# Patient Record
Sex: Male | Born: 1980 | Race: White | Hispanic: No | Marital: Single | Smoking: Current every day smoker
Health system: Southern US, Community
[De-identification: ages and names within clinical notes are randomized; demographics above are authoritative.]

## PROBLEM LIST (undated history)

## (undated) DIAGNOSIS — F319 Bipolar disorder, unspecified: Secondary | ICD-10-CM

## (undated) DIAGNOSIS — K859 Acute pancreatitis without necrosis or infection, unspecified: Secondary | ICD-10-CM

## (undated) DIAGNOSIS — F10231 Alcohol dependence with withdrawal delirium: Secondary | ICD-10-CM

## (undated) DIAGNOSIS — F10931 Alcohol use, unspecified with withdrawal delirium: Secondary | ICD-10-CM

## (undated) DIAGNOSIS — F1010 Alcohol abuse, uncomplicated: Secondary | ICD-10-CM

## (undated) DIAGNOSIS — F419 Anxiety disorder, unspecified: Secondary | ICD-10-CM

## (undated) HISTORY — PX: OTHER SURGICAL HISTORY: SHX169

---

## 2003-11-19 ENCOUNTER — Ambulatory Visit: Payer: Self-pay | Admitting: Internal Medicine

## 2006-09-17 ENCOUNTER — Emergency Department: Payer: Self-pay | Admitting: Unknown Physician Specialty

## 2014-07-31 ENCOUNTER — Inpatient Hospital Stay (HOSPITAL_COMMUNITY): Payer: MEDICAID

## 2014-07-31 ENCOUNTER — Emergency Department (HOSPITAL_COMMUNITY): Payer: Self-pay

## 2014-07-31 ENCOUNTER — Encounter (HOSPITAL_COMMUNITY): Payer: Self-pay | Admitting: Emergency Medicine

## 2014-07-31 ENCOUNTER — Inpatient Hospital Stay (HOSPITAL_COMMUNITY)
Admission: EM | Admit: 2014-07-31 | Discharge: 2014-08-07 | DRG: 438 | Disposition: A | Discharge status: Home / Self Care | Payer: Self-pay | Attending: Internal Medicine | Admitting: Internal Medicine

## 2014-07-31 DIAGNOSIS — F10939 Alcohol use, unspecified with withdrawal, unspecified: Secondary | ICD-10-CM | POA: Diagnosis present

## 2014-07-31 DIAGNOSIS — K852 Alcohol induced acute pancreatitis without necrosis or infection: Secondary | ICD-10-CM | POA: Diagnosis present

## 2014-07-31 DIAGNOSIS — J690 Pneumonitis due to inhalation of food and vomit: Secondary | ICD-10-CM | POA: Diagnosis present

## 2014-07-31 DIAGNOSIS — K859 Acute pancreatitis without necrosis or infection, unspecified: Secondary | ICD-10-CM

## 2014-07-31 DIAGNOSIS — K7030 Alcoholic cirrhosis of liver without ascites: Secondary | ICD-10-CM | POA: Diagnosis present

## 2014-07-31 DIAGNOSIS — R109 Unspecified abdominal pain: Secondary | ICD-10-CM

## 2014-07-31 DIAGNOSIS — E876 Hypokalemia: Secondary | ICD-10-CM | POA: Diagnosis not present

## 2014-07-31 DIAGNOSIS — F10239 Alcohol dependence with withdrawal, unspecified: Secondary | ICD-10-CM | POA: Diagnosis present

## 2014-07-31 DIAGNOSIS — J90 Pleural effusion, not elsewhere classified: Secondary | ICD-10-CM

## 2014-07-31 DIAGNOSIS — K567 Ileus, unspecified: Secondary | ICD-10-CM | POA: Diagnosis not present

## 2014-07-31 DIAGNOSIS — E872 Acidosis: Secondary | ICD-10-CM | POA: Diagnosis not present

## 2014-07-31 DIAGNOSIS — K625 Hemorrhage of anus and rectum: Secondary | ICD-10-CM

## 2014-07-31 DIAGNOSIS — F10231 Alcohol dependence with withdrawal delirium: Secondary | ICD-10-CM | POA: Diagnosis present

## 2014-07-31 DIAGNOSIS — R509 Fever, unspecified: Secondary | ICD-10-CM

## 2014-07-31 DIAGNOSIS — D649 Anemia, unspecified: Secondary | ICD-10-CM | POA: Diagnosis present

## 2014-07-31 DIAGNOSIS — D6959 Other secondary thrombocytopenia: Secondary | ICD-10-CM | POA: Diagnosis present

## 2014-07-31 DIAGNOSIS — R140 Abdominal distension (gaseous): Secondary | ICD-10-CM

## 2014-07-31 DIAGNOSIS — I10 Essential (primary) hypertension: Secondary | ICD-10-CM | POA: Diagnosis present

## 2014-07-31 DIAGNOSIS — R739 Hyperglycemia, unspecified: Secondary | ICD-10-CM | POA: Diagnosis present

## 2014-07-31 DIAGNOSIS — E871 Hypo-osmolality and hyponatremia: Secondary | ICD-10-CM

## 2014-07-31 DIAGNOSIS — K7010 Alcoholic hepatitis without ascites: Secondary | ICD-10-CM | POA: Diagnosis present

## 2014-07-31 DIAGNOSIS — K760 Fatty (change of) liver, not elsewhere classified: Secondary | ICD-10-CM | POA: Diagnosis present

## 2014-07-31 DIAGNOSIS — K921 Melena: Secondary | ICD-10-CM

## 2014-07-31 DIAGNOSIS — F1010 Alcohol abuse, uncomplicated: Secondary | ICD-10-CM

## 2014-07-31 DIAGNOSIS — G9340 Encephalopathy, unspecified: Secondary | ICD-10-CM

## 2014-07-31 DIAGNOSIS — F17210 Nicotine dependence, cigarettes, uncomplicated: Secondary | ICD-10-CM | POA: Diagnosis present

## 2014-07-31 LAB — ABO/RH: ABO/RH(D): O POS

## 2014-07-31 LAB — URINALYSIS, ROUTINE W REFLEX MICROSCOPIC
Glucose, UA: 100 mg/dL — AB
Ketones, ur: >80 mg/dL — AB
Nitrite: NEGATIVE
Specific Gravity, Urine: 1.035 — ABNORMAL HIGH (ref 1.005–1.030)
UROBILINOGEN UA: 0.2 mg/dL (ref 0.0–1.0)
pH: 5.5 (ref 5.0–8.0)

## 2014-07-31 LAB — CBC WITH DIFFERENTIAL/PLATELET
BASOS ABS: 0 10*3/uL (ref 0.0–0.1)
BASOS PCT: 0 % (ref 0–1)
EOS PCT: 0 % (ref 0–5)
Eosinophils Absolute: 0 10*3/uL (ref 0.0–0.7)
HCT: 41.2 % (ref 39.0–52.0)
Hemoglobin: 14.3 g/dL (ref 13.0–17.0)
LYMPHS PCT: 12 % (ref 12–46)
Lymphs Abs: 1 10*3/uL (ref 0.7–4.0)
MCH: 33.3 pg (ref 26.0–34.0)
MCHC: 34.7 g/dL (ref 30.0–36.0)
MCV: 96 fL (ref 78.0–100.0)
Monocytes Absolute: 0.9 10*3/uL (ref 0.1–1.0)
Monocytes Relative: 11 % (ref 3–12)
NEUTROS ABS: 6.5 10*3/uL (ref 1.7–7.7)
NEUTROS PCT: 77 % (ref 43–77)
PLATELETS: 174 10*3/uL (ref 150–400)
RBC: 4.29 MIL/uL (ref 4.22–5.81)
RDW: 13.8 % (ref 11.5–15.5)
WBC: 8.4 10*3/uL (ref 4.0–10.5)

## 2014-07-31 LAB — COMPREHENSIVE METABOLIC PANEL
ALT: 349 U/L — AB (ref 17–63)
AST: 303 U/L — AB (ref 15–41)
Albumin: 4.4 g/dL (ref 3.5–5.0)
Alkaline Phosphatase: 87 U/L (ref 38–126)
Anion gap: 19 — ABNORMAL HIGH (ref 5–15)
BUN: 5 mg/dL — ABNORMAL LOW (ref 6–20)
CHLORIDE: 91 mmol/L — AB (ref 101–111)
CO2: 22 mmol/L (ref 22–32)
Calcium: 9.7 mg/dL (ref 8.9–10.3)
Creatinine, Ser: 0.77 mg/dL (ref 0.61–1.24)
GFR calc Af Amer: >60 mL/min (ref >60)
GFR calc non Af Amer: >60 mL/min (ref >60)
GLUCOSE: 135 mg/dL — AB (ref 65–99)
Potassium: 4 mmol/L (ref 3.5–5.1)
SODIUM: 132 mmol/L — AB (ref 135–145)
Total Bilirubin: 1.1 mg/dL (ref 0.3–1.2)
Total Protein: 8.7 g/dL — ABNORMAL HIGH (ref 6.5–8.1)

## 2014-07-31 LAB — CBC
HCT: 39.2 % (ref 39.0–52.0)
Hemoglobin: 13.6 g/dL (ref 13.0–17.0)
MCH: 33.3 pg (ref 26.0–34.0)
MCHC: 34.7 g/dL (ref 30.0–36.0)
MCV: 95.8 fL (ref 78.0–100.0)
PLATELETS: 147 10*3/uL — AB (ref 150–400)
RBC: 4.09 MIL/uL — ABNORMAL LOW (ref 4.22–5.81)
RDW: 14.3 % (ref 11.5–15.5)
WBC: 9.7 10*3/uL (ref 4.0–10.5)

## 2014-07-31 LAB — CK: CK TOTAL: 179 U/L (ref 49–397)

## 2014-07-31 LAB — LACTATE DEHYDROGENASE: LDH: 320 U/L — ABNORMAL HIGH (ref 98–192)

## 2014-07-31 LAB — URINE MICROSCOPIC-ADD ON

## 2014-07-31 LAB — POC OCCULT BLOOD, ED: Fecal Occult Bld: POSITIVE — AB

## 2014-07-31 LAB — TYPE AND SCREEN
ABO/RH(D): O POS
Antibody Screen: NEGATIVE

## 2014-07-31 LAB — LACTIC ACID, PLASMA: Lactic Acid, Venous: 1.5 mmol/L (ref 0.5–2.0)

## 2014-07-31 LAB — AMYLASE: AMYLASE: 459 U/L — AB (ref 28–100)

## 2014-07-31 LAB — LIPASE, BLOOD: Lipase: 1542 U/L — ABNORMAL HIGH (ref 22–51)

## 2014-07-31 LAB — ALKALINE PHOSPHATASE: Alkaline Phosphatase: 62 U/L (ref 38–126)

## 2014-07-31 MED ORDER — ONDANSETRON HCL 4 MG/2ML IJ SOLN
4.0000 mg | Freq: Once | INTRAMUSCULAR | Status: AC
  Administered 2014-07-31: 4 mg via INTRAVENOUS
  Filled 2014-07-31: qty 2

## 2014-07-31 MED ORDER — OXYCODONE HCL 5 MG PO TABS: 5.0000 mg | ORAL_TABLET | ORAL | Status: DC | PRN

## 2014-07-31 MED ORDER — LORAZEPAM 1 MG PO TABS: 1.0000 mg | ORAL_TABLET | Freq: Four times a day (QID) | ORAL | Status: DC | PRN

## 2014-07-31 MED ORDER — LORAZEPAM 2 MG/ML IJ SOLN: 1.0000 mg | Freq: Four times a day (QID) | INTRAMUSCULAR | Status: DC | PRN

## 2014-07-31 MED ORDER — MORPHINE SULFATE 4 MG/ML IJ SOLN
4.0000 mg | Freq: Once | INTRAMUSCULAR | Status: AC
  Administered 2014-07-31: 4 mg via INTRAVENOUS
  Filled 2014-07-31: qty 1

## 2014-07-31 MED ORDER — LORAZEPAM 2 MG/ML IJ SOLN
1.0000 mg | INTRAMUSCULAR | Status: DC | PRN
  Administered 2014-08-01 – 2014-08-03 (×7): 2 mg via INTRAVENOUS
  Filled 2014-07-31 (×8): qty 1

## 2014-07-31 MED ORDER — VITAMIN B-1 100 MG PO TABS: 100.0000 mg | ORAL_TABLET | Freq: Every day | ORAL | Status: DC

## 2014-07-31 MED ORDER — LORAZEPAM 1 MG PO TABS: 0.0000 mg | ORAL_TABLET | Freq: Four times a day (QID) | ORAL | Status: DC

## 2014-07-31 MED ORDER — LORAZEPAM 2 MG/ML IJ SOLN
5.0000 mg | Freq: Once | INTRAMUSCULAR | Status: AC
  Administered 2014-07-31: 5 mg via INTRAVENOUS
  Filled 2014-07-31: qty 3

## 2014-07-31 MED ORDER — LORAZEPAM 2 MG/ML IJ SOLN: 0.0000 mg | Freq: Two times a day (BID) | INTRAMUSCULAR | Status: DC

## 2014-07-31 MED ORDER — LORAZEPAM 2 MG/ML IJ SOLN
4.0000 mg | Freq: Once | INTRAMUSCULAR | Status: AC
  Administered 2014-07-31: 4 mg via INTRAVENOUS
  Filled 2014-07-31: qty 2

## 2014-07-31 MED ORDER — VITAMIN B-1 100 MG PO TABS
100.0000 mg | ORAL_TABLET | Freq: Every day | ORAL | Status: DC
  Filled 2014-07-31 (×2): qty 1

## 2014-07-31 MED ORDER — SODIUM CHLORIDE 0.9 % IV SOLN: 250.0000 mL | INTRAVENOUS | Status: DC | PRN

## 2014-07-31 MED ORDER — HYDROMORPHONE HCL 1 MG/ML IJ SOLN
0.5000 mg | INTRAMUSCULAR | Status: DC | PRN
  Administered 2014-07-31: 1 mg via INTRAVENOUS
  Filled 2014-07-31: qty 1

## 2014-07-31 MED ORDER — SODIUM CHLORIDE 0.9 % IV SOLN
80.0000 mg | Freq: Once | INTRAVENOUS | Status: AC
  Administered 2014-07-31: 80 mg via INTRAVENOUS
  Filled 2014-07-31: qty 80

## 2014-07-31 MED ORDER — HYDROMORPHONE HCL 1 MG/ML IJ SOLN
1.0000 mg | Freq: Once | INTRAMUSCULAR | Status: AC
  Administered 2014-07-31: 1 mg via INTRAVENOUS
  Filled 2014-07-31: qty 1

## 2014-07-31 MED ORDER — ACETAMINOPHEN 650 MG RE SUPP
650.0000 mg | Freq: Four times a day (QID) | RECTAL | Status: DC | PRN
  Administered 2014-07-31: 650 mg via RECTAL
  Filled 2014-07-31: qty 1

## 2014-07-31 MED ORDER — LORAZEPAM 2 MG/ML IJ SOLN
2.0000 mg | INTRAMUSCULAR | Status: DC
  Administered 2014-07-31 (×3): 2 mg via INTRAVENOUS
  Filled 2014-07-31 (×4): qty 1

## 2014-07-31 MED ORDER — ALUM & MAG HYDROXIDE-SIMETH 200-200-20 MG/5ML PO SUSP: 30.0000 mL | Freq: Four times a day (QID) | ORAL | Status: DC | PRN

## 2014-07-31 MED ORDER — SODIUM CHLORIDE 0.9 % IV SOLN
Freq: Once | INTRAVENOUS | Status: AC
  Administered 2014-07-31: 06:00:00 via INTRAVENOUS

## 2014-07-31 MED ORDER — ACETAMINOPHEN 325 MG PO TABS: 650.0000 mg | ORAL_TABLET | Freq: Four times a day (QID) | ORAL | Status: DC | PRN

## 2014-07-31 MED ORDER — SODIUM CHLORIDE 0.9 % IV BOLUS (SEPSIS)
1000.0000 mL | Freq: Once | INTRAVENOUS | Status: AC
  Administered 2014-07-31: 1000 mL via INTRAVENOUS

## 2014-07-31 MED ORDER — LORAZEPAM 2 MG/ML IJ SOLN: 2.0000 mg | INTRAMUSCULAR | Status: DC

## 2014-07-31 MED ORDER — ONDANSETRON HCL 4 MG PO TABS
4.0000 mg | ORAL_TABLET | Freq: Four times a day (QID) | ORAL | Status: DC | PRN
  Administered 2014-08-06 (×2): 4 mg via ORAL
  Filled 2014-07-31 (×2): qty 1

## 2014-07-31 MED ORDER — SODIUM CHLORIDE 0.9 % IV SOLN
8.0000 mg/h | INTRAVENOUS | Status: DC
  Administered 2014-07-31 – 2014-08-01 (×3): 8 mg/h via INTRAVENOUS
  Filled 2014-07-31 (×7): qty 80

## 2014-07-31 MED ORDER — ADULT MULTIVITAMIN W/MINERALS CH
1.0000 | ORAL_TABLET | Freq: Every day | ORAL | Status: DC
  Filled 2014-07-31 (×2): qty 1

## 2014-07-31 MED ORDER — FOLIC ACID 1 MG PO TABS
1.0000 mg | ORAL_TABLET | Freq: Every day | ORAL | Status: DC
  Filled 2014-07-31 (×2): qty 1

## 2014-07-31 MED ORDER — PANTOPRAZOLE SODIUM 40 MG IV SOLR
40.0000 mg | Freq: Once | INTRAVENOUS | Status: AC
  Administered 2014-07-31: 40 mg via INTRAVENOUS
  Filled 2014-07-31: qty 40

## 2014-07-31 MED ORDER — LORAZEPAM 2 MG/ML IJ SOLN
1.0000 mg/h | INTRAVENOUS | Status: DC
  Filled 2014-07-31: qty 25

## 2014-07-31 MED ORDER — DEXMEDETOMIDINE HCL IN NACL 400 MCG/100ML IV SOLN
0.4000 ug/kg/h | INTRAVENOUS | Status: DC
  Administered 2014-07-31: 0.4 ug/kg/h via INTRAVENOUS
  Administered 2014-08-01: 0.5 ug/kg/h via INTRAVENOUS
  Administered 2014-08-02 (×3): 0.7 ug/kg/h via INTRAVENOUS
  Administered 2014-08-02: 0.3 ug/kg/h via INTRAVENOUS
  Administered 2014-08-03: 1.1 ug/kg/h via INTRAVENOUS
  Administered 2014-08-03: 1.2 ug/kg/h via INTRAVENOUS
  Administered 2014-08-03: 1 ug/kg/h via INTRAVENOUS
  Administered 2014-08-03: 1.2 ug/kg/h via INTRAVENOUS
  Administered 2014-08-03: 0.9 ug/kg/h via INTRAVENOUS
  Administered 2014-08-04 (×2): 1.2 ug/kg/h via INTRAVENOUS
  Administered 2014-08-04 (×2): 1.5 ug/kg/h via INTRAVENOUS
  Administered 2014-08-04: 1.2 ug/kg/h via INTRAVENOUS
  Administered 2014-08-04: 1.3 ug/kg/h via INTRAVENOUS
  Administered 2014-08-04: 1.1 ug/kg/h via INTRAVENOUS
  Administered 2014-08-04: 1 ug/kg/h via INTRAVENOUS
  Administered 2014-08-04: 1.2 ug/kg/h via INTRAVENOUS
  Administered 2014-08-05: 1.5 ug/kg/h via INTRAVENOUS
  Administered 2014-08-05: 2 ug/kg/h via INTRAVENOUS
  Administered 2014-08-05: 1.2 ug/kg/h via INTRAVENOUS
  Administered 2014-08-05: 1 ug/kg/h via INTRAVENOUS
  Administered 2014-08-05: 1.5 ug/kg/h via INTRAVENOUS
  Administered 2014-08-05: 2 ug/kg/h via INTRAVENOUS
  Filled 2014-07-31 (×27): qty 100

## 2014-07-31 MED ORDER — LORAZEPAM 1 MG PO TABS: 0.0000 mg | ORAL_TABLET | Freq: Two times a day (BID) | ORAL | Status: DC

## 2014-07-31 MED ORDER — SODIUM CHLORIDE 0.9 % IJ SOLN
3.0000 mL | Freq: Two times a day (BID) | INTRAMUSCULAR | Status: DC
  Administered 2014-07-31: 3 mL via INTRAVENOUS

## 2014-07-31 MED ORDER — THIAMINE HCL 100 MG/ML IJ SOLN: 100.0000 mg | Freq: Every day | INTRAMUSCULAR | Status: DC

## 2014-07-31 MED ORDER — THIAMINE HCL 100 MG/ML IJ SOLN
100.0000 mg | Freq: Every day | INTRAMUSCULAR | Status: DC
  Administered 2014-07-31: 100 mg via INTRAVENOUS
  Filled 2014-07-31 (×2): qty 1

## 2014-07-31 MED ORDER — DEXMEDETOMIDINE HCL IN NACL 200 MCG/50ML IV SOLN
0.2000 ug/kg/h | INTRAVENOUS | Status: DC
  Administered 2014-07-31: 0.2 ug/kg/h via INTRAVENOUS
  Filled 2014-07-31: qty 50

## 2014-07-31 MED ORDER — SODIUM CHLORIDE 0.9 % IV SOLN
INTRAVENOUS | Status: DC
Start: 2014-07-31 — End: 2014-08-03
  Administered 2014-07-31 – 2014-08-02 (×3): via INTRAVENOUS
  Administered 2014-08-02: 100 mL/h via INTRAVENOUS
  Administered 2014-08-02: 23:00:00 via INTRAVENOUS
  Administered 2014-08-03: 100 mL/h via INTRAVENOUS

## 2014-07-31 MED ORDER — SODIUM CHLORIDE 0.9 % IV SOLN
1.5000 g | Freq: Four times a day (QID) | INTRAVENOUS | Status: AC
  Administered 2014-07-31 – 2014-08-06 (×24): 1.5 g via INTRAVENOUS
  Filled 2014-07-31 (×24): qty 1.5

## 2014-07-31 MED ORDER — LORAZEPAM 2 MG/ML IJ SOLN
0.0000 mg | Freq: Four times a day (QID) | INTRAMUSCULAR | Status: DC
  Administered 2014-07-31: 4 mg via INTRAVENOUS
  Filled 2014-07-31: qty 2

## 2014-07-31 MED ORDER — LORAZEPAM 2 MG/ML IJ SOLN
2.0000 mg | INTRAMUSCULAR | Status: DC | PRN
  Administered 2014-07-31 (×3): 2 mg via INTRAVENOUS
  Filled 2014-07-31 (×3): qty 1

## 2014-07-31 MED ORDER — ONDANSETRON HCL 4 MG/2ML IJ SOLN
4.0000 mg | Freq: Four times a day (QID) | INTRAMUSCULAR | Status: DC | PRN
  Administered 2014-08-02: 4 mg via INTRAVENOUS
  Filled 2014-07-31 (×2): qty 2

## 2014-07-31 MED ORDER — HALOPERIDOL LACTATE 5 MG/ML IJ SOLN: 5.0000 mg | Freq: Once | INTRAMUSCULAR | Status: DC

## 2014-07-31 MED ORDER — SODIUM CHLORIDE 0.9 % IJ SOLN: 3.0000 mL | INTRAMUSCULAR | Status: DC | PRN

## 2014-07-31 MED ORDER — THIAMINE HCL 100 MG/ML IJ SOLN
Freq: Once | INTRAVENOUS | Status: AC
  Administered 2014-07-31: 08:00:00 via INTRAVENOUS
  Filled 2014-07-31: qty 1000

## 2014-07-31 MED ORDER — PANTOPRAZOLE SODIUM 40 MG IV SOLR: 40.0000 mg | Freq: Two times a day (BID) | INTRAVENOUS | Status: DC

## 2014-07-31 MED ORDER — LORAZEPAM 2 MG/ML IJ SOLN
0.0000 mg | Freq: Four times a day (QID) | INTRAMUSCULAR | Status: DC
Start: 2014-07-31 — End: 2014-07-31

## 2014-07-31 NOTE — ED Notes (Signed)
Report given to Liborio NixonJanice, RN on 2300-- [address].

## 2014-07-31 NOTE — ED Notes (Signed)
Attempting to climb out of bed.

## 2014-07-31 NOTE — H&P (Signed)
[facility] Admission History and Physical       Jerry Dunn ZOX:[id number] DOB: 23-Dec-1980 DOA: 07/31/2014  Referring physician: EDP PCP: No primary care provider on file.  Specialists:   Chief Complaint: ABD Pain  HPI: Jerry Dunn is a 34 y.o. male with  history of Alcohol Abuse who presents to the ED with complaints of Epigastric ABD Pain since 10 AM yesterday, with nausea and vomiting.  He  Reports that he began to pass bright red blood per rectum at 10 pm tonight..   He denies any hematemesis.    He drinks 1-1.5 pints of vodka each night, and reports that he last drank at 10 pm.   He was evaluated in the ED and was found to have a lipase level of 1542, and an initial hemoglobin of 14.3, and an FOBT was performed and was HEME positive.     He was referred for medical admission and started on an IV Protonix drip, and the CIWA protocol with IV Ativan.     Review of Systems: Constitutional: No Weight Loss, No Weight Gain, Night Sweats, Fevers, Chills, Dizziness, Light Headedness, Fatigue, or Generalized Weakness HEENT: No Headaches, Difficulty Swallowing,Tooth/Dental Problems,Sore Throat,  No Sneezing, Rhinitis, Ear Ache, Nasal Congestion, or Post Nasal Drip,  Cardio-vascular:  No Chest pain, Orthopnea, PND, Edema in Lower Extremities, Anasarca, Dizziness, Palpitations  Resp: No Dyspnea, No DOE, No Productive Cough, No Non-Productive Cough, No Hemoptysis, No Wheezing.    GI: No Heartburn, Indigestion, +Abdominal Pain, +Nausea,+Vomiting, Diarrhea, Constipation, Hematemesis, +Hematochezia, Melena, Change in Bowel Habits,  Loss of Appetite  GU: No Dysuria, No Change in Color of Urine, No Urgency or Urinary Frequency, No Flank pain.  Musculoskeletal: No Joint Pain or Swelling, No Decreased Range of Motion, No Back Pain.  Neurologic: No Syncope, No Seizures, Muscle Weakness, Paresthesia, Vision Disturbance or Loss, No Diplopia, No Vertigo, No Difficulty Walking,  Skin: No  Rash or Lesions. Psych: No Change in Mood or Affect, No Depression or Anxiety, No Memory loss, No Confusion, or Hallucinations   History reviewed. No pertinent past medical history.   History reviewed. No pertinent past surgical history.    Prior to Admission medications   Not on File     Allergies  Allergen Reactions  . Bee Venom Swelling    Social History:  reports that he has been smoking Cigarettes.  He has been smoking about 1.00 pack per day. He does not have any smokeless tobacco history on file. He reports that he drinks alcohol. He reports that he does not use illicit drugs.     No family history on file.     Physical Exam:  GEN:  Pleasant Well Nourished and Well Developed  34 y.o. Caucasian male examined and in no acute distress; cooperative with exam Filed Vitals:   07/31/14 0430 07/31/14 0445 07/31/14 0500 07/31/14 0538  BP: 135/101 141/88 129/89 137/97  Pulse: 100 102 102 107  Temp:      TempSrc:      Resp: SpO2: 99% 99% 98%    Blood pressure 137/97, pulse 107, temperature 98.9 F (37.2 C), temperature source Oral, resp. rate 30, SpO2 98 %. PSYCH: He is alert and oriented x4; does not appear anxious does not appear depressed; affect is normal HEENT: Normocephalic and Atraumatic, Mucous membranes pink; PERRLA; EOM intact; Fundi:  Benign;  No scleral icterus, Nares: Patent, Oropharynx: Clear, Fair Dentition,    Neck:  FROM, No Cervical Lymphadenopathy nor Thyromegaly or  Carotid Bruit; No JVD; Breasts:: Not examined CHEST WALL: No tenderness CHEST: Normal respiration, clear to auscultation bilaterally HEART: Regular rate and rhythm; no murmurs rubs or gallops BACK: No kyphosis or scoliosis; No CVA tenderness ABDOMEN: Positive Bowel Sounds, Soft Non-Tender, No Rebound or Guarding; No Masses, No Organomegaly. Rectal Exam: Not done EXTREMITIES: No Cyanosis, Clubbing, or Edema; No Ulcerations. Genitalia: not examined PULSES: 2+ and symmetric SKIN:  Normal hydration no rash or ulceration CNS:  Alert and Oriented x 4, No Focal Deficits Vascular: pulses palpable throughout    Labs on Admission:  Basic Metabolic Panel:  Recent Labs Lab 07/31/14 0356  NA 132*  K 4.0  CL 91*  CO2 22  GLUCOSE 135*  BUN 5*  CREATININE 0.77  CALCIUM 9.7   Liver Function Tests:  Recent Labs Lab 07/31/14 0356  AST 303*  ALT 349*  ALKPHOS 87  BILITOT 1.1  PROT 8.7*  ALBUMIN 4.4    Recent Labs Lab 07/31/14 0356  LIPASE 1542*   No results for input(s): AMMONIA in the last 168 hours. CBC:  Recent Labs Lab 07/31/14 0356  WBC 8.4  NEUTROABS 6.5  HGB 14.3  HCT 41.2  MCV 96.0  PLT 174   Cardiac Enzymes: No results for input(s): CKTOTAL, CKMB, CKMBINDEX, TROPONINI in the last 168 hours.  BNP (last 3 results) No results for input(s): BNP in the last 8760 hours.  ProBNP (last 3 results) No results for input(s): PROBNP in the last 8760 hours.  CBG: No results for input(s): GLUCAP in the last 168 hours.  Radiological Exams on Admission: Dg Chest Portable 1 View  07/31/2014   CLINICAL DATA:  Epigastric pain.  EXAM: PORTABLE CHEST - 1 VIEW  COMPARISON:  None.  FINDINGS: Linear density at the peripheral left base consistent with atelectasis. Lung volumes are mildly low. Normal heart size and mediastinal contours. There is no edema, consolidation, effusion, or pneumothorax. No acute osseous findings. No visible pneumoperitoneum or dilated bowel.  IMPRESSION: 1. Low lung volumes with minimal atelectasis. 2. Negative upper abdomen.   Electronically Signed   By: Marnee SpringJonathon  Watts M.D.   On: 07/31/2014 04:18   [place] Abdomen Limited Ruq  07/31/2014   CLINICAL DATA:  Pancreatitis  EXAM: US ABDOMEN LIMITED - RIGHT UPPER QUADRANT  COMPARISON:  None.  FINDINGS: Gallbladder:  No gallstones or wall thickening visualized. No sonographic Murphy sign noted.  Common bile duct:  Diameter: 4 mm. Where visualized, no filling defect.  Liver:  Large (right liver  tip extending below the lower pole right kidney) and echogenic liver with poor acoustic transmission. No focal lesion is seen. There is antegrade flow in the imaged portal venous system.  IMPRESSION: 1. Normal gallbladder. 2. Hepatic steatosis.   Electronically Signed   By: Marnee SpringJonathon  Watts M.D.   On: 07/31/2014 06:12     EKG: Independently reviewed.         Assessment/Plan:  34 y.o. male with  Active Problems:   1.    Alcoholic pancreatitis- US ABD reveals Hepatic Steatosis, Normal GallBladder, Hepatiomegaly   IV Protonix   Clear Liquids   Monitor Lipase     2.    Rectal bleeding   Check H/H   Consult GI In AM     3.    Alcohol withdrawal   CIWA Protocol     4.   Hyponatremia   IVFs   Monitor Na+      5.   DVT Prophylaxis   SCDs  Code Status:     FULL CODE        Family Communication:    No Family Present    Disposition Plan:    Inpatient  Status        Time spent:  56 Minutes      Ron Parker [facility] Pager [phone]   If 7AM -7PM Please Contact the Day Rounding Team MD for [facility]  If 7PM-7AM, Please Contact Night-Floor Coverage  [email or web address] Password [email or web address] 07/31/2014, 6:16 AM     ADDENDUM:   Patient was seen and examined on 07/31/2014

## 2014-07-31 NOTE — ED Notes (Addendum)
Pt reports abdominal pain and vomiting since yesterday at 10:30am.  Reports "red" diarrhea".  Pt reports he drinks alcohol daily, last drink being 2300 on 07/30/14.

## 2014-07-31 NOTE — ED Notes (Signed)
Pt sitting up in bed-- looking startled-- states "I need to go eat"-- re-oriented to hospital and surroundings, pt then states "This is so confusing"

## 2014-07-31 NOTE — ED Notes (Signed)
Pt to ultrasound

## 2014-07-31 NOTE — ED Notes (Signed)
Dr. Jenkins at bedside. 

## 2014-07-31 NOTE — ED Notes (Signed)
Pt pulling off oxygen, sitting up in bed, reaching in the air for something, encouraged to stay in bed, follows commands, disoriented to place/time

## 2014-07-31 NOTE — ED Notes (Signed)
Dr Robb Matarortiz made aware of pt's heart rate and CIWA. Orders received.

## 2014-07-31 NOTE — ED Notes (Addendum)
Pt reports that he has had abdominal pain since yesterday around 10:30am.  Reports constant nausea and vomiting, not being able to keep anything down.  Reports constant diarrhia that is red in color "probably b/c I have been straining."  Admits to drinking vodka daily w/ his last drink being 11pm last night.  Pt is tender and distended in left upper abdomen area.  Lips are cracked.  Bruising to left knee from pt report fall on Monday.

## 2014-07-31 NOTE — Consult Note (Signed)
PULMONARY / CRITICAL CARE MEDICINE   Name: Jerry Dunn MRN: [id number] DOB: 12-Oct-1980    ADMISSION DATE:  07/31/2014 CONSULTATION DATE:  07/31/2014  REFERRING MD :  David StallFeliz Ortiz  CHIEF COMPLAINT:  Agitation  INITIAL PRESENTATION:  34 y.o. M with ETOH abuse, admitted 07/31/14 with ETOH withdrawal and ETOH pancreatitis.  Later that afternoon, he had increasing agitation despite ativan, so PCCM called for consideration of transfer to ICU and initiation of precedex.  STUDIES:  CXR 7/6 >>> atx. RUQ US 7/6 >>> normal gallbladder, hepatic steatosis  SIGNIFICANT EVENTS: 7/6 - admitted with ETOH withdrawal and ETOH pancreatitis.  PCCM called later that afternoon for consideration precedex.  HISTORY OF PRESENT ILLNESS:  Pt is encephalopathic; therefore, this HPI is obtained from chart review. Jerry Dunn is a 34 y.o. M with PMH of ETOH abuse who [facility] hours 07/31/14 for epigastric abdominal pain that began at roughly 10 AM the morning prior.  He also reported that he passed one episode of BRBPR around 10PM the night prior.  He denied any hematemeses.  Reported that he drinks 1 - 1.5 pints of vodka each night and that his last drink was at 10PM the night prior when his episode of BRBPR occurred.  In ED, he had elevated lipase of 1542 and FOBT was positive.  Hgb was stable at 14.3  [facility] CIWA protocol as well as protonix gtt.  Throughout the day he had bouts of confusion and agitation.  On evening of 07/31/14, PCCM was consulted due to increasing agitation despite multiple doses of ativan (reportedly 15mg  in past 4 hours). He was started on ativan infusion, however, was still markedly agitated and confused. Nurses reports he is having hallucinations.    PAST MEDICAL HISTORY :   has no past medical history on file.  has no past surgical history on file. Prior to Admission medications   Medication Sig Start Date End Date Taking?  Authorizing Provider  alum & mag hydroxide-simeth (MAALOX PLUS) 400-400-40 MG/5ML suspension Take 5 mLs by mouth every 6 (six) hours as needed for indigestion.   Yes Historical Provider, MD  bismuth subsalicylate (PEPTO BISMOL) 262 MG/15ML suspension Take 30 mLs by mouth every 6 (six) hours as needed for indigestion or diarrhea or loose stools.   Yes Historical Provider, MD  ibuprofen (ADVIL,MOTRIN) 200 MG tablet Take 800 mg by mouth every 6 (six) hours as needed for mild pain, moderate pain or cramping.   Yes Historical Provider, MD  lamoTRIgine (LAMICTAL) 25 MG tablet Take 25 mg by mouth daily.   Yes Historical Provider, MD  penicillin v potassium (VEETID) 500 MG tablet Take 500 mg by mouth 4 (four) times daily.   Yes Historical Provider, MD  Probiotic Product (PROBIOTIC DAILY PO) Take 1 tablet by mouth daily.   Yes Historical Provider, MD  QUEtiapine (SEROQUEL) 50 MG tablet Take 50 mg by mouth at bedtime.   Yes Historical Provider, MD   Allergies  Allergen Reactions  . Bee Venom Swelling    FAMILY HISTORY:  No family history on file.  SOCIAL HISTORY:  reports that he has been smoking Cigarettes.  He has been smoking about 1.00 pack per day. He does not have any smokeless tobacco history on file. He reports that he drinks alcohol. He reports that he does not use illicit drugs.  REVIEW OF SYSTEMS:  Unable due to lethargy  SUBJECTIVE:   VITAL SIGNS: Temp:  [98.9  F (37.2 C)-101.3 F (38.5 C)] 99.6 F (37.6 C) (07/06 1932) Pulse Rate:  [100-150] 133 (07/06 1800) Resp:  [20-48] 32 (07/06 1800) BP: (129-151)/(84-112) 139/102 mmHg (07/06 1800) SpO2:  [95 %-100 %] 96 % (07/06 1800) Weight:  [92.4 kg (203 lb 11.3 oz)] 92.4 kg (203 lb 11.3 oz) (07/06 1044) HEMODYNAMICS:   VENTILATOR SETTINGS:   INTAKE / OUTPUT: Intake/Output      07/06 0701 - 07/07 0700   I.V. (mL/kg) 2089.6 (22.6)   IV Piggyback 50   Total Intake(mL/kg) 2139.6 (23.2)   Net +2139.6         PHYSICAL  EXAMINATION: General: Young male of normal body habitus in NAD Neuro: Lethargic, arouses to verbal, alert to self only. Admittedly confused. HEENT: [place]/AT, no JVD noted, PERRL Cardiovascular: Tachy, regular, no MRG Lungs: Clear bilateral breath sounds, tachypnea, shallow respirations Abdomen: Moderately distended, firm, diffusely tender Musculoskeletal: RRR, no MRG Skin: Grossly intact  LABS:  CBC  Recent Labs Lab 07/31/14 0356 07/31/14 1830  WBC 8.4 9.7  HGB 14.3 13.6  HCT 41.2 39.2  PLT 174 147*   Coag's No results for input(s): APTT, INR in the last 168 hours. BMET  Recent Labs Lab 07/31/14 0356  NA 132*  K 4.0  CL 91*  CO2 22  BUN 5*  CREATININE 0.77  GLUCOSE 135*   Electrolytes  Recent Labs Lab 07/31/14 0356  CALCIUM 9.7   Sepsis Markers No results for input(s): LATICACIDVEN, PROCALCITON, O2SATVEN in the last 168 hours. ABG No results for input(s): PHART, PCO2ART, PO2ART in the last 168 hours. Liver Enzymes  Recent Labs Lab 07/31/14 0356  AST 303*  ALT 349*  ALKPHOS 87  BILITOT 1.1  ALBUMIN 4.4   Cardiac Enzymes No results for input(s): TROPONINI, PROBNP in the last 168 hours. Glucose No results for input(s): GLUCAP in the last 168 hours.  Imaging Dg Chest Portable 1 View  07/31/2014   CLINICAL DATA:  Epigastric pain.  EXAM: PORTABLE CHEST - 1 VIEW  COMPARISON:  None.  FINDINGS: Linear density at the peripheral left base consistent with atelectasis. Lung volumes are mildly low. Normal heart size and mediastinal contours. There is no edema, consolidation, effusion, or pneumothorax. No acute osseous findings. No visible pneumoperitoneum or dilated bowel.  IMPRESSION: 1. Low lung volumes with minimal atelectasis. 2. Negative upper abdomen.   Electronically Signed   By: Marnee Spring M.D.   On: 07/31/2014 04:18   US Abdomen Limited Ruq  07/31/2014   CLINICAL DATA:  Pancreatitis  EXAM: US ABDOMEN LIMITED - RIGHT UPPER QUADRANT  COMPARISON:  None.   FINDINGS: Gallbladder:  No gallstones or wall thickening visualized. No sonographic Murphy sign noted.  Common bile duct:  Diameter: 4 mm. Where visualized, no filling defect.  Liver:  Large (right liver tip extending below the lower pole right kidney) and echogenic liver with poor acoustic transmission. No focal lesion is seen. There is antegrade flow in the imaged portal venous system.  IMPRESSION: 1. Normal gallbladder. 2. Hepatic steatosis.   Electronically Signed   By: Marnee Spring M.D.   On: 07/31/2014 06:12     ASSESSMENT / PLAN:  NEUROLOGIC A:   ETOH withdrawal with DT's - not responding to ativan.  P:   Transition to Precedex gtt PRN ativan for seizure/agitation RASS goal: 0 to -1. Thiamine / folate. Holding seroquel, lamictal  PULMONARY A: Suspected aspiration PNA  P:   Supplemental O2 as needed  CARDIOVASCULAR A:  Tachycardia, sinus in setting  withdrawal  P:  Tele Withdrawal therapies   RENAL A:   Hyponatremia - likely due to ETOH / beer potomania.  P:   Follow Bmet Gentle Volume   GASTROINTESTINAL A:   ETOH pancreatitis.  Abdominal pain, tenderness Transaminitis - ? ETOH cirrhosis. Hematochezia - 1 episode on night prior to admit (none since admission) P:   NPO Protonix gtt Pain control, IVF's. Monitor for further episodes of BRBPR.  HEMATOLOGIC A:   Mild thrombocytopenia - likely from underlying ETOH liver disease / cirrhosis. VTE prophylaxis P:  Monitor platelets.  INFECTIOUS A:   ETOH pancreatitis Possible aspiration PNA P:   Abx: Unasyn, start date 7/6 If CXR clear in AM would probably stop unasyn PCT algorithm to limit abx exposure.  ENDOCRINE A:   Mild hyperglycemia - no hx DM. P:   Monitor Start SSI if glucose consistently greater than 180   Joneen Roach, [facility] Pulmonology/Critical Care Pager [phone] or [phone]  07/31/2014 8:48 PM

## 2014-07-31 NOTE — ED Provider Notes (Signed)
CSN: [id number]     Arrival date & time 07/31/14  0328 History  This chart was scribed for Shon Baton, MD by Evon Slack, ED Scribe. This patient was seen in room A02C/A02C and the patient's care was started at 3:50 AM.      Chief Complaint  Patient presents with  . Abdominal Pain   Patient is a 34 y.o. male presenting with abdominal pain. The history is provided by the patient. No language interpreter was used.  Abdominal Pain Associated symptoms: diarrhea, nausea and vomiting   Associated symptoms: no chest pain, no dysuria, no fever and no shortness of breath    HPI Comments: Jerry Dunn is a 34 y.o. male who presents to the Emergency Department complaining of new severe epigastric abdominal pain onset 1 day prior. Pt states that he has associated vomiting, diarrhea and blood in his stool. Pt doesn't report any worsening factors. Current pain is 10 out of 10. Denies any history of similar pain in the past. Pt states that he has tried ibuprofen with no relief. Pt does report ETOH use daily. Pt states that he drink 1-1.5 pints per day for the past 3-4 years. Pt states that his last drink was 11 PM.   History reviewed. No pertinent past medical history. History reviewed. No pertinent past surgical history. No family history on file. History  Substance Use Topics  . Smoking status: Current Every Day Smoker -- 1.00 packs/day    Types: Cigarettes  . Smokeless tobacco: Not on file  . Alcohol Use: Yes     Comment: daily    Review of Systems  Constitutional: Negative.  Negative for fever.  Respiratory: Negative.  Negative for chest tightness and shortness of breath.   Cardiovascular: Negative.  Negative for chest pain.  Gastrointestinal: Positive for nausea, vomiting, abdominal pain, diarrhea and blood in stool.  Genitourinary: Negative.  Negative for dysuria.  Musculoskeletal: Negative for back pain.  Neurological: Negative for headaches.  All other systems reviewed and  are negative.   Allergies  Bee venom  Home Medications   Prior to Admission medications   Not on File   BP 137/97 mmHg  Pulse 107  Temp(Src) 98.9 F (37.2 C) (Oral)  Resp 30  SpO2 98%   Physical Exam  Constitutional: He is oriented to person, place, and time.  Ill-appearing but nontoxic, diaphoretic  HENT:  Head: Normocephalic and atraumatic.  Cardiovascular: Regular rhythm and normal heart sounds.   No murmur heard. Tachycardia  Pulmonary/Chest: Effort normal and breath sounds normal. No respiratory distress. He has no wheezes.  Abdominal: Soft. Bowel sounds are normal. There is tenderness. There is guarding. There is no rebound.  The gastric tenderness to palpation with voluntary guarding  Genitourinary: Rectum normal.  Gross blood per rectum  Musculoskeletal: He exhibits no edema.  Neurological: He is alert and oriented to person, place, and time.  Skin: Skin is warm and dry.  Psychiatric: He has a normal mood and affect.  Nursing note and vitals reviewed.   ED Course  Procedures (including critical care time)  CRITICAL CARE Performed by: Shon Baton   Total critical care time: 40 min  Critical care time was exclusive of separately billable procedures and treating other patients.  Critical care was necessary to treat or prevent imminent or life-threatening deterioration.  Critical care was time spent personally by me on the following activities: development of treatment plan with patient and/or surrogate as well as nursing, discussions with consultants, evaluation of  patient's response to treatment, examination of patient, obtaining history from patient or surrogate, ordering and performing treatments and interventions, ordering and review of laboratory studies, ordering and review of radiographic studies, pulse oximetry and re-evaluation of patient's condition.  DIAGNOSTIC STUDIES: Oxygen Saturation is 100% on RA, normal by my interpretation.     COORDINATION OF CARE: 3:55 AM-Discussed treatment plan with pt at bedside and pt agreed to plan.     Labs Review Labs Reviewed  COMPREHENSIVE METABOLIC PANEL - Abnormal; Notable for the following:    Sodium 132 (*)    Chloride 91 (*)    Glucose, Bld 135 (*)    BUN 5 (*)    Total Protein 8.7 (*)    AST 303 (*)    ALT 349 (*)    Anion gap 19 (*)    All other components within normal limits  LIPASE, BLOOD - Abnormal; Notable for the following:    Lipase 1542 (*)    All other components within normal limits  URINALYSIS, ROUTINE W REFLEX MICROSCOPIC (NOT AT [facility]) - Abnormal; Notable for the following:    Color, Urine ORANGE (*)    Specific Gravity, Urine 1.035 (*)    Glucose, UA 100 (*)    Hgb urine dipstick MODERATE (*)    Bilirubin Urine SMALL (*)    Ketones, ur >80 (*)    Protein, ur >300 (*)    Leukocytes, UA TRACE (*)    All other components within normal limits  URINE MICROSCOPIC-ADD ON - Abnormal; Notable for the following:    Casts HYALINE CASTS (*)    All other components within normal limits  POC OCCULT BLOOD, ED - Abnormal; Notable for the following:    Fecal Occult Bld POSITIVE (*)    All other components within normal limits  CBC WITH DIFFERENTIAL/PLATELET  HEMOGLOBIN AND HEMATOCRIT, BLOOD  HEMOGLOBIN AND HEMATOCRIT, BLOOD  HEMOGLOBIN AND HEMATOCRIT, BLOOD  TYPE AND SCREEN  ABO/RH    Imaging Review Dg Chest Portable 1 View  07/31/2014   CLINICAL DATA:  Epigastric pain.  EXAM: PORTABLE CHEST - 1 VIEW  COMPARISON:  None.  FINDINGS: Linear density at the peripheral left base consistent with atelectasis. Lung volumes are mildly low. Normal heart size and mediastinal contours. There is no edema, consolidation, effusion, or pneumothorax. No acute osseous findings. No visible pneumoperitoneum or dilated bowel.  IMPRESSION: 1. Low lung volumes with minimal atelectasis. 2. Negative upper abdomen.   Electronically Signed   By: Marnee Spring M.D.   On: 07/31/2014 04:18    US Abdomen Limited Ruq  07/31/2014   CLINICAL DATA:  Pancreatitis  EXAM: US ABDOMEN LIMITED - RIGHT UPPER QUADRANT  COMPARISON:  None.  FINDINGS: Gallbladder:  No gallstones or wall thickening visualized. No sonographic Murphy sign noted.  Common bile duct:  Diameter: 4 mm. Where visualized, no filling defect.  Liver:  Large (right liver tip extending below the lower pole right kidney) and echogenic liver with poor acoustic transmission. No focal lesion is seen. There is antegrade flow in the imaged portal venous system.  IMPRESSION: 1. Normal gallbladder. 2. Hepatic steatosis.   Electronically Signed   By: Marnee Spring M.D.   On: 07/31/2014 06:12     EKG Interpretation None      MDM   Final diagnoses:  Pancreatitis  Alcohol abuse  Bloody stool    Patient presents with abdominal pain. Associated nausea, vomiting, bloody stools. Ill-appearing but nontoxic on exam. Vital signs notable for tachycardia. He has  tenderness to palpation with voluntary guarding. No other signs of peritonitis. Patient was given pain and nausea medication. Given alcohol history patient was given IV protonic. No known history of varices. Upright chest x-ray shows no free air.  Lab work notable for a lipase of 1542, AST of 303, ALT of 349. White count is normal and hemoglobin is 14.3.  Suspect alcoholic pancreatitis. Patient also has evidence of rectal bleeding. Hemoglobin is normal at this time. This could be secondary to brisk upper GI bleed versus anal irritation secondary to diarrhea. We will defer to the hospitalist regarding immediate GI evaluation. Right upper quadrant ultrasound was ordered for completeness. Patient was placed on CIWA protocol given his alcohol abuse. He was given multiple doses of pain medication. On recheck, he is more comfortable but still in pain.  Will admit to the step down unit to the hospitalist.  I personally performed the services described in this documentation, which was scribed  in my presence. The recorded information has been reviewed and is accurate.      Shon Batonourtney F Horton, MD 07/31/14 [phone]

## 2014-07-31 NOTE — Progress Notes (Signed)
[facility] PROGRESS NOTE Assessment/Plan: DT/Alcohol withdraws: CIWA score high. I will start him on ativan scheduele plus CIWA protocol sedation score. Cont thiamine and folate.  Alcoholic pancreatitis: - NPO, start IV fluids. - cont narcotics for pain.  Rectal bleeding - no further bleeding, no previous Hbg to compare it with. Hbg is 14. - hydrate and check CBC this afternoon  Hyponatremia - likely pre-renal. - will cont IV hydration.    Code Status: full Family Communication: none  Disposition Plan: inpatient   Consultants:  none  Procedures:  CXR  Antibiotics:  none  HPI/Subjective: Pt has no complains but restless.  Objective: Filed Vitals:   07/31/14 1000 07/31/14 1044 07/31/14 1045 07/31/14 1100  BP: 131/94  150/103 144/105  Pulse: 141  138 135  Temp:   99.9 F (37.7 C)   TempSrc:   Oral   Resp: Height:   (1.753 m)    Weight:  92.4 kg (203 lb 11.3 oz)    SpO2: 95%  97% 96%    Intake/Output Summary (Last 24 hours) at 07/31/14 1141 Last data filed at 07/31/14 1100  Gross per 24 hour  Intake  64.58 ml  Output      0 ml  Net  64.58 ml   Filed Weights   07/31/14 1044  Weight: 92.4 kg (203 lb 11.3 oz)    Exam:  General: Alert, awake, oriented x3, in no acute distress.  HEENT: No bruits, no goiter.  Heart: Regular rate and rhythm, without murmurs, rubs, gallops.  Lungs: Good air movement, bilateral air movement.  Abdomen: Soft, nontender, nondistended, positive bowel sounds.  Neuro: Grossly intact, nonfocal.   Data Reviewed: Basic Metabolic Panel:  Recent Labs Lab 07/31/14 0356  NA 132*  K 4.0  CL 91*  CO2 22  GLUCOSE 135*  BUN 5*  CREATININE 0.77  CALCIUM 9.7   Liver Function Tests:  Recent Labs Lab 07/31/14 0356  AST 303*  ALT 349*  ALKPHOS 87  BILITOT 1.1  PROT 8.7*  ALBUMIN 4.4    Recent Labs Lab 07/31/14 0356  LIPASE 1542*   No results for input(s): AMMONIA in the last 168  hours. CBC:  Recent Labs Lab 07/31/14 0356  WBC 8.4  NEUTROABS 6.5  HGB 14.3  HCT 41.2  MCV 96.0  PLT 174   Cardiac Enzymes: No results for input(s): CKTOTAL, CKMB, CKMBINDEX, TROPONINI in the last 168 hours. BNP (last 3 results) No results for input(s): BNP in the last 8760 hours.  ProBNP (last 3 results) No results for input(s): PROBNP in the last 8760 hours.  CBG: No results for input(s): GLUCAP in the last 168 hours.  No results found for this or any previous visit (from the past 240 hour(s)).   Studies: Dg Chest Portable 1 View  07/31/2014   CLINICAL DATA:  Epigastric pain.  EXAM: PORTABLE CHEST - 1 VIEW  COMPARISON:  None.  FINDINGS: Linear density at the peripheral left base consistent with atelectasis. Lung volumes are mildly low. Normal heart size and mediastinal contours. There is no edema, consolidation, effusion, or pneumothorax. No acute osseous findings. No visible pneumoperitoneum or dilated bowel.  IMPRESSION: 1. Low lung volumes with minimal atelectasis. 2. Negative upper abdomen.   Electronically Signed   By: Marnee Spring M.D.   On: 07/31/2014 04:18   US Abdomen Limited Ruq  07/31/2014   CLINICAL DATA:  Pancreatitis  EXAM: US ABDOMEN LIMITED - RIGHT UPPER QUADRANT  COMPARISON:  None.  FINDINGS: Gallbladder:  No gallstones or wall thickening visualized. No sonographic Murphy sign noted.  Common bile duct:  Diameter: 4 mm. Where visualized, no filling defect.  Liver:  Large (right liver tip extending below the lower pole right kidney) and echogenic liver with poor acoustic transmission. No focal lesion is seen. There is antegrade flow in the imaged portal venous system.  IMPRESSION: 1. Normal gallbladder. 2. Hepatic steatosis.   Electronically Signed   By: Marnee SpringJonathon  Watts M.D.   On: 07/31/2014 06:12    Scheduled Meds: . folic acid  1 mg Oral Daily  . LORazepam  0-4 mg Intravenous Q6H   Followed by  . [START ON 08/02/2014] LORazepam  0-4 mg Intravenous Q12H  .  LORazepam  2 mg Intravenous Q2H  . multivitamin with minerals  1 tablet Oral Daily  . [START ON 08/03/2014] pantoprazole (PROTONIX) IV  40 mg Intravenous Q12H  . sodium chloride  3 mL Intravenous Q12H  . thiamine  100 mg Intravenous Daily  . thiamine  100 mg Oral Daily   Or  . thiamine  100 mg Intravenous Daily  . thiamine  100 mg Oral Daily   Continuous Infusions: . sodium chloride 100 mL/hr at 07/31/14 1100  . pantoprozole (PROTONIX) infusion 8 mg/hr (07/31/14 0825)    Time Spent: 25 min   Marinda ElkFELIZ ORTIZ, Nadene Witherspoon  [facility] Pager [phone]. If 7PM-7AM, please contact night-coverage at [email or web address], password [email or web address] 07/31/2014, 11:41 AM  LOS: 0 days

## 2014-07-31 NOTE — ED Notes (Signed)
Seizure pads placed on patient bed rails. 

## 2014-07-31 NOTE — ED Notes (Signed)
Spoke with house coverage regarding availability of step down bed.

## 2014-07-31 NOTE — Progress Notes (Addendum)
RN, Revonda StandardAllison, paged this NP secondary to after multiple doses of Ativan, this pt is still agitated, nervous with elevated BP/HR and CIWA score still over 20. Ativan drip has not been started. Haldol 5mg  IV now, restraints prn, and per pharmD, can titrate Ativan to as much as 9mg /hr. Will try this first and go to Precedex if unsuccessful. PCCM made aware of situation. Also, RN asking for Foley as pt is agitated and hasn't voided. Abd is distended.  KJKG, NP [facility] Update: NP went to bedside and Joneen RoachPaul Hoffman, NP for critical care was there doing consult. Foley returned about 800cc (see RN note). KUB ordered to assess distended abd. PCCM plan to start Precedex and d/c Ativan. PCCM assuming care. KJKG, NP [facility]

## 2014-08-01 ENCOUNTER — Inpatient Hospital Stay (HOSPITAL_COMMUNITY): Payer: Self-pay

## 2014-08-01 DIAGNOSIS — F10231 Alcohol dependence with withdrawal delirium: Secondary | ICD-10-CM

## 2014-08-01 LAB — MRSA PCR SCREENING: MRSA by PCR: NEGATIVE

## 2014-08-01 LAB — HEPATIC FUNCTION PANEL
ALBUMIN: 3 g/dL — AB (ref 3.5–5.0)
ALK PHOS: 60 U/L (ref 38–126)
ALT: 180 U/L — AB (ref 17–63)
AST: 133 U/L — ABNORMAL HIGH (ref 15–41)
Bilirubin, Direct: 0.5 mg/dL (ref 0.1–0.5)
Indirect Bilirubin: 0.8 mg/dL (ref 0.3–0.9)
TOTAL PROTEIN: 6.3 g/dL — AB (ref 6.5–8.1)
Total Bilirubin: 1.3 mg/dL — ABNORMAL HIGH (ref 0.3–1.2)

## 2014-08-01 LAB — BASIC METABOLIC PANEL
ANION GAP: 8 (ref 5–15)
BUN: 7 mg/dL (ref 6–20)
CO2: 21 mmol/L — ABNORMAL LOW (ref 22–32)
Calcium: 7.7 mg/dL — ABNORMAL LOW (ref 8.9–10.3)
Chloride: 101 mmol/L (ref 101–111)
Creatinine, Ser: 0.87 mg/dL (ref 0.61–1.24)
GFR calc non Af Amer: >60 mL/min (ref >60)
Glucose, Bld: 85 mg/dL (ref 65–99)
POTASSIUM: 3.9 mmol/L (ref 3.5–5.1)
SODIUM: 130 mmol/L — AB (ref 135–145)

## 2014-08-01 LAB — CBC
HEMATOCRIT: 35.8 % — AB (ref 39.0–52.0)
Hemoglobin: 12.1 g/dL — ABNORMAL LOW (ref 13.0–17.0)
MCH: 32.4 pg (ref 26.0–34.0)
MCHC: 33.8 g/dL (ref 30.0–36.0)
MCV: 95.7 fL (ref 78.0–100.0)
Platelets: 135 10*3/uL — ABNORMAL LOW (ref 150–400)
RBC: 3.74 MIL/uL — ABNORMAL LOW (ref 4.22–5.81)
RDW: 14.4 % (ref 11.5–15.5)
WBC: 8.6 10*3/uL (ref 4.0–10.5)

## 2014-08-01 LAB — LIPASE, BLOOD: LIPASE: 355 U/L — AB (ref 22–51)

## 2014-08-01 LAB — LACTIC ACID, PLASMA: Lactic Acid, Venous: 1.3 mmol/L (ref 0.5–2.0)

## 2014-08-01 MED ORDER — CHLORHEXIDINE GLUCONATE 0.12 % MT SOLN
15.0000 mL | Freq: Two times a day (BID) | OROMUCOSAL | Status: DC
  Administered 2014-08-01 – 2014-08-06 (×8): 15 mL via OROMUCOSAL
  Filled 2014-08-01 (×7): qty 15

## 2014-08-01 MED ORDER — CETYLPYRIDINIUM CHLORIDE 0.05 % MT LIQD
7.0000 mL | Freq: Two times a day (BID) | OROMUCOSAL | Status: DC
  Administered 2014-08-02 – 2014-08-05 (×7): 7 mL via OROMUCOSAL

## 2014-08-01 MED ORDER — PANTOPRAZOLE SODIUM 40 MG IV SOLR
40.0000 mg | Freq: Two times a day (BID) | INTRAVENOUS | Status: DC
  Administered 2014-08-01 – 2014-08-05 (×10): 40 mg via INTRAVENOUS
  Filled 2014-08-01 (×15): qty 40

## 2014-08-01 NOTE — Progress Notes (Signed)
eLink Physician-Brief Progress Note Patient Name: Jerry ElseWilliam Dunn DOB: July 14, 1980 MRN: [id number]   Date of Service  08/01/2014  HPI/Events of Note  Patient combative. ETOH withdrawal. Nurse requests bilateral wrist restraints.   eICU Interventions  Will order bilateral wrist restraints.     Intervention Category Minor Interventions: Agitation / anxiety - evaluation and management  [name] 08/01/2014, 12:40 AM

## 2014-08-01 NOTE — Progress Notes (Signed)
PULMONARY / CRITICAL CARE MEDICINE   Name: Jerry Dunn MRN: [id number] DOB: July 02, 1980    ADMISSION DATE:  07/31/2014 CONSULTATION DATE:  08/01/2014  REFERRING MD :  David StallFeliz Ortiz  CHIEF COMPLAINT:  Agitation  INITIAL PRESENTATION:  34 y.o. M with ETOH abuse, admitted 07/31/14 with ETOH withdrawal and ETOH pancreatitis.  Later that afternoon, he had increasing agitation despite ativan, so PCCM called for consideration of transfer to ICU and initiation of precedex.  STUDIES:  CXR 7/6 >>> atx. RUQ US 7/6 >>> normal gallbladder, hepatic steatosis  SIGNIFICANT EVENTS: 7/6 - admitted with ETOH withdrawal and ETOH pancreatitis.  PCCM called later that afternoon for precedex.    SUBJECTIVE:  Combative overnight, placed on precedex, improved  VITAL SIGNS: Temp:  [99.1 F (37.3 C)-101.4 F (38.6 C)] 99.1 F (37.3 C) (07/07 0700) Pulse Rate:  [78-150] 87 (07/07 0700) Resp:  [23-48] 30 (07/07 0700) BP: (76-152)/(51-111) 106/73 mmHg (07/07 0700) SpO2:  [90 %-99 %] 96 % (07/07 0700) Weight:  [203 lb 11.3 oz (92.4 kg)] 203 lb 11.3 oz (92.4 kg) (07/06 1044) HEMODYNAMICS:   VENTILATOR SETTINGS:   INTAKE / OUTPUT: Intake/Output      07/06 0701 - 07/07 0700 07/07 0701 - 07/08 0700   I.V. (mL/kg) 3717.5 (40.2)    IV Piggyback 100    Total Intake(mL/kg) 3817.5 (41.3)    Urine (mL/kg/hr) 840 (0.4) 65 (0.5)   Emesis/NG output 200 (0.1)    Stool 0 (0)    Total Output 1040 65   Net +2777.5 -65        Urine Occurrence 1 x    Stool Occurrence 1 x      PHYSICAL EXAMINATION: General: resting comfortably HENT: NCAT, OP clear Eyes: EOMi, anicteric PULM: CTA B CV: RRR, no mgr GI: distended abdomen, infrequent bowel sounds, nontender Ext: warm, no edema Derm: no bruising or edema Neuro: Sonorous but oriented to [facility], situation, maew  LABS:  CBC  Recent Labs Lab 07/31/14 0356 07/31/14 1830 08/01/14 0209  WBC 8.4 9.7 8.6  HGB 14.3 13.6 12.1*  HCT 41.2 39.2 35.8*  PLT  174 147* 135*   Coag's No results for input(s): APTT, INR in the last 168 hours. BMET  Recent Labs Lab 07/31/14 0356 08/01/14 0209  NA 132* 130*  K 4.0 3.9  CL 91* 101  CO2 22 21*  BUN 5* 7  CREATININE 0.77 0.87  GLUCOSE 135* 85   Electrolytes  Recent Labs Lab 07/31/14 0356 08/01/14 0209  CALCIUM 9.7 7.7*   Sepsis Markers  Recent Labs Lab 07/31/14 2052 07/31/14 2328  LATICACIDVEN 1.5 1.3   ABG No results for input(s): PHART, PCO2ART, PO2ART in the last 168 hours. Liver Enzymes  Recent Labs Lab 07/31/14 0356 07/31/14 2052 08/01/14 0209  AST 303*  --  133*  ALT 349*  --  180*  ALKPHOS 87 62 60  BILITOT 1.1  --  1.3*  ALBUMIN 4.4  --  3.0*   Cardiac Enzymes No results for input(s): TROPONINI, PROBNP in the last 168 hours. Glucose No results for input(s): GLUCAP in the last 168 hours.  Imaging Dg Abd Portable 1v  07/31/2014   CLINICAL DATA:  Diffuse abdominal pain and distention since yesterday. Nausea and vomiting. Rectal bleeding.  EXAM: PORTABLE ABDOMEN - 1 VIEW  COMPARISON:  None.  FINDINGS: The bowel gas pattern is normal. No radio-opaque calculi or other significant radiographic abnormality are seen.  IMPRESSION: Unremarkable bowel gas pattern.  No acute findings.  Electronically Signed   By: Myles Rosenthal M.D.   On: 07/31/2014 21:17     ASSESSMENT / PLAN:  NEUROLOGIC A:   ETOH withdrawal with DT's   P:   Continue Precedex gtt today, wean off as able PRN ativan for seizure/agitation RASS goal: 0 to -1 Thiamine / folate Holding seroquel, lamictal for now  PULMONARY A: No acute issues Aspiration risk P:   Supplemental O2 as needed NPO  CARDIOVASCULAR A:  Tachycardia, sinus in setting withdrawal > resolved P:  Tele Continue IVF  RENAL A:   Hyponatremia - mild P:   Follow Bmet Continue IVF with NS   GASTROINTESTINAL A:   ETOH pancreatitis > labs improving, vitals normal now Abdominal distension> likely ileus in setting of  pancreatitis Alcoholic hepatitis> improving Hematochezia - 1 episode on night prior to admit (none since admission) P:   NPO Change Protonix gtt to BID Pain control, IVF's KUB now Continue NG to low intermittent suction  HEMATOLOGIC A:   Mild thrombocytopenia - likely from underlying ETOH liver disease / cirrhosis VTE prophylaxis P:  Monitor for bleeding Daily CBC  INFECTIOUS A:   ETOH pancreatitis Possible aspiration PNA Suspect fever related to pancreatitis P:   Abx: Unasyn, start date 7/6 > likely d/c 7/8 Repeat CXR 7/8   ENDOCRINE A:   Mild hyperglycemia - no hx DM P:   Monitor Start SSI if glucose consistently greater than 180  Maintain in ICU for precedex My CC time 35 minutes  [place], MD [place] [facility] Pager: [phone] Cell: [phone] After 3pm or if no response, call [phone]   08/01/2014 8:22 AM

## 2014-08-02 ENCOUNTER — Inpatient Hospital Stay (HOSPITAL_COMMUNITY): Payer: Self-pay

## 2014-08-02 LAB — CBC WITH DIFFERENTIAL/PLATELET
BASOS ABS: 0 10*3/uL (ref 0.0–0.1)
Basophils Relative: 0 % (ref 0–1)
Eosinophils Absolute: 0.1 10*3/uL (ref 0.0–0.7)
Eosinophils Relative: 1 % (ref 0–5)
HCT: 34.9 % — ABNORMAL LOW (ref 39.0–52.0)
Hemoglobin: 11.9 g/dL — ABNORMAL LOW (ref 13.0–17.0)
LYMPHS ABS: 1.5 10*3/uL (ref 0.7–4.0)
Lymphocytes Relative: 15 % (ref 12–46)
MCH: 33.1 pg (ref 26.0–34.0)
MCHC: 34.1 g/dL (ref 30.0–36.0)
MCV: 97.2 fL (ref 78.0–100.0)
Monocytes Absolute: 0.7 10*3/uL (ref 0.1–1.0)
Monocytes Relative: 7 % (ref 3–12)
NEUTROS PCT: 77 % (ref 43–77)
Neutro Abs: 7.6 10*3/uL (ref 1.7–7.7)
Platelets: 122 10*3/uL — ABNORMAL LOW (ref 150–400)
RBC: 3.59 MIL/uL — AB (ref 4.22–5.81)
RDW: 14.2 % (ref 11.5–15.5)
WBC: 9.9 10*3/uL (ref 4.0–10.5)

## 2014-08-02 LAB — BASIC METABOLIC PANEL
Anion gap: 13 (ref 5–15)
BUN: 7 mg/dL (ref 6–20)
CALCIUM: 7.6 mg/dL — AB (ref 8.9–10.3)
CO2: 16 mmol/L — AB (ref 22–32)
CREATININE: 0.88 mg/dL (ref 0.61–1.24)
Chloride: 102 mmol/L (ref 101–111)
GFR calc Af Amer: >60 mL/min (ref >60)
GFR calc non Af Amer: >60 mL/min (ref >60)
GLUCOSE: 66 mg/dL (ref 65–99)
Potassium: 3.8 mmol/L (ref 3.5–5.1)
Sodium: 131 mmol/L — ABNORMAL LOW (ref 135–145)

## 2014-08-02 LAB — HEPATIC FUNCTION PANEL
ALBUMIN: 2.7 g/dL — AB (ref 3.5–5.0)
ALK PHOS: 69 U/L (ref 38–126)
ALT: 126 U/L — ABNORMAL HIGH (ref 17–63)
AST: 97 U/L — ABNORMAL HIGH (ref 15–41)
Bilirubin, Direct: 0.5 mg/dL (ref 0.1–0.5)
Indirect Bilirubin: 0.8 mg/dL (ref 0.3–0.9)
Total Bilirubin: 1.3 mg/dL — ABNORMAL HIGH (ref 0.3–1.2)
Total Protein: 6.1 g/dL — ABNORMAL LOW (ref 6.5–8.1)

## 2014-08-02 LAB — LIPASE, BLOOD: LIPASE: 86 U/L — AB (ref 22–51)

## 2014-08-02 MED ORDER — HYDRALAZINE HCL 20 MG/ML IJ SOLN
10.0000 mg | INTRAMUSCULAR | Status: DC | PRN
  Administered 2014-08-02 – 2014-08-05 (×8): 10 mg via INTRAVENOUS
  Filled 2014-08-02 (×9): qty 1

## 2014-08-02 MED ORDER — THIAMINE HCL 100 MG/ML IJ SOLN
100.0000 mg | Freq: Every day | INTRAMUSCULAR | Status: DC
  Administered 2014-08-02 – 2014-08-05 (×4): 100 mg via INTRAVENOUS
  Filled 2014-08-02 (×5): qty 1

## 2014-08-02 NOTE — Care Management Note (Signed)
Case Management Note  Patient Details  Name: Coral ElseWilliam Guynes MRN: [id number] Date of Birth: Aug 13, 1980  Subjective/Objective:          Pt's mother @ bedside, states pt sometimes lives with her, sometimes lives with girlfriend.  States he is not employed and has no insurance but that she will provide support to him as needed.                    Expected Discharge Plan:  Home/Self Care  In-House Referral:  Clinical Social Work  Discharge planning Services  CM Consult  Status of Service:  In process, will continue to follow   Magdalene RiverMayo, Tzivia Oneil T, RN 08/02/2014, 2:47 PM

## 2014-08-02 NOTE — Progress Notes (Signed)
PULMONARY / CRITICAL CARE MEDICINE   Name: Jerry Dunn MRN: [id number] DOB: [birth year]    ADMISSION DATE:  07/31/2014 CONSULTATION DATE:  08/02/2014  REFERRING MD :  David Stall  CHIEF COMPLAINT:  Agitation  INITIAL PRESENTATION:  34 y.o. M with ETOH abuse, admitted 07/31/14 with ETOH withdrawal and ETOH pancreatitis.  Later that afternoon, he had increasing agitation despite ativan, so PCCM called for consideration of transfer to ICU and initiation of precedex.  STUDIES:  CXR 7/6 >>> atx. RUQ [place] 7/6 >>> normal gallbladder, hepatic steatosis  SIGNIFICANT EVENTS: 7/6 - admitted with ETOH withdrawal and ETOH pancreatitis.  PCCM called later that afternoon for precedex.  SUBJECTIVE:  The patient had been weaned down further on his Precedex but became combative overnight. The patient's mother reported he was dreaming and disoriented. Nursing staff report he was combative and very verbal during this episode as well as one more episode earlier this morning. He was given prn Ativan last night and this morning which seemed to help significantly. His Precedex drip rate has also been increased. The patient denies any chest pain or pressure at this time. He continues to have abdominal pain but no nausea or emesis. He denies any flatulence or bowel movement either. The patient is somnolent but more cooperative at this time.  ROS:  Denies any fever, chills, or sweats. No dyspnea or cough.  VITAL SIGNS: Temp:  [98.4 F (36.9 C)-100.3 F (37.9 C)] 99 F (37.2 C) (07/08 1526) Pulse Rate:  [25-86] 75 (07/08 1500) Resp:  [18-38] 25 (07/08 1500) BP: (119-167)/(67-107) 166/104 mmHg (07/08 1524) SpO2:  [85 %-98 %] 97 % (07/08 1500) Weight:  [96.8 kg (213 lb 6.5 oz)] 96.8 kg (213 lb 6.5 oz) (07/08 0500) HEMODYNAMICS:   VENTILATOR SETTINGS:   INTAKE / OUTPUT: Intake/Output      07/07 0701 - 07/08 0700 07/08 0701 - 07/09 0700   I.V. (mL/kg) 1614 (16.7) 929.6 (9.6)   NG/GT 90    IV Piggyback 200  100   Total Intake(mL/kg) 1904 (19.7) 1029.6 (10.6)   Urine (mL/kg/hr) 2685 (1.2) 1525 (1.8)   Emesis/NG output 200 (0.1)    Stool     Total Output 2885 1525   Net -981 -495.4          PHYSICAL EXAMINATION: General: Sleeping in bed with mother at bedside. No acute distress. Does open eyes to voice stimulation. HEENT: No icterus or injection. Moist mucus membranes without oral ulcers. NGT in place. PULM: Good aeration & clear to auscultation bilaterally. Normal work of breathing on room air. CV: Regular rhythm. No edema. No appreciable JVD. GI: Slightly protuberant. No tenderness to palpation. Hypoactive bowel sounds. Integument:  Warm & dry. No rash or bruising on exposed skin. Neuro: Opens eyes to voice and follows commands. Moving all 4 extremities equally. Aware it is 2016 & that he is currently in [facility].  LABS:  CBC  Recent Labs Lab 07/31/14 1830 08/01/14 0209 08/02/14 0220  WBC 9.7 8.6 9.9  HGB 13.6 12.1* 11.9*  HCT 39.2 35.8* 34.9*  PLT 147* 135* 122*   Coag's No results for input(s): APTT, INR in the last 168 hours. BMET  Recent Labs Lab 07/31/14 0356 08/01/14 0209 08/02/14 0220  NA 132* 130* 131*  K 4.0 3.9 3.8  CL 91* 101 102  CO2 22 21* 16*  BUN 5* 7 7  CREATININE 0.77 0.87 0.88  GLUCOSE 135* 85 66   Electrolytes  Recent Labs Lab 07/31/14 0356 08/01/14 0209  08/02/14 0220  CALCIUM 9.7 7.7* 7.6*   Sepsis Markers  Recent Labs Lab 07/31/14 2052 07/31/14 2328  LATICACIDVEN 1.5 1.3   ABG No results for input(s): PHART, PCO2ART, PO2ART in the last 168 hours. Liver Enzymes  Recent Labs Lab 07/31/14 0356 07/31/14 2052 08/01/14 0209 08/02/14 0220  AST 303*  --  133* 97*  ALT 349*  --  180* 126*  ALKPHOS 87 62 60 69  BILITOT 1.1  --  1.3* 1.3*  ALBUMIN 4.4  --  3.0* 2.7*   Cardiac Enzymes No results for input(s): TROPONINI, PROBNP in the last 168 hours. Glucose No results for input(s): GLUCAP in the last 168  hours.  Imaging Dg Chest Port 1 View  08/02/2014   CLINICAL DATA:  Fever.  Pancreatitis.  EXAM: PORTABLE CHEST - 1 VIEW  COMPARISON:  07/31/2014  FINDINGS: NG tube is been inserted and the tip is in the fundus of the stomach. The patient now has moderate bilateral pleural effusions. Heart size is normal. Slight pulmonary vascular prominence. No osseous abnormality.  IMPRESSION: New moderate bilateral pleural effusions.   Electronically Signed   By: Francene BoyersJames  Maxwell M.D.   On: 08/02/2014 07:40     ASSESSMENT / PLAN:  NEUROLOGIC A:   ETOH withdrawal & DT's   P:   Continuing Precedex drip given further episodes of agitation & delirium Continuing Ativan PRN for seizure/agitation RASS goal: 0  Thiamine IV  PULMONARY A: Possible aspiration pneumonia vs pneumonitis Questionable bilateral pleural effusions on CXR  P:   Continuing Unasyn for now Repeat portable CXR in AM 7/9 NPO  CARDIOVASCULAR A:  Tachycardia, sinus in setting withdrawal > resolved Hypertension  P:  Continuing telemetry monitoring Hydralazine IV prn for SBP >165 or DBP >100  RENAL A:   Hyponatremia - mild & likely due to chronic EtOH use  P:   Follow daily Na on BMP Continue IVF with NS   GASTROINTESTINAL A:   ETOH pancreatitis > labs improving, vitals normal now Abdominal distension> likely ileus in setting of pancreatitis Alcoholic hepatitis> improving  Hematochezia - 1 episode on night prior to admit (none since admission)  P:   NPO Trending Pancreatitis with Lipase in AM on 7/9 Continuing NGT to lower, intermittent suction Continuing to trend transaminitis with daily hepatic function panel Continuing Protonix IV bid  HEMATOLOGIC A:   Mild thrombocytopenia - likely from underlying ETOH liver disease / cirrhosis Anemia - No active bleeding & no hematochezia or melena overnight  P:  Monitor for bleeding Trending platelets & Hgb with daily CBC  INFECTIOUS A:   Alcoholic  pancreatitis Possible aspiration PNA Fever secondary to pancreatitis vs aspiration pneumonitis/pneumonia   P:   Abx: Unasyn, start date 7/6 > stop date 7/12 Pan-Culture for any sign of fever  ENDOCRINE A:   Hyperglycemia - Likely in setting of pancreatitis without h/o diabetes mellitus  P:   Monitor with daily BMP Start SSI if glucose consistently greater than 180  Family Discussion:  Patient's mother was at the bedside today. I spent significant time updating her on his clinical progress with regards to his alcohol withdrawal and ongoing treatment with Precedex. Also given his continuing episodes of delirium and ongoing requirement for continuous infusion of Precedex he will remain in the ICU for further treatment & close monitoring.  I have spent a total of 38 minutes of critical care time today reviewing the patient's electronic medical record, updating his mother at bedside, and caring for the patient.  Donna Christen Jamison Neighbor, M.D. [facility] Pager:  [phone] After 3pm or if no response, call [phone]

## 2014-08-03 ENCOUNTER — Inpatient Hospital Stay (HOSPITAL_COMMUNITY): Payer: Self-pay

## 2014-08-03 LAB — HEPATIC FUNCTION PANEL
ALT: 108 U/L — AB (ref 17–63)
AST: 84 U/L — AB (ref 15–41)
Albumin: 2.5 g/dL — ABNORMAL LOW (ref 3.5–5.0)
Alkaline Phosphatase: 92 U/L (ref 38–126)
BILIRUBIN TOTAL: 1.5 mg/dL — AB (ref 0.3–1.2)
Bilirubin, Direct: 0.4 mg/dL (ref 0.1–0.5)
Indirect Bilirubin: 1.1 mg/dL — ABNORMAL HIGH (ref 0.3–0.9)
Total Protein: 6.4 g/dL — ABNORMAL LOW (ref 6.5–8.1)

## 2014-08-03 LAB — LIPASE, BLOOD: Lipase: 41 U/L (ref 22–51)

## 2014-08-03 LAB — CBC WITH DIFFERENTIAL/PLATELET
Basophils Absolute: 0 10*3/uL (ref 0.0–0.1)
Basophils Relative: 0 % (ref 0–1)
EOS PCT: 1 % (ref 0–5)
Eosinophils Absolute: 0.1 10*3/uL (ref 0.0–0.7)
HEMATOCRIT: 34.8 % — AB (ref 39.0–52.0)
Hemoglobin: 11.8 g/dL — ABNORMAL LOW (ref 13.0–17.0)
Lymphocytes Relative: 17 % (ref 12–46)
Lymphs Abs: 1.6 10*3/uL (ref 0.7–4.0)
MCH: 32.5 pg (ref 26.0–34.0)
MCHC: 33.9 g/dL (ref 30.0–36.0)
MCV: 95.9 fL (ref 78.0–100.0)
MONO ABS: 0.9 10*3/uL (ref 0.1–1.0)
MONOS PCT: 10 % (ref 3–12)
NEUTROS ABS: 7 10*3/uL (ref 1.7–7.7)
Neutrophils Relative %: 72 % (ref 43–77)
Platelets: 171 10*3/uL (ref 150–400)
RBC: 3.63 MIL/uL — AB (ref 4.22–5.81)
RDW: 14.2 % (ref 11.5–15.5)
WBC: 9.7 10*3/uL (ref 4.0–10.5)

## 2014-08-03 LAB — BASIC METABOLIC PANEL
Anion gap: 13 (ref 5–15)
BUN: 6 mg/dL (ref 6–20)
CHLORIDE: 107 mmol/L (ref 101–111)
CO2: 16 mmol/L — ABNORMAL LOW (ref 22–32)
CREATININE: 0.74 mg/dL (ref 0.61–1.24)
Calcium: 8.4 mg/dL — ABNORMAL LOW (ref 8.9–10.3)
GFR calc non Af Amer: >60 mL/min (ref >60)
Glucose, Bld: 82 mg/dL (ref 65–99)
POTASSIUM: 3.4 mmol/L — AB (ref 3.5–5.1)
SODIUM: 136 mmol/L (ref 135–145)

## 2014-08-03 LAB — PROCALCITONIN: Procalcitonin: 0.56 ng/mL

## 2014-08-03 MED ORDER — HALOPERIDOL LACTATE 5 MG/ML IJ SOLN
5.0000 mg | Freq: Once | INTRAMUSCULAR | Status: AC
  Administered 2014-08-03: 5 mg via INTRAVENOUS

## 2014-08-03 MED ORDER — DEXTROSE IN LACTATED RINGERS 5 % IV SOLN
INTRAVENOUS | Status: DC
  Administered 2014-08-03 – 2014-08-04 (×2): 50 mL/h via INTRAVENOUS

## 2014-08-03 MED ORDER — HALOPERIDOL LACTATE 5 MG/ML IJ SOLN
INTRAMUSCULAR | Status: AC
Start: 2014-08-03 — End: 2014-08-03
  Administered 2014-08-03: 5 mg via INTRAVENOUS
  Filled 2014-08-03: qty 1

## 2014-08-03 MED ORDER — OLANZAPINE 5 MG PO TBDP
5.0000 mg | ORAL_TABLET | Freq: Every day | ORAL | Status: DC
  Administered 2014-08-03: 5 mg via ORAL
  Filled 2014-08-03 (×2): qty 1

## 2014-08-03 MED ORDER — HALOPERIDOL LACTATE 5 MG/ML IJ SOLN
10.0000 mg | Freq: Once | INTRAMUSCULAR | Status: AC
  Administered 2014-08-03: 10 mg via INTRAVENOUS
  Filled 2014-08-03: qty 2

## 2014-08-03 MED ORDER — LORAZEPAM 2 MG/ML IJ SOLN
1.0000 mg | INTRAMUSCULAR | Status: DC | PRN
  Administered 2014-08-03 – 2014-08-04 (×4): 2 mg via INTRAVENOUS
  Filled 2014-08-03 (×5): qty 1

## 2014-08-03 MED ORDER — CLONIDINE HCL 0.2 MG/24HR TD PTWK
0.2000 mg | MEDICATED_PATCH | TRANSDERMAL | Status: DC
  Administered 2014-08-03: 0.2 mg via TRANSDERMAL
  Filled 2014-08-03: qty 1

## 2014-08-03 NOTE — Progress Notes (Signed)
eLink Physician-Brief Progress Note Patient Name: Jerry ElseWilliam Dunn DOB: 12-Mar-1980 MRN: [id number]   Date of Service  08/03/2014  HPI/Events of Note  Patient with continued agitation  eICU Interventions  Plan: 10 mg haldol IV Soft wrist and ankle restraints for patient safety     Intervention Category Major Interventions: Delirium, psychosis, severe agitation - evaluation and management  DETERDING,ELIZABETH 08/03/2014, 4:58 AM

## 2014-08-03 NOTE — Progress Notes (Addendum)
PULMONARY / CRITICAL CARE MEDICINE   Name: Jerry Dunn MRN: [id number] DOB: [birth year]    ADMISSION DATE:  07/31/2014 CONSULTATION DATE:  08/03/2014  REFERRING MD :  David Stall  CHIEF COMPLAINT:  Agitation  INITIAL PRESENTATION:  34 y.o. M with ETOH abuse, admitted 07/31/14 with ETOH withdrawal and ETOH pancreatitis.  Later that afternoon, he had increasing agitation despite ativan, so PCCM called for consideration of transfer to ICU and initiation of precedex.  STUDIES:  CXR 7/6 >>> atx. RUQ [place] 7/6 >>> normal gallbladder, hepatic steatosis  SIGNIFICANT EVENTS: 7/6 - admitted with ETOH withdrawal and ETOH pancreatitis.  PCCM called later that afternoon for precedex 7/7- attempting to wean precedex 7/8 - agitated. More precedex needed as well as haldol.  7/9    VITAL SIGNS: Temp:  [98.4 F (36.9 C)-99.4 F (37.4 C)] 99 F (37.2 C) (07/09 0700) Pulse Rate:  [59-86] 61 (07/09 0800) Resp:  [22-47] 25 (07/09 0800) BP: (114-167)/(74-110) 166/93 mmHg (07/09 0800) SpO2:  [93 %-100 %] 93 % (07/09 0800) HEMODYNAMICS:   VENTILATOR SETTINGS:   INTAKE / OUTPUT: Intake/Output      07/08 0701 - 07/09 0700 07/09 0701 - 07/10 0700   I.V. (mL/kg) 2843.6 (29.4) 127.7 (1.3)   NG/GT 30 30   IV Piggyback 200 50   Total Intake(mL/kg) 3073.6 (31.8) 207.7 (2.1)   Urine (mL/kg/hr) 4540 (2) 1150 (3.6)   Emesis/NG output 250 (0.1) 50 (0.2)   Total Output 4790 1200   Net -1716.4 -992.3          PHYSICAL EXAMINATION: General: Sleeping in bed with mother at bedside. No acute distress. Does open eyes to voice stimulation. Agitated at times HEENT: No icterus or injection. Moist mucus membranes without oral ulcers. NGT in place. PULM: Good aeration & clear to auscultation bilaterally. Normal work of breathing on room air. CV: Regular rhythm. No edema. No appreciable JVD. GI: Slightly protuberant. No tenderness to palpation. Hypoactive bowel sounds. Integument:  Warm & dry. No rash or  bruising on exposed skin. Neuro: Opens eyes to voice and follows commands. Moving all 4 extremities equally.  LABS:  CBC  Recent Labs Lab 08/01/14 0209 08/02/14 0220 08/03/14 0207  WBC 8.6 9.9 9.7  HGB 12.1* 11.9* 11.8*  HCT 35.8* 34.9* 34.8*  PLT 135* 122* 171   Coag's No results for input(s): APTT, INR in the last 168 hours. BMET  Recent Labs Lab 07/31/14 0356 08/01/14 0209 08/02/14 0220  NA 132* 130* 131*  K 4.0 3.9 3.8  CL 91* 101 102  CO2 22 21* 16*  BUN 5* 7 7  CREATININE 0.77 0.87 0.88  GLUCOSE 135* 85 66   Electrolytes  Recent Labs Lab 07/31/14 0356 08/01/14 0209 08/02/14 0220  CALCIUM 9.7 7.7* 7.6*   Sepsis Markers  Recent Labs Lab 07/31/14 2052 07/31/14 2328  LATICACIDVEN 1.5 1.3   ABG No results for input(s): PHART, PCO2ART, PO2ART in the last 168 hours. Liver Enzymes  Recent Labs Lab 08/01/14 0209 08/02/14 0220 08/03/14 0207  AST 133* 97* 84*  ALT 180* 126* 108*  ALKPHOS 60 69 92  BILITOT 1.3* 1.3* 1.5*  ALBUMIN 3.0* 2.7* 2.5*   Cardiac Enzymes No results for input(s): TROPONINI, PROBNP in the last 168 hours. Glucose No results for input(s): GLUCAP in the last 168 hours.  Imaging Dg Chest Port 1 View  08/03/2014   CLINICAL DATA:  Patient with history of pleural effusion.  EXAM: PORTABLE CHEST - 1 VIEW  COMPARISON:  Chest radiograph  08/02/2014  FINDINGS: Enteric tube courses inferior to the diaphragm. Monitoring leads overlie the patient. Stable enlarged cardiac and mediastinal contours. Low lung volumes. Unchanged mid and lower lung heterogeneous opacities. Small bilateral pleural effusions. Regional skeleton is unremarkable.  IMPRESSION: Layering bilateral pleural effusions with underlying pulmonary consolidation most compatible with atelectasis and or possible edema.  Cardiomegaly.   Electronically Signed   By: Annia Beltrew  Davis M.D.   On: 08/03/2014 10:08     ASSESSMENT / PLAN:  NEUROLOGIC A:   ETOH withdrawal & DT's   P:    Continuing Precedex drip given further episodes of agitation & delirium Ativan for seizure Only Add low dose clonidine RASS goal: 0   Thiamine IV Mother notes family sensitivity/adverse reaction to benzos Add SL zyprexa   PULMONARY A: Possible aspiration pneumonia vs pneumonitis Bilateral effusions/atx   P:   NPO Wean FIO2 Mobilize when able  Decrease IVFs See ID section   CARDIOVASCULAR A:  Tachycardia, sinus in setting withdrawal > resolved Hypertension  P:  Continuing telemetry monitoring Hydralazine IV prn for SBP >165 or DBP >100  RENAL A:   Hyponatremia - mild & likely due to chronic EtOH use stable Mild NAG acidosis   P:   Trend chem Change to D5LR  GASTROINTESTINAL A:   ETOH pancreatitis > labs improving, vitals normal now; lipase normalized as of 7/9 Abdominal distension> likely ileus in setting of pancreatitis Alcoholic hepatitis> improving  Hematochezia - 1 episode on night prior to admit (none since admission)  P:   NPO, clamp NGT. Try ice chips later today if MS improved Trending Pancreatitis with Lipase repeat 7/11 Continuing to trend transaminitis with every other day hepatic function panel Continuing Protonix IV bid   HEMATOLOGIC A:   Mild thrombocytopenia - likely from underlying ETOH liver disease / cirrhosis Anemia - No active bleeding & no hematochezia or melena overnight  P:  Monitor for bleeding Trending platelets & Hgb with daily CBC  INFECTIOUS A:   Alcoholic pancreatitis Possible aspiration PNA Fever secondary to pancreatitis vs aspiration pneumonitis/pneumonia   P:   Abx: Unasyn, start date 7/6 >>> PCT protocol (if neg stop abx) Pan-Culture for any sign of fever  ENDOCRINE A:   Hyperglycemia - Likely in setting of pancreatitis without h/o diabetes mellitus  P:   Monitor with daily BMP Start SSI if glucose consistently greater than 180     A little over sedated now. Will add low dose zyprexa as well as  clonidine. Hope we can decrease precedex dosing. Mom raised concern about benzos having adverse effect for other members of family. Wonder if this is a issue here. Will hold off on benzos and use for seizures only.   Simonne MartinetPeter E Babcock ACNP-BC [facility] Pulmonary[facility]Critical Care Pager # [phone] OR # [phone] if no answer     PCCM ATTENDING: I have reviewed pt's initial presentation, consultants notes and hospital database in detail.  The above assessment and plan was formulated under my direction. I saw the patient in conjunction with ACNP Tanja PortBabcock and have reviewed the above note in detail.  Billy Fischeravid Simonds, MD;  PCCM service; Mobile [phone]

## 2014-08-03 NOTE — Progress Notes (Signed)
eLink Physician-Brief Progress Note Patient Name: Jerry ElseWilliam Dunn DOB: [birth year] MRN: [id number]   Date of Service  08/03/2014  HPI/Events of Note  Patient on precedex and prn ativan for alcohol withdrawal.  Now more agitated despite max precedex and regular dosing of the ativan.  HD stable with adequate sats.  Camera check shows the patient alert in bed, not currently talking, but has been very delusional.  eICU Interventions  Plan: 1 Increase precedex to 1.2 max titration 2. Haldol dosing 3. [facility] if [facility] remains agitated.     Intervention Category Major Interventions: Delirium, psychosis, severe agitation - evaluation and management  DETERDING,ELIZABETH 08/03/2014, 3:01 AM

## 2014-08-04 LAB — COMPREHENSIVE METABOLIC PANEL
ALK PHOS: 111 U/L (ref 38–126)
ALT: 95 U/L — AB (ref 17–63)
AST: 70 U/L — AB (ref 15–41)
Albumin: 2.6 g/dL — ABNORMAL LOW (ref 3.5–5.0)
Anion gap: 11 (ref 5–15)
BUN: <5 mg/dL — ABNORMAL LOW (ref 6–20)
CALCIUM: 8.6 mg/dL — AB (ref 8.9–10.3)
CHLORIDE: 104 mmol/L (ref 101–111)
CO2: 18 mmol/L — AB (ref 22–32)
Creatinine, Ser: 0.52 mg/dL — ABNORMAL LOW (ref 0.61–1.24)
GFR calc Af Amer: >60 mL/min (ref >60)
GFR calc non Af Amer: >60 mL/min (ref >60)
GLUCOSE: 107 mg/dL — AB (ref 65–99)
POTASSIUM: 3.2 mmol/L — AB (ref 3.5–5.1)
Sodium: 133 mmol/L — ABNORMAL LOW (ref 135–145)
Total Bilirubin: 1.2 mg/dL (ref 0.3–1.2)
Total Protein: 6.6 g/dL (ref 6.5–8.1)

## 2014-08-04 LAB — CBC
HCT: 35.3 % — ABNORMAL LOW (ref 39.0–52.0)
HEMOGLOBIN: 12 g/dL — AB (ref 13.0–17.0)
MCH: 32.1 pg (ref 26.0–34.0)
MCHC: 34 g/dL (ref 30.0–36.0)
MCV: 94.4 fL (ref 78.0–100.0)
Platelets: 202 10*3/uL (ref 150–400)
RBC: 3.74 MIL/uL — AB (ref 4.22–5.81)
RDW: 14.1 % (ref 11.5–15.5)
WBC: 10.1 10*3/uL (ref 4.0–10.5)

## 2014-08-04 LAB — PROCALCITONIN: Procalcitonin: 1.27 ng/mL

## 2014-08-04 MED ORDER — LAMOTRIGINE 25 MG PO TABS
25.0000 mg | ORAL_TABLET | Freq: Every day | ORAL | Status: DC
  Administered 2014-08-04 – 2014-08-07 (×4): 25 mg via ORAL
  Filled 2014-08-04 (×5): qty 1

## 2014-08-04 MED ORDER — POTASSIUM CHLORIDE 10 MEQ/100ML IV SOLN
10.0000 meq | INTRAVENOUS | Status: AC
  Administered 2014-08-04 (×2): 10 meq via INTRAVENOUS
  Filled 2014-08-04 (×2): qty 100

## 2014-08-04 MED ORDER — CHLORDIAZEPOXIDE HCL 25 MG PO CAPS
50.0000 mg | ORAL_CAPSULE | Freq: Four times a day (QID) | ORAL | Status: DC
  Administered 2014-08-04 – 2014-08-05 (×8): 50 mg via ORAL
  Filled 2014-08-04 (×8): qty 2

## 2014-08-04 MED ORDER — POTASSIUM CHLORIDE 20 MEQ/15ML (10%) PO SOLN
40.0000 meq | Freq: Once | ORAL | Status: AC
  Administered 2014-08-04: 40 meq via ORAL
  Filled 2014-08-04 (×2): qty 30

## 2014-08-04 NOTE — Progress Notes (Signed)
eLink Physician-Brief Progress Note Patient Name: Jerry ElseWilliam Dunn DOB: 05-Dec-1980 MRN: [id number]   Date of Service  08/04/2014  HPI/Events of Note  K low  eICU Interventions  K IV supp      Intervention Category Major Interventions: Electrolyte abnormality - evaluation and management  Shan Levansatrick Wright 08/04/2014, 5:05 AM

## 2014-08-04 NOTE — Progress Notes (Signed)
PULMONARY / CRITICAL CARE MEDICINE   Name: Jerry Dunn MRN: [id number] DOB: May 07, 1980    ADMISSION DATE:  07/31/2014 CONSULTATION DATE:  08/04/2014  REFERRING MD :  David StallFeliz Ortiz  CHIEF COMPLAINT:  Agitation  INITIAL PRESENTATION:  34 y.o. M with ETOH abuse, admitted 07/31/14 with ETOH withdrawal and ETOH pancreatitis.  Later that afternoon, he had increasing agitation despite ativan, so PCCM called for consideration of transfer to ICU and initiation of precedex.  STUDIES:  CXR 7/6 >>> atx. RUQ US 7/6 >>> normal gallbladder, hepatic steatosis  SIGNIFICANT EVENTS: 7/6 - admitted with ETOH withdrawal and ETOH pancreatitis.  PCCM called later that afternoon for precedex 7/7- attempting to wean precedex 7/8 - agitated. More precedex needed as well as haldol.  7/9- added zyprexa, and clonidine 7/10 still agitated. Got 8mg  ativan over night. Abd softer. Added Librium taper. Precedex ceiling changed to 2 mcg. Asked nursing to ambulate. NGT removed. Ice and clears started.   VITAL SIGNS: Temp:  [98.2 F (36.8 C)-98.7 F (37.1 C)] 98.4 F (36.9 C) (07/10 0400) Pulse Rate:  [60-78] 73 (07/10 0700) Resp:  [20-41] 34 (07/10 0500) BP: (119-182)/(74-124) 182/109 mmHg (07/10 0700) SpO2:  [94 %-100 %] 99 % (07/10 0700) HEMODYNAMICS:   VENTILATOR SETTINGS:   INTAKE / OUTPUT: Intake/Output      07/09 0701 - 07/10 0700 07/10 0701 - 07/11 0700   I.V. (mL/kg) 1621.1 (16.7)    NG/GT 30    IV Piggyback 300    Total Intake(mL/kg) 1951.1 (20.2)    Urine (mL/kg/hr) 3725 (1.6)    Emesis/NG output 50 (0)    Total Output 3775     Net -1823.9            PHYSICAL EXAMINATION: General: restless. Not cooperative, but not really agitated HEENT: No icterus or injection. Moist mucus membranes without oral ulcers. NGT in place. PULM: increased RR. Clear. No accessory muscle use  CV: Regular rhythm. No edema. No appreciable JVD. GI: Slightly protuberant. No tenderness to palpation. Hypoactive  bowel sounds. Integument:  Warm & dry. No rash or bruising on exposed skin. Neuro: Opens eyes to voice and follows commands. Moving all 4 extremities equally. Oriented X 2 LABS:  CBC  Recent Labs Lab 08/02/14 0220 08/03/14 0207 08/04/14 0239  WBC 9.9 9.7 10.1  HGB 11.9* 11.8* 12.0*  HCT 34.9* 34.8* 35.3*  PLT 122* 171 202   Coag's No results for input(s): APTT, INR in the last 168 hours. BMET  Recent Labs Lab 08/02/14 0220 08/03/14 1250 08/04/14 0239  NA 131* 136 133*  K 3.8 3.4* 3.2*  CL 102 107 104  CO2 16* 16* 18*  BUN 7 6 <5*  CREATININE 0.88 0.74 0.52*  GLUCOSE 66 82 107*   Electrolytes  Recent Labs Lab 08/02/14 0220 08/03/14 1250 08/04/14 0239  CALCIUM 7.6* 8.4* 8.6*   Sepsis Markers  Recent Labs Lab 07/31/14 2052 07/31/14 2328 08/03/14 1250 08/04/14 0239  LATICACIDVEN 1.5 1.3  --   --   PROCALCITON  --   --  0.56 1.27   ABG No results for input(s): PHART, PCO2ART, PO2ART in the last 168 hours. Liver Enzymes  Recent Labs Lab 08/02/14 0220 08/03/14 0207 08/04/14 0239  AST 97* 84* 70*  ALT 126* 108* 95*  ALKPHOS 69 92 111  BILITOT 1.3* 1.5* 1.2  ALBUMIN 2.7* 2.5* 2.6*   Cardiac Enzymes No results for input(s): TROPONINI, PROBNP in the last 168 hours. Glucose No results for input(s): GLUCAP in the last  168 hours.  Imaging No results found.   ASSESSMENT / PLAN:  NEUROLOGIC A:   ETOH withdrawal & DT's   P:   Continuing Precedex drip given further episodes of agitation & delirium (change ceiling to 2 mcg) Starting Librium taper. Will start 50 mg qid, once precedex off can decrease to 25 mg QID, then slowly taper  Added low dose clonidine RASS goal: 0   Thiamine IV Ambulate   PULMONARY A: Possible aspiration pneumonia vs pneumonitis Bilateral effusions/atx   P:   NPO Wean FIO2 Mobilize  Decreased IVFs See ID section   CARDIOVASCULAR A:  Tachycardia, sinus in setting withdrawal > resolved Hypertension  P:   Continuing telemetry monitoring Hydralazine IV prn for SBP >165 or DBP >100  RENAL A:   Hyponatremia - mild & likely due to chronic EtOH use stable Mild NAG acidosis -->improved  Hypokalemia  P:   Trend chem Cont D5LR Replace K  GASTROINTESTINAL A:   ETOH pancreatitis > labs improving, vitals normal now; lipase normalized as of 7/9 Abdominal distension> likely ileus in setting of pancreatitis Alcoholic hepatitis> improving  Hematochezia - 1 episode on night prior to admit (none since admission)  P:   D/c NGT Start w/ ice chips and adv w/ clears  Trending Pancreatitis with Lipase repeat 7/11 Continuing to trend transaminitis with every other day hepatic function panel Continuing Protonix IV bid   HEMATOLOGIC A:   Mild thrombocytopenia - likely from underlying ETOH liver disease / cirrhosis Anemia - No active bleeding & no hematochezia or melena overnight  P:  Monitor for bleeding Trending platelets & Hgb with daily CBC  INFECTIOUS A:   Alcoholic pancreatitis Possible aspiration PNA Fever secondary to pancreatitis vs aspiration pneumonitis/pneumonia   P:   Abx: Unasyn, start date 7/6 >>> PCT protocol to a/w abx stop date  Pan-Culture for any sign of fever  ENDOCRINE A:   Hyperglycemia - Likely in setting of pancreatitis without h/o diabetes mellitus  P:   Monitor with daily BMP Start SSI if glucose consistently greater than 180   Still agitated. Got total of  ativan last night. Not much progress today. His delirium has been refractory thus far. Will try Librium taper. Start  qid, then decrease to  qid after off precedex.   Simonne Martinet ACNP-BC [facility] Pulmonary/Critical Care Pager # [phone] OR # [phone] if no answer     PCCM ATTENDING: I have evaluated with ACNP Tanja Port and reviewed pt's hospital database in detail. The above assessment and plan was formulated under my direction.  In summary: Looks much better, calmer. Remains on  dexmedetomidine. Starting on liq diet today. We have initiated librium in hopes that he can be weaned off dex and transferred out of ICU    Billy Fischer, MD;  PCCM service; Mobile [phone]

## 2014-08-04 NOTE — Progress Notes (Signed)
   08/04/14 1856  Vitals  BP (!) 172/91 mmHg  Hydralzine 10 mg IV for continued sbp > 160/.

## 2014-08-05 LAB — PROCALCITONIN: PROCALCITONIN: 0.32 ng/mL

## 2014-08-05 LAB — BASIC METABOLIC PANEL
ANION GAP: 13 (ref 5–15)
BUN: <5 mg/dL — ABNORMAL LOW (ref 6–20)
CALCIUM: 8.9 mg/dL (ref 8.9–10.3)
CHLORIDE: 104 mmol/L (ref 101–111)
CO2: 21 mmol/L — ABNORMAL LOW (ref 22–32)
CREATININE: 0.51 mg/dL — AB (ref 0.61–1.24)
GLUCOSE: 111 mg/dL — AB (ref 65–99)
Potassium: 3.2 mmol/L — ABNORMAL LOW (ref 3.5–5.1)
Sodium: 138 mmol/L (ref 135–145)

## 2014-08-05 MED ORDER — POTASSIUM CHLORIDE CRYS ER 20 MEQ PO TBCR: 30.0000 meq | EXTENDED_RELEASE_TABLET | ORAL | Status: DC

## 2014-08-05 MED ORDER — POTASSIUM CHLORIDE 10 MEQ/100ML IV SOLN
10.0000 meq | INTRAVENOUS | Status: AC
  Administered 2014-08-05 (×4): 10 meq via INTRAVENOUS
  Filled 2014-08-05 (×3): qty 100

## 2014-08-05 NOTE — Progress Notes (Signed)
[facility]OC[facility] FOR AM LAB REPLACEMENT ONLY  The [facility] does [facility] Protocol based on the criteria listed below:   1. Is GFR >/= 40 ml/min? Yes.    Patient's GFR today is >60 2. Is urine output >/= 0.5 ml/kg/hr for the last 6 hours? Yes.   Patient's UOP is 1.4 ml/kg/hr 3. Is BUN < 60 mg/dL? Yes.    Patient's BUN today is <5 4. Abnormal electrolyte(s): K 3.2 5. Ordered repletion with: per protocol 6. If a panic level lab has been reported, has the CCM MD in charge been notified? No..   Physician:    Markus DaftWHELAN, [place] List A 08/05/2014 6:02 AM

## 2014-08-05 NOTE — Progress Notes (Signed)
PULMONARY / CRITICAL CARE MEDICINE   Name: Jerry Dunn MRN: [id number] DOB: Jun 16, 1980    ADMISSION DATE:  07/31/2014 CONSULTATION DATE:  08/05/2014  REFERRING MD :  David Stall  CHIEF COMPLAINT:  Agitation  INITIAL PRESENTATION:  34 y.o. M with ETOH abuse, admitted 07/31/14 with ETOH withdrawal and ETOH pancreatitis.  Later that afternoon, he had increasing agitation despite ativan, so PCCM called for consideration of transfer to ICU and initiation of precedex.  STUDIES:  CXR 7/6 >>> atx. RUQ [place] 7/6 >>> normal gallbladder, hepatic steatosis  SIGNIFICANT EVENTS: 7/6 - admitted with ETOH withdrawal and ETOH pancreatitis.  PCCM called later that afternoon for precedex 7/7- attempting to wean precedex 7/8 - agitated. More precedex needed as well as haldol.  7/9- added zyprexa, and clonidine 7/10 still agitated. Got  ativan over night. Abd softer. Added Librium taper. Precedex ceiling changed to 2 mcg. Asked nursing to ambulate. NGT removed. Ice and clears started.   INTERVAL EVENTS:  RN reports that pt did have some agitation, was kicking staff last night He is in soft wrist restraints currently Some difficulty getting him to take PO including librium   VITAL SIGNS: Temp:  [97.8 F (36.6 C)-98.7 F (37.1 C)] 98.7 F (37.1 C) (07/11 0000) Pulse Rate:  [51-82] 67 (07/11 0625) Resp:  [9-39] 22 (07/11 0500) BP: (90-179)/(57-114) 137/81 mmHg (07/11 0625) SpO2:  [97 %-100 %] 100 % (07/11 0625) HEMODYNAMICS:   VENTILATOR SETTINGS:   INTAKE / OUTPUT: Intake/Output      07/10 0701 - 07/11 0700 07/11 0701 - 07/12 0700   P.O. 510    I.V. (mL/kg) 1138.2 (11.8)    NG/GT 80    IV Piggyback 250    Total Intake(mL/kg) 1978.2 (20.4)    Urine (mL/kg/hr) 3680 (1.6)    Emesis/NG output     Total Output 3680     Net -1701.9            PHYSICAL EXAMINATION: General: sleeping, in restraints HEENT: No icterus or injection. Moist mucus membranes, some UA secretions that he is  able to clear PULM: clear B, no crackles or wheezes  CV: Regular rhythm. No edema. No appreciable JVD. GI: Slightly protuberant. No tenderness to palpation. Hypoactive bowel sounds. Integument:  Warm & dry. No rash or bruising on exposed skin. Neuro: somnolent, difficult to rouse on current meds LABS:  CBC  Recent Labs Lab 08/02/14 0220 08/03/14 0207 08/04/14 0239  WBC 9.9 9.7 10.1  HGB 11.9* 11.8* 12.0*  HCT 34.9* 34.8* 35.3*  PLT 122* 171 202   Coag's No results for input(s): APTT, INR in the last 168 hours. BMET  Recent Labs Lab 08/03/14 1250 08/04/14 0239 08/05/14 0315  NA 136 133* 138  K 3.4* 3.2* 3.2*  CL 107 104 104  CO2 16* 18* 21*  BUN 6 <5* <5*  CREATININE 0.74 0.52* 0.51*  GLUCOSE 82 107* 111*   Electrolytes  Recent Labs Lab 08/03/14 1250 08/04/14 0239 08/05/14 0315  CALCIUM 8.4* 8.6* 8.9   Sepsis Markers  Recent Labs Lab 07/31/14 2052 07/31/14 2328 08/03/14 1250 08/04/14 0239 08/05/14 0315  LATICACIDVEN 1.5 1.3  --   --   --   PROCALCITON  --   --  0.56 1.27 0.32   ABG No results for input(s): PHART, PCO2ART, PO2ART in the last 168 hours. Liver Enzymes  Recent Labs Lab 08/02/14 0220 08/03/14 0207 08/04/14 0239  AST 97* 84* 70*  ALT 126* 108* 95*  ALKPHOS 69 92 111  BILITOT 1.3* 1.5* 1.2  ALBUMIN 2.7* 2.5* 2.6*   Cardiac Enzymes No results for input(s): TROPONINI, PROBNP in the last 168 hours. Glucose No results for input(s): GLUCAP in the last 168 hours.  Imaging No results found.   ASSESSMENT / PLAN:  NEUROLOGIC A:   ETOH withdrawal & DT's  Oversedation 7/11   P:   Need to wean Precedex drip 7/11 as he is quite sedated. Goal wean to point where he is awake enough to take PO and take the librium reliably  Starting Librium taper. Continue 50 mg qid 7/11, once precedex off can decrease to 25 mg QID, then slowly taper  Continue medium dose clonidine RASS goal: 0   Thiamine IV Ambulate   PULMONARY A: Possible  aspiration pneumonia vs pneumonitis Bilateral effusions/atx   P:   Mobilize  Decreased IVFs See ID section   CARDIOVASCULAR A:  Tachycardia, sinus in setting withdrawal > resolved Hypertension, resolved  P:  Continuing telemetry monitoring Hydralazine IV prn for SBP >165 or DBP >100 Clonidine   RENAL A:   Hyponatremia - mild & likely due to chronic EtOH use, stable Mild NAG acidosis -->improved  Hypokalemia  P:   Trend chem Cont D5LR, decrease 7/11 Replace K (done IV 7/11)   GASTROINTESTINAL A:   ETOH pancreatitis > labs improving, vitals normal; lipase normalized as of 7/9 Abdominal distension> likely ileus in setting of pancreatitis Alcoholic hepatitis> improved Hematochezia - 1 episode on night prior to admit (none since admission)  P:   Clear diet, advance as able Recheck lipase if any abd pain once on a PO diet Continuing to trend transaminitis  Continuing Protonix IV bid   HEMATOLOGIC A:   Mild thrombocytopenia - likely from underlying ETOH liver disease / cirrhosis Anemia - No active bleeding & no hematochezia or melena overnight  P:  Monitor for bleeding Follow CBC intermittently   INFECTIOUS A:   Alcoholic pancreatitis Possible aspiration PNA Fever secondary to pancreatitis vs aspiration pneumonitis/pneumonia   P:   Abx: Unasyn, start date 7/6 >>> PCT protocol to a/w abx stop date  Pan-Culture for any sign of fever  Day 6 of 8 abx on 7/11  ENDOCRINE A:   Hyperglycemia - Likely in setting of pancreatitis without h/o diabetes mellitus  P:   Monitor with daily BMP Start SSI if glucose consistently greater than 180  SUMMARY EtOH withdrawal, pancreatitis, probable aspiration PNA. Balancing sedation has been challenging and now he has swung to the side of oversedation 7/11. Will try to slowly wean precedex, move to librium only.    Independent CC time 35 minutes   Levy Pupaobert Byrum, MD, PhD 08/05/2014, 8:58 AM [place] [facility] [phone] or if no answer [phone]

## 2014-08-06 LAB — CBC
HEMATOCRIT: 34.5 % — AB (ref 39.0–52.0)
Hemoglobin: 12 g/dL — ABNORMAL LOW (ref 13.0–17.0)
MCH: 32.7 pg (ref 26.0–34.0)
MCHC: 34.8 g/dL (ref 30.0–36.0)
MCV: 94 fL (ref 78.0–100.0)
PLATELETS: 283 10*3/uL (ref 150–400)
RBC: 3.67 MIL/uL — AB (ref 4.22–5.81)
RDW: 14.4 % (ref 11.5–15.5)
WBC: 8.1 10*3/uL (ref 4.0–10.5)

## 2014-08-06 LAB — BASIC METABOLIC PANEL
ANION GAP: 11 (ref 5–15)
BUN: <5 mg/dL — ABNORMAL LOW (ref 6–20)
CALCIUM: 9.1 mg/dL (ref 8.9–10.3)
CHLORIDE: 101 mmol/L (ref 101–111)
CO2: 25 mmol/L (ref 22–32)
Creatinine, Ser: 0.73 mg/dL (ref 0.61–1.24)
GFR calc Af Amer: >60 mL/min (ref >60)
GLUCOSE: 120 mg/dL — AB (ref 65–99)
POTASSIUM: 4.3 mmol/L (ref 3.5–5.1)
Sodium: 137 mmol/L (ref 135–145)

## 2014-08-06 LAB — MAGNESIUM: MAGNESIUM: 1.7 mg/dL (ref 1.7–2.4)

## 2014-08-06 LAB — PHOSPHORUS: PHOSPHORUS: 3.5 mg/dL (ref 2.5–4.6)

## 2014-08-06 MED ORDER — PANTOPRAZOLE SODIUM 40 MG PO TBEC
40.0000 mg | DELAYED_RELEASE_TABLET | Freq: Every day | ORAL | Status: DC
  Administered 2014-08-06: 40 mg via ORAL
  Filled 2014-08-06: qty 1

## 2014-08-06 MED ORDER — CLONIDINE HCL 0.1 MG/24HR TD PTWK: 0.1000 mg | MEDICATED_PATCH | TRANSDERMAL | Status: DC

## 2014-08-06 MED ORDER — CHLORDIAZEPOXIDE HCL 25 MG PO CAPS
25.0000 mg | ORAL_CAPSULE | Freq: Four times a day (QID) | ORAL | Status: DC
  Administered 2014-08-06 (×4): 25 mg via ORAL
  Filled 2014-08-06 (×4): qty 1

## 2014-08-06 NOTE — Progress Notes (Signed)
PULMONARY / CRITICAL CARE MEDICINE   Name: Jerry Dunn MRN: [id number] DOB: 03-11-1980    ADMISSION DATE:  07/31/2014 CONSULTATION DATE:  08/06/2014  REFERRING MD :  David Stall  CHIEF COMPLAINT:  Agitation  INITIAL PRESENTATION:  34 y.o. M with ETOH abuse, admitted 07/31/14 with ETOH withdrawal and ETOH pancreatitis.  Later that afternoon, he had increasing agitation despite ativan, so PCCM called for consideration of transfer to ICU and initiation of precedex.  STUDIES:  CXR 7/6 >>> atx. RUQ [place] 7/6 >>> normal gallbladder, hepatic steatosis  SIGNIFICANT EVENTS: 7/6 - admitted with ETOH withdrawal and ETOH pancreatitis.  PCCM called later that afternoon for precedex 7/7- attempting to wean precedex 7/8 - agitated. More precedex needed as well as haldol.  7/9- added zyprexa, and clonidine 7/10 still agitated. Got  ativan over night. Abd softer. Added Librium taper. Precedex ceiling changed to 2 mcg. Asked nursing to ambulate. NGT removed. Ice and clears started.   INTERVAL EVENTS:  Improved, now weaned off precedex Tolerating clears Denies pain, some nausea.  Tells me that he wants to quit drinking, was going to classes prior to this admission  VITAL SIGNS: Temp:  [98.2 F (36.8 C)-99.2 F (37.3 C)] 98.9 F (37.2 C) (07/12 0745) Pulse Rate:  [64-105] 86 (07/12 0800) Resp:  [15-40] 22 (07/12 0800) BP: (111-167)/(69-101) 127/88 mmHg (07/12 0800) SpO2:  [92 %-100 %] 97 % (07/12 0800) HEMODYNAMICS:   VENTILATOR SETTINGS:   INTAKE / OUTPUT: Intake/Output      07/11 0701 - 07/12 0700 07/12 0701 - 07/13 0700   P.O. 1560    I.V. (mL/kg) 1052.8 (10.9) 30 (0.3)   NG/GT     IV Piggyback 400    Total Intake(mL/kg) 3012.8 (31.1) 30 (0.3)   Urine (mL/kg/hr) 2310 (1) 45 (0.2)   Stool 0 (0) 0 (0)   Total Output 2310 45   Net +702.8 -15        Stool Occurrence 1 x 1 x     PHYSICAL EXAMINATION: General: awake and up to chair, eating HEENT: No icterus or injection.  Moist mucus membranes PULM: clear B, no crackles or wheezes  CV: Regular rhythm. No edema. No appreciable JVD. Integument:  Warm & dry. No rash or bruising on exposed skin. Neuro: A&O x 3, non-focal LABS:  CBC  Recent Labs Lab 08/03/14 0207 08/04/14 0239 08/06/14 0219  WBC 9.7 10.1 8.1  HGB 11.8* 12.0* 12.0*  HCT 34.8* 35.3* 34.5*  PLT 171 202 283   Coag's No results for input(s): APTT, INR in the last 168 hours. BMET  Recent Labs Lab 08/04/14 0239 08/05/14 0315 08/06/14 0219  NA 133* 138 137  K 3.2* 3.2* 4.3  CL 104 104 101  CO2 18* 21* 25  BUN <5* <5* <5*  CREATININE 0.52* 0.51* 0.73  GLUCOSE 107* 111* 120*   Electrolytes  Recent Labs Lab 08/04/14 0239 08/05/14 0315 08/06/14 0219  CALCIUM 8.6* 8.9 9.1  MG  --   --  1.7  PHOS  --   --  3.5   Sepsis Markers  Recent Labs Lab 07/31/14 2052 07/31/14 2328 08/03/14 1250 08/04/14 0239 08/05/14 0315  LATICACIDVEN 1.5 1.3  --   --   --   PROCALCITON  --   --  0.56 1.27 0.32   ABG No results for input(s): PHART, PCO2ART, PO2ART in the last 168 hours. Liver Enzymes  Recent Labs Lab 08/02/14 0220 08/03/14 0207 08/04/14 0239  AST 97* 84* 70*  ALT 126*  108* 95*  ALKPHOS 69 92 111  BILITOT 1.3* 1.5* 1.2  ALBUMIN 2.7* 2.5* 2.6*   Cardiac Enzymes No results for input(s): TROPONINI, PROBNP in the last 168 hours. Glucose No results for input(s): GLUCAP in the last 168 hours.  Imaging No results found.   ASSESSMENT / PLAN:  NEUROLOGIC A:   ETOH withdrawal & DT's  Oversedation 7/11 > resolved  P:   Librium decrease to 25 mg QID, then slowly taper  Continue medium dose clonidine, will likely be able to wean to off before d/c home RASS goal: 0   D/c Thiamine IV Continue home lamictal  Consider restart home seroquel as librium weaned Ambulate  Pt is interested in and will need coordination of EtOH cessation resources for discharge.   PULMONARY A: Possible aspiration pneumonia vs  pneumonitis Bilateral effusions/atx   P:   Mobilize  See ID section   CARDIOVASCULAR A:  Tachycardia, sinus in setting withdrawal > resolved Hypertension, resolved  P:  Hydralazine IV prn for SBP >165 or DBP >100 Clonidine patch, wean to off next few days. Decrease to 0.1mg  on 7/12  RENAL A:   Hyponatremia - mild & likely due to chronic EtOH use, resolved Mild NAG acidosis -->improved  Hypokalemia, resolved P:   Trend chem KVO IVF 7/12  GASTROINTESTINAL A:   ETOH pancreatitis > labs improving, vitals normal; lipase normalized as of 7/9 Abdominal distension> likely ileus in setting of pancreatitis Alcoholic hepatitis> improved Hematochezia - 1 episode on night prior to admit (none since admission) P:   Clear diet, advance 7/12 Recheck lipase if any abd pain once on a PO diet Change protonix to Po qd Likely needs outpt GI workup for ? Hemorrhoidal or diverticular bleeding. Certainly at risk for gastritis as well.   HEMATOLOGIC A:   Mild thrombocytopenia - likely from underlying ETOH liver disease / cirrhosis Anemia - No active bleeding & no hematochezia or melena overnight  P:  Monitor for bleeding Follow CBC intermittently   INFECTIOUS A:   Alcoholic pancreatitis Possible aspiration PNA Fever secondary to pancreatitis vs aspiration pneumonitis/pneumonia   P:   Abx: Unasyn, start date 7/6 >>> 7/12 PCT protocol to a/w abx stop date   Pan-Culture for any sign of fever  Day 7 of 7 abx on 7/12, stop today  ENDOCRINE A:   Hyperglycemia - Likely in setting of pancreatitis without h/o diabetes mellitus  P:   Monitor with daily BMP Start SSI if glucose consistently greater than 180  SUMMARY EtOH withdrawal, pancreatitis, probable aspiration PNA. Abx finish today. Better, now off precedex. Will move to floor bed, start weaning clonidine and librium. Advance diet. Will transfer to [facility] service as of 7/13.     Levy Pupaobert Shaquna Geigle, MD, PhD 08/06/2014, 8:53  AM [facility] [phone] or if no answer [phone]

## 2014-08-06 NOTE — Progress Notes (Signed)
Pt transferred to 5W38 with belongings. Report given to receiving RN and all questions answered. VSS during transfer.  Pt assisted to chair in new room. Family at bedside.

## 2014-08-06 NOTE — Progress Notes (Signed)
Jerry ElseWilliam Dunn [id number]  Transfer Data: 08/06/2014 6:03 PM  Attending Provider: Kalman ShanMurali Ramaswamy, MD  PCP:No primary care provider on file.  Code Status: Full  Jerry ElseWilliam Dunn is a 34 y.o. male patient transferred from 2S  -No acute distress noted.  -No complaints of shortness of breath.  -No complaints of chest pain.   IV Fluids: IV in place, occlusive dsg intact without redness, IV cath upper arm left, condition patent and no redness  none.  Allergies: Bee venom  Past Medical History:  has no past medical history on file.  Past Surgical History:  has no past surgical history on file.  Social History:  reports that he has been smoking Cigarettes.  He has been smoking about 1.00 pack per day. He does not have any smokeless tobacco history on file. He reports that he drinks alcohol. He reports that he does not use illicit drugs.    Patient/Family orientated to room. Information packet given to patient/family.  Patient/family able to verbalize understanding of risk associated with falls and verbalized understanding to call for assistance before getting out of bed. Call light within reach. Patient/family able to voice and demonstrate understanding of unit orientation instructions.  Will continue to evaluate and treat per MD orders.

## 2014-08-07 NOTE — Progress Notes (Signed)
PIV removed.  Pt denied any complaints.  Reviewed discharge paperwork with pt.  Pt taken to discharge location via wheelchair.

## 2014-08-07 NOTE — Discharge Instructions (Signed)
Acute Pancreatitis Acute pancreatitis is a disease in which the pancreas becomes suddenly irritated (inflamed). The pancreas is a large gland behind your stomach. The pancreas makes enzymes that help digest food. The pancreas also makes 2 hormones that help control your blood sugar. Acute pancreatitis happens when the enzymes attack and damage the pancreas. Most attacks last a couple of days and can cause serious problems. HOME CARE  Follow your doctor's diet instructions. You may need to avoid alcohol and limit fat in your diet.  Eat small meals often.  Drink enough fluids to keep your pee (urine) clear or pale yellow.  Only take medicines as told by your doctor.  Avoid drinking alcohol if it caused your disease.  Do not smoke.  Get plenty of rest.  Check your blood sugar at home as told by your doctor.  Keep all doctor visits as told. GET HELP IF:  You do not get better as quickly as expected.  You have new or worsening symptoms.  You have lasting pain, weakness, or feel sick to your stomach (nauseous).  You get better and then have another pain attack. GET HELP RIGHT AWAY IF:   You are unable to eat or keep fluids down.  Your pain becomes severe.  You have a fever or lasting symptoms for more than 2 to 3 days.  You have a fever and your symptoms suddenly get worse.  Your skin or the white part of your eyes turn yellow (jaundice).  You throw up (vomit).  You feel dizzy, or you pass out (faint).  Your blood sugar is high (over 300 mg/dL). MAKE SURE YOU:   Understand these instructions.  Will watch your condition.  Will get help right away if you are not doing well or get worse. Document Released: 06/30/2007 Document Revised: 05/28/2013 Document Reviewed: 04/22/2011 [facility] Patient Information 2015 [place], [place]. This information is not intended to replace advice given to you by your health care provider. Make sure you discuss any questions you have with your  health care provider.   Alcohol Withdrawal Alcohol withdrawal happens when you normally drink alcohol a lot and suddenly stop drinking. Alcohol withdrawal symptoms can be mild to very bad. Mild withdrawal symptoms can include feeling sick to your stomach (nauseous), headache, or feeling irritable. Bad withdrawal symptoms can include shakiness, being very nervous (anxious), and not thinking clearly.  HOME CARE  Join an alcohol support group.  Stay away from people or situations that make you want to drink.  Eat a healthy diet. Eat a lot of fresh fruits, vegetables, and lean meats. GET HELP RIGHT AWAY IF:   You become confused. You start to see and hear things that are not really there.  You feel your heart beating very fast.  You throw up (vomit) blood or cannot stop throwing up. This may be bright red or look like black coffee grounds.  You have blood in your poop (stool). This may be bright red, maroon colored, or black and tarry.  You are lightheaded or pass out (faint).  You develop a fever. MAKE SURE YOU:   Understand these instructions.  Will watch your condition.  Will get help right away if you are not doing well or get worse. Document Released: 06/30/2007 Document Revised: 04/05/2011 Document Reviewed: 06/30/2007 [facility] Patient Information 2015 [place], [place]. This information is not intended to replace advice given to you by your health care provider. Make sure you discuss any questions you have with your health care provider.

## 2014-08-07 NOTE — Discharge Summary (Signed)
Physician Discharge Summary  Patient ID: Jerry Dunn MRN: [id number] DOB/AGE: 1980/03/15 34 y.o.  Admit date: 07/31/2014 Discharge date: 08/07/2014  Problem List Active Problems:   Alcoholic pancreatitis   Rectal bleeding   Alcohol withdrawal   Hyponatremia   Encephalopathy acute  HPI: Jerry Dunn is a 34 y.o. male with history of Alcohol Abuse who presents to the ED with complaints of Epigastric ABD Pain since 10 AM yesterday, with nausea and vomiting. He Reports that he began to pass bright red blood per rectum at 10 pm tonight.. He denies any hematemesis. He drinks 1-1.5 pints of vodka each night, and reports that he last drank at 10 pm. He was evaluated in the ED and was found to have a lipase level of 1542, and an initial hemoglobin of 14.3, and an FOBT was performed and was HEME positive. He was referred for medical admission and started on an IV Protonix drip, and the CIWA protocol with IV Ativan.   Hospital Course:   STUDIES:  CXR 7/6 >>> atx. RUQ US 7/6 >>> normal gallbladder, hepatic steatosis  SIGNIFICANT EVENTS: 7/6 - admitted with ETOH withdrawal and ETOH pancreatitis. PCCM called later that afternoon for precedex 7/7- attempting to wean precedex 7/8 - agitated. More precedex needed as well as haldol.  7/9- added zyprexa, and clonidine 7/10 still agitated. Got 8mg  ativan over night. Abd softer. Added Librium taper. Precedex ceiling changed to 2 mcg. Asked nursing to ambulate. NGT removed. Ice and clears started.  7/13 ready for discharge.  INTERVAL EVENTS:  Improved, now weaned off precedex Tolerating clears Denies pain, some nausea.  Tells me that he wants to quit drinking, was going to classes prior to this admission   ASSESSMENT / PLAN:  NEUROLOGIC A:  ETOH withdrawal & DT's  Oversedation 7/11 > resolved  P:  Librium decrease to 25 mg QID, now off. RASS goal: 0  D/c Thiamine IV Continue home lamictal  Consider  restart home seroquel as librium weaned Ambulate  Pt already in etoh program..   PULMONARY A: Possible aspiration pneumonia vs pneumonitis Bilateral effusions/atx   P:  Mobilize  See ID section   CARDIOVASCULAR A:  Tachycardia, sinus in setting withdrawal > resolved Hypertension, resolved  P:  Hydralazine IV prn for SBP >165 or DBP >100 Clonidine patch, off.  RENAL  Recent Labs Lab 08/04/14 0239 08/05/14 0315 08/06/14 0219  NA 133* 138 137    A:  Hyponatremia - mild & likely due to chronic EtOH use, resolved Mild NAG acidosis -->improved  Hypokalemia, resolved P:  Trend chem KVO IVF 7/12  GASTROINTESTINAL A:  ETOH pancreatitis > labs improving, vitals normal; lipase normalized as of 7/9 Abdominal distension> likely ileus in setting of pancreatitis Alcoholic hepatitis> improved Hematochezia - 1 episode on night prior to admit (none since admission) P:  Clear diet, advance 7/12 Recheck lipase if any abd pain once on a PO diet, tolerated diet ok Change protonix to Po qd Likely needs outpt GI workup for ? Hemorrhoidal or diverticular bleeding. Certainly at risk for gastritis as well.  He is to seek as out patient.   HEMATOLOGIC A:  Mild thrombocytopenia - likely from underlying ETOH liver disease / cirrhosis Anemia - No active bleeding & no hematochezia or melena overnight  P:  Monitor for bleeding Follow CBC intermittently   INFECTIOUS A:  Alcoholic pancreatitis Possible aspiration PNA Fever secondary to pancreatitis vs aspiration pneumonitis/pneumonia   P:  Abx: Unasyn, start date 7/6 >>> 7/12 PCT protocol to  a/w abx stop date  Pan-Culture for any sign of fever  Day 7 of 7 abx on 7/12, stop today  ENDOCRINE  A:  Hyperglycemia - Likely in setting of pancreatitis without h/o diabetes mellitus  P:  Monitor with daily BMP Start SSI if glucose consistently greater than 180  SUMMARY EtOH withdrawal, pancreatitis,  probable aspiration PNA. Abx finish today. Better, now off precedex. Will move to floor bed, start weaning clonidine and librium. Advance diet. Will dc home on 08/07/14.  Labs at discharge Lab Results  Component Value Date   CREATININE 0.73 08/06/2014   BUN <5* 08/06/2014   NA 137 08/06/2014   K 4.3 08/06/2014   CL 101 08/06/2014   CO2 25 08/06/2014   Lab Results  Component Value Date   WBC 8.1 08/06/2014   HGB 12.0* 08/06/2014   HCT 34.5* 08/06/2014   MCV 94.0 08/06/2014   PLT 283 08/06/2014   Lab Results  Component Value Date   ALT 95* 08/04/2014   AST 70* 08/04/2014   ALKPHOS 111 08/04/2014   BILITOT 1.2 08/04/2014   No results found for: INR, PROTIME  Current radiology studies No results found.  Disposition:  Final discharge disposition not confirmed  Discharge Instructions    Discharge patient    Complete by:  As directed             Medication List    STOP taking these medications        ibuprofen 200 MG tablet  Commonly known as:  ADVIL,MOTRIN     penicillin v potassium 500 MG tablet  Commonly known as:  VEETID     PROBIOTIC DAILY PO      TAKE these medications        alum & mag hydroxide-simeth 400-400-40 MG/5ML suspension  Commonly known as:  MAALOX PLUS  Take 5 mLs by mouth every 6 (six) hours as needed for indigestion.     bismuth subsalicylate 262 MG/15ML suspension  Commonly known as:  PEPTO BISMOL  Take 30 mLs by mouth every 6 (six) hours as needed for indigestion or diarrhea or loose stools.     lamoTRIgine 25 MG tablet  Commonly known as:  LAMICTAL  Take 25 mg by mouth daily.     QUEtiapine 50 MG tablet  Commonly known as:  SEROQUEL  Take 50 mg by mouth at bedtime.           Follow-up Information    Follow up with PARRETT,TAMMY, NP.   Specialty:  Nurse Practitioner   Why:  2:15 pm   Contact information:   [address] [place] [address] [phone]        Discharged Condition: good  Time spent on  discharge greater than 40 minutes.  Vital signs at Discharge. Temp:  [98.1 F (36.7 C)-98.6 F (37 C)] 98.3 F (36.8 C) (07/13 0654) Pulse Rate:  [82-94] 82 (07/13 0654) Resp:  [15-22] 16 (07/13 0654) BP: (109-135)/(75-93) 135/83 mmHg (07/13 0654) SpO2:  [93 %-96 %] 93 % (07/13 0654) Office follow up Special Information or instructions. He will follow up with Tammy Parret ANP_BC as instructed. Stop drinking if possible. Signed: Brett Canales Minor ACNP [facility] PCCM Pager [phone] till 3 pm If no answer page [phone] 08/07/2014, 10:21 AM  Levy Pupa, MD, PhD 08/07/2014, 4:12 PM [facility] [phone] or if no answer [phone]

## 2014-08-07 NOTE — Progress Notes (Signed)
Pt ambulated in hallway with nurse tech. Gait stable.  Ambulated about 4250ft.  Pt tolerated well.

## 2014-08-07 NOTE — Care Management Note (Signed)
Case Management Note  Patient Details  Name: Jerry ElseWilliam Dunn MRN: [id number] Date of Birth: 06-Jan-1981  Subjective/Objective:       Patient is from home, he is for dc today, NCM scheduled a hospital follow up for patient on 7/20 at 2 pm.  NCM gave patient the brochure.  Patient states this is a good day for him to come to the follow up appt.              Action/Plan:   Expected Discharge Date:  08/07/14               Expected Discharge Plan:  Home/Self Care  In-House Referral:  Clinical Social Work  Discharge planning Services  [facility], Follow-up appt scheduled  Post Acute Care Choice:    Choice offered to:     DME Arranged:    DME Agency:     HH Arranged:    HH Agency:     Status of Service:  Completed, signed off  Medicare Important Message Given:    Date Medicare IM Given:    Medicare IM give by:    Date Additional Medicare IM Given:    Additional Medicare Important Message give by:     If discussed at Long Length of Stay Meetings, dates discussed:    Additional Comments:  Leone Havenaylor, Dionicio Shelnutt [place], RN 08/07/2014, 11:22 AM

## 2014-08-14 ENCOUNTER — Encounter: Payer: Self-pay | Admitting: Internal Medicine

## 2014-08-14 ENCOUNTER — Ambulatory Visit: Payer: Self-pay | Attending: Internal Medicine | Admitting: Internal Medicine

## 2014-08-14 VITALS — BP 125/95 | HR 98 | Temp 98.5°F | Resp 16 | Wt 155.0 lb

## 2014-08-14 DIAGNOSIS — Z720 Tobacco use: Secondary | ICD-10-CM | POA: Insufficient documentation

## 2014-08-14 DIAGNOSIS — F1010 Alcohol abuse, uncomplicated: Secondary | ICD-10-CM | POA: Insufficient documentation

## 2014-08-14 DIAGNOSIS — Z833 Family history of diabetes mellitus: Secondary | ICD-10-CM | POA: Insufficient documentation

## 2014-08-14 DIAGNOSIS — R7989 Other specified abnormal findings of blood chemistry: Secondary | ICD-10-CM | POA: Insufficient documentation

## 2014-08-14 DIAGNOSIS — Z139 Encounter for screening, unspecified: Secondary | ICD-10-CM | POA: Insufficient documentation

## 2014-08-14 DIAGNOSIS — Z8719 Personal history of other diseases of the digestive system: Secondary | ICD-10-CM | POA: Insufficient documentation

## 2014-08-14 DIAGNOSIS — K219 Gastro-esophageal reflux disease without esophagitis: Secondary | ICD-10-CM | POA: Insufficient documentation

## 2014-08-14 DIAGNOSIS — F17200 Nicotine dependence, unspecified, uncomplicated: Secondary | ICD-10-CM

## 2014-08-14 DIAGNOSIS — R945 Abnormal results of liver function studies: Secondary | ICD-10-CM

## 2014-08-14 LAB — CBC WITH DIFFERENTIAL/PLATELET
BASOS PCT: 2 % — AB (ref 0–1)
Basophils Absolute: 0.2 10*3/uL — ABNORMAL HIGH (ref 0.0–0.1)
Eosinophils Absolute: 0.1 10*3/uL (ref 0.0–0.7)
Eosinophils Relative: 1 % (ref 0–5)
HCT: 38.2 % — ABNORMAL LOW (ref 39.0–52.0)
HEMOGLOBIN: 13.1 g/dL (ref 13.0–17.0)
LYMPHS PCT: 41 % (ref 12–46)
Lymphs Abs: 4.6 10*3/uL — ABNORMAL HIGH (ref 0.7–4.0)
MCH: 32.5 pg (ref 26.0–34.0)
MCHC: 34.3 g/dL (ref 30.0–36.0)
MCV: 94.8 fL (ref 78.0–100.0)
MONO ABS: 0.7 10*3/uL (ref 0.1–1.0)
MONOS PCT: 6 % (ref 3–12)
MPV: 9.8 fL (ref 8.6–12.4)
Neutro Abs: 5.6 10*3/uL (ref 1.7–7.7)
Neutrophils Relative %: 50 % (ref 43–77)
Platelets: 576 10*3/uL — ABNORMAL HIGH (ref 150–400)
RBC: 4.03 MIL/uL — ABNORMAL LOW (ref 4.22–5.81)
RDW: 15.4 % (ref 11.5–15.5)
WBC: 11.1 10*3/uL — ABNORMAL HIGH (ref 4.0–10.5)

## 2014-08-14 NOTE — Progress Notes (Signed)
Patient Demographics  Jerry Dunn, is a 34 y.o. male  NWG:[id number]:[id number]  [id number]:[id number]  DOB - Feb 14, 1980  CC:  Chief Complaint  Patient presents with  . Follow-up       HPI: Jerry ElseWilliam Delvecchio is a 34 y.o. male here today to establish medical care. Patient was recently hospitalized with epigastric, abdominal pain, patient has history of alcohol abuse, EMR reviewed his lipase level was elevated, also had fobt positive, patient was started on IV Protonix and IV at 1 protocol, treated with IV fluids, during hospitalization he required Precedex drip, clonidine, Librium, his electrolytes replenished, his lipase level improved , also noted borderline anemia and thrombocytopenia likely secondary to alcohol abuse, patient was also treated with antibiotics for possible aspiration pneumonia. Currently patient denies any fever chills but still smokes cigarettes and drinks alcohol, have counseled patient to quit drinking. Patient has already been provided with the resources. Patient has No headache, No chest pain, No abdominal pain - No Nausea, No new weakness tingling or numbness, No Cough - SOB.  Allergies  Allergen Reactions  . Bee Venom Swelling   History reviewed. No pertinent past medical history. Current Outpatient Prescriptions on File Prior to Visit  Medication Sig Dispense Refill  . alum & mag hydroxide-simeth (MAALOX PLUS) 400-400-40 MG/5ML suspension Take 5 mLs by mouth every 6 (six) hours as needed for indigestion.    . bismuth subsalicylate (PEPTO BISMOL) 262 MG/15ML suspension Take 30 mLs by mouth every 6 (six) hours as needed for indigestion or diarrhea or loose stools.    . lamoTRIgine (LAMICTAL) 25 MG tablet Take 25 mg by mouth daily.    . QUEtiapine (SEROQUEL) 50 MG tablet Take 50 mg by mouth at bedtime.     No current facility-administered medications on file prior to visit.   Family History  Problem Relation Age of Onset  . Diabetes Mother   . Hypertension  Father    History   Social History  . Marital Status: Single    Spouse Name: N/A  . Number of Children: N/A  . Years of Education: N/A   Occupational History  . Not on file.   Social History Main Topics  . Smoking status: Current Every Day Smoker -- 1.00 packs/day for 16 years    Types: Cigarettes  . Smokeless tobacco: Not on file  . Alcohol Use: Yes     Comment: daily  . Drug Use: No  . Sexual Activity: Not on file   Other Topics Concern  . Not on file   Social History Narrative    Review of Systems: Constitutional: Negative for fever, chills, diaphoresis, activity change, appetite change and fatigue. HENT: Negative for ear pain, nosebleeds, congestion, facial swelling, rhinorrhea, neck pain, neck stiffness and ear discharge.  Eyes: Negative for pain, discharge, redness, itching and visual disturbance. Respiratory: Negative for cough, choking, chest tightness, shortness of breath, wheezing and stridor.  Cardiovascular: Negative for chest pain, palpitations and leg swelling. Gastrointestinal: Negative for abdominal distention. Genitourinary: Negative for dysuria, urgency, frequency, hematuria, flank pain, decreased urine volume, difficulty urinating and dyspareunia.  Musculoskeletal: Negative for back pain, joint swelling, arthralgia and gait problem. Neurological: Negative for dizziness, tremors, seizures, syncope, facial asymmetry, speech difficulty, weakness, light-headedness, numbness and headaches.  Hematological: Negative for adenopathy. Does not bruise/bleed easily. Psychiatric/Behavioral: Negative for hallucinations, behavioral problems, confusion, dysphoric mood, decreased concentration and agitation.    Objective:   Filed Vitals:   08/14/14 1408  BP: 125/95  Pulse: 98  Temp: 98.5  F (36.9 C)  Resp: 16    Physical Exam: Constitutional: Patient appears well-developed and well-nourished. No distress. HENT: Normocephalic, atraumatic, External right and  left ear normal. Oropharynx is clear and moist.  Eyes: Conjunctivae and EOM are normal. PERRLA, no scleral icterus. Neck: Normal ROM. Neck supple. No JVD. No tracheal deviation. No thyromegaly. CVS: RRR, S1/S2 +, no murmurs, no gallops, no carotid bruit.  Pulmonary: Effort and breath sounds normal, no stridor, rhonchi, wheezes, rales.  Abdominal: Soft. BS +, no distension, tenderness, rebound or guarding.  Musculoskeletal: Normal range of motion. No edema and no tenderness.  Neuro: Alert. Normal reflexes, muscle tone coordination. No cranial nerve deficit. Skin: Skin is warm and dry. No rash noted. Not diaphoretic. No erythema. No pallor. Psychiatric: Normal mood and affect. Behavior, judgment, thought content normal.  Lab Results  Component Value Date   WBC 8.1 08/06/2014   HGB 12.0* 08/06/2014   HCT 34.5* 08/06/2014   MCV 94.0 08/06/2014   PLT 283 08/06/2014   Lab Results  Component Value Date   CREATININE 0.73 08/06/2014   BUN <5* 08/06/2014   NA 137 08/06/2014   K 4.3 08/06/2014   CL 101 08/06/2014   CO2 25 08/06/2014    No results found for: HGBA1C Lipid Panel  No results found for: CHOL, TRIG, HDL, CHOLHDL, VLDL, LDLCALC     Assessment and plan:   1. History of pancreatitis  - COMPLETE METABOLIC PANEL WITH GFR - CBC with Differential/Platelet - Lipase  2. Alcohol abuse Patient has resources for AA. - COMPLETE METABOLIC PANEL WITH GFR  3. Gastroesophageal reflux disease, esophagitis presence not specified List of modification, continue with Prevacid  4. Family history of diabetes mellitus (DM)  - Hemoglobin A1c  5. Smoking Counseled patient to quit smoking, patient is not ready yet.  6. Screening  - CBC with Differential/Platelet - Vit D  25 hydroxy (rtn osteoporosis monitoring)  7. Abnormal LFTs Likely secondary to alcohol abuse, we'll recheck LFTs.    Return in about 3 months (around 11/14/2014), or if symptoms worsen or fail to  improve.    The patient was given clear instructions to go to ER or return to medical center if symptoms don't improve, worsen or new problems develop. The patient verbalized understanding. The patient was told to call to get lab results if they haven't heard anything in the next week.    This note has been created with Education officer, environmental. Any transcriptional errors are unintentional.   Doris Cheadle, MD

## 2014-08-14 NOTE — Patient Instructions (Signed)
Smoking Cessation Quitting smoking is important to your health and has many advantages. However, it is not always easy to quit since nicotine is a very addictive drug. Oftentimes, people try 3 times or more before being able to quit. This document explains the best ways for you to prepare to quit smoking. Quitting takes hard work and a lot of effort, but you can do it. ADVANTAGES OF QUITTING SMOKING  You will live longer, feel better, and live better.  Your body will feel the impact of quitting smoking almost immediately.  Within 20 minutes, blood pressure decreases. Your pulse returns to its normal level.  After 8 hours, carbon monoxide levels in the blood return to normal. Your oxygen level increases.  After 24 hours, the chance of having a heart attack starts to decrease. Your breath, hair, and body stop smelling like smoke.  After 48 hours, damaged nerve endings begin to recover. Your sense of taste and smell improve.  After 72 hours, the body is virtually free of nicotine. Your bronchial tubes relax and breathing becomes easier.  After 2 to 12 weeks, lungs can hold more air. Exercise becomes easier and circulation improves.  The risk of having a heart attack, stroke, cancer, or lung disease is greatly reduced.  After 1 year, the risk of coronary heart disease is cut in half.  After 5 years, the risk of stroke falls to the same as a nonsmoker.  After 10 years, the risk of lung cancer is cut in half and the risk of other cancers decreases significantly.  After 15 years, the risk of coronary heart disease drops, usually to the level of a nonsmoker.  If you are pregnant, quitting smoking will improve your chances of having a healthy baby.  The people you live with, especially any children, will be healthier.  You will have extra money to spend on things other than cigarettes. QUESTIONS TO THINK ABOUT BEFORE ATTEMPTING TO QUIT You may want to talk about your answers with your  health care provider.  Why do you want to quit?  If you tried to quit in the past, what helped and what did not?  What will be the most difficult situations for you after you quit? How will you plan to handle them?  Who can help you through the tough times? Your family? Friends? A health care provider?  What pleasures do you get from smoking? What ways can you still get pleasure if you quit? Here are some questions to ask your health care provider:  How can you help me to be successful at quitting?  What medicine do you think would be best for me and how should I take it?  What should I do if I need more help?  What is smoking withdrawal like? How can I get information on withdrawal? GET READY  Set a quit date.  Change your environment by getting rid of all cigarettes, ashtrays, matches, and lighters in your home, car, or work. Do not let people smoke in your home.  Review your past attempts to quit. Think about what worked and what did not. GET SUPPORT AND ENCOURAGEMENT You have a better chance of being successful if you have help. You can get support in many ways.  Tell your family, friends, and coworkers that you are going to quit and need their support. Ask them not to smoke around you.  Get individual, group, or telephone counseling and support. Programs are available at local hospitals and health centers. Call   your local health department for information about programs in your area.  Spiritual beliefs and practices may help some smokers quit.  Download a "quit meter" on your computer to keep track of quit statistics, such as how long you have gone without smoking, cigarettes not smoked, and money saved.  Get a self-help book about quitting smoking and staying off tobacco. LEARN NEW SKILLS AND BEHAVIORS  Distract yourself from urges to smoke. Talk to someone, go for a walk, or occupy your time with a task.  Change your normal routine. Take a different route to work.  Drink tea instead of coffee. Eat breakfast in a different place.  Reduce your stress. Take a hot bath, exercise, or read a book.  Plan something enjoyable to do every day. Reward yourself for not smoking.  Explore interactive web-based programs that specialize in helping you quit. GET MEDICINE AND USE IT CORRECTLY Medicines can help you stop smoking and decrease the urge to smoke. Combining medicine with the above behavioral methods and support can greatly increase your chances of successfully quitting smoking.  Nicotine replacement therapy helps deliver nicotine to your body without the negative effects and risks of smoking. Nicotine replacement therapy includes nicotine gum, lozenges, inhalers, nasal sprays, and skin patches. Some may be available over-the-counter and others require a prescription.  Antidepressant medicine helps people abstain from smoking, but how this works is unknown. This medicine is available by prescription.  Nicotinic receptor partial agonist medicine simulates the effect of nicotine in your brain. This medicine is available by prescription. Ask your health care provider for advice about which medicines to use and how to use them based on your health history. Your health care provider will tell you what side effects to look out for if you choose to be on a medicine or therapy. Carefully read the information on the package. Do not use any other product containing nicotine while using a nicotine replacement product.  RELAPSE OR DIFFICULT SITUATIONS Most relapses occur within the first 3 months after quitting. Do not be discouraged if you start smoking again. Remember, most people try several times before finally quitting. You may have symptoms of withdrawal because your body is used to nicotine. You may crave cigarettes, be irritable, feel very hungry, cough often, get headaches, or have difficulty concentrating. The withdrawal symptoms are only temporary. They are strongest  when you first quit, but they will go away within 10-14 days. To reduce the chances of relapse, try to:  Avoid drinking alcohol. Drinking lowers your chances of successfully quitting.  Reduce the amount of caffeine you consume. Once you quit smoking, the amount of caffeine in your body increases and can give you symptoms, such as a rapid heartbeat, sweating, and anxiety.  Avoid smokers because they can make you want to smoke.  Do not let weight gain distract you. Many smokers will gain weight when they quit, usually less than 10 pounds. Eat a healthy diet and stay active. You can always lose the weight gained after you quit.  Find ways to improve your mood other than smoking. FOR MORE INFORMATION  www.smokefree.gov  Document Released: 01/05/2001 Document Revised: 05/28/2013 Document Reviewed: 04/22/2011 Exit[employer] Patient Information 2015 [employer]. This information is not intended to replace advice given to you by your health care provider. Make sure you discuss any questions you have with your health care provider.  

## 2014-08-14 NOTE — Progress Notes (Signed)
Patient here for follow up from the hospital Patient was admitted with pancreatitis from binge drinking Patient is still drinking but not as much

## 2014-08-15 ENCOUNTER — Telehealth: Payer: Self-pay

## 2014-08-15 LAB — HEMOGLOBIN A1C
Hgb A1c MFr Bld: 5.9 % — ABNORMAL HIGH (ref <5.7)
MEAN PLASMA GLUCOSE: 123 mg/dL — AB (ref <117)

## 2014-08-15 LAB — COMPLETE METABOLIC PANEL WITH GFR
ALT: 83 U/L — AB (ref 0–53)
AST: 121 U/L — ABNORMAL HIGH (ref 0–37)
Albumin: 4.6 g/dL (ref 3.5–5.2)
Alkaline Phosphatase: 94 U/L (ref 39–117)
BILIRUBIN TOTAL: 0.5 mg/dL (ref 0.2–1.2)
BUN: 8 mg/dL (ref 6–23)
CO2: 24 meq/L (ref 19–32)
Calcium: 10.6 mg/dL — ABNORMAL HIGH (ref 8.4–10.5)
Chloride: 98 mEq/L (ref 96–112)
Creat: 0.95 mg/dL (ref 0.50–1.35)
GLUCOSE: 98 mg/dL (ref 70–99)
Potassium: 6.5 mEq/L (ref 3.5–5.3)
SODIUM: 144 meq/L (ref 135–145)
TOTAL PROTEIN: 8.6 g/dL — AB (ref 6.0–8.3)

## 2014-08-15 LAB — VITAMIN D 25 HYDROXY (VIT D DEFICIENCY, FRACTURES): Vit D, 25-Hydroxy: 23 ng/mL — ABNORMAL LOW (ref 30–100)

## 2014-08-15 LAB — LIPASE: Lipase: 381 U/L — ABNORMAL HIGH (ref 0–75)

## 2014-08-15 NOTE — Telephone Encounter (Signed)
Spoke with patient and he is aware of his critical labs and  To go to the ED ASAP to address his potassium level

## 2014-09-02 ENCOUNTER — Inpatient Hospital Stay: Payer: Self-pay | Admitting: Adult Health

## 2015-04-01 ENCOUNTER — Emergency Department: Payer: Self-pay

## 2015-04-01 ENCOUNTER — Inpatient Hospital Stay
Admission: EM | Admit: 2015-04-01 | Discharge: 2015-04-08 | DRG: 896 | Disposition: A | Discharge status: Home / Self Care | Payer: Self-pay | Attending: Internal Medicine | Admitting: Internal Medicine

## 2015-04-01 DIAGNOSIS — S0121XA Laceration without foreign body of nose, initial encounter: Secondary | ICD-10-CM | POA: Diagnosis present

## 2015-04-01 DIAGNOSIS — E860 Dehydration: Secondary | ICD-10-CM | POA: Diagnosis present

## 2015-04-01 DIAGNOSIS — F1010 Alcohol abuse, uncomplicated: Secondary | ICD-10-CM | POA: Diagnosis present

## 2015-04-01 DIAGNOSIS — W06XXXA Fall from bed, initial encounter: Secondary | ICD-10-CM | POA: Diagnosis present

## 2015-04-01 DIAGNOSIS — F10239 Alcohol dependence with withdrawal, unspecified: Secondary | ICD-10-CM | POA: Diagnosis present

## 2015-04-01 DIAGNOSIS — Y92009 Unspecified place in unspecified non-institutional (private) residence as the place of occurrence of the external cause: Secondary | ICD-10-CM

## 2015-04-01 DIAGNOSIS — F10931 Alcohol use, unspecified with withdrawal delirium: Secondary | ICD-10-CM

## 2015-04-01 DIAGNOSIS — F10930 Alcohol use, unspecified with withdrawal, uncomplicated: Secondary | ICD-10-CM

## 2015-04-01 DIAGNOSIS — K852 Alcohol induced acute pancreatitis without necrosis or infection: Secondary | ICD-10-CM | POA: Diagnosis present

## 2015-04-01 DIAGNOSIS — R569 Unspecified convulsions: Secondary | ICD-10-CM | POA: Diagnosis present

## 2015-04-01 DIAGNOSIS — S0093XA Contusion of unspecified part of head, initial encounter: Secondary | ICD-10-CM | POA: Diagnosis present

## 2015-04-01 DIAGNOSIS — E876 Hypokalemia: Secondary | ICD-10-CM | POA: Diagnosis not present

## 2015-04-01 DIAGNOSIS — F10230 Alcohol dependence with withdrawal, uncomplicated: Secondary | ICD-10-CM

## 2015-04-01 DIAGNOSIS — F10939 Alcohol use, unspecified with withdrawal, unspecified: Secondary | ICD-10-CM | POA: Diagnosis present

## 2015-04-01 DIAGNOSIS — K860 Alcohol-induced chronic pancreatitis: Secondary | ICD-10-CM | POA: Diagnosis present

## 2015-04-01 DIAGNOSIS — Z4659 Encounter for fitting and adjustment of other gastrointestinal appliance and device: Secondary | ICD-10-CM

## 2015-04-01 DIAGNOSIS — D6959 Other secondary thrombocytopenia: Secondary | ICD-10-CM | POA: Diagnosis present

## 2015-04-01 DIAGNOSIS — R000 Tachycardia, unspecified: Secondary | ICD-10-CM

## 2015-04-01 DIAGNOSIS — F17210 Nicotine dependence, cigarettes, uncomplicated: Secondary | ICD-10-CM | POA: Diagnosis present

## 2015-04-01 DIAGNOSIS — F10231 Alcohol dependence with withdrawal delirium: Principal | ICD-10-CM | POA: Diagnosis present

## 2015-04-01 DIAGNOSIS — K861 Other chronic pancreatitis: Secondary | ICD-10-CM

## 2015-04-01 HISTORY — DX: Acute pancreatitis without necrosis or infection, unspecified: K85.90

## 2015-04-01 HISTORY — DX: Alcohol abuse, uncomplicated: F10.10

## 2015-04-01 MED ORDER — LORAZEPAM 2 MG/ML IJ SOLN
2.0000 mg | Freq: Once | INTRAMUSCULAR | Status: AC
  Administered 2015-04-01: 2 mg via INTRAVENOUS

## 2015-04-01 MED ORDER — ONDANSETRON HCL 4 MG PO TABS
4.0000 mg | ORAL_TABLET | Freq: Three times a day (TID) | ORAL | Status: DC | PRN
  Administered 2015-04-02: 4 mg via ORAL
  Filled 2015-04-01: qty 1

## 2015-04-01 MED ORDER — LORAZEPAM 2 MG/ML IJ SOLN: 0.0000 mg | Freq: Two times a day (BID) | INTRAMUSCULAR | Status: DC

## 2015-04-01 MED ORDER — ALUM & MAG HYDROXIDE-SIMETH 200-200-20 MG/5ML PO SUSP: 30.0000 mL | ORAL | Status: DC | PRN

## 2015-04-01 MED ORDER — LORAZEPAM 2 MG/ML IJ SOLN
0.0000 mg | Freq: Four times a day (QID) | INTRAMUSCULAR | Status: AC
  Administered 2015-04-02: 1 mg via INTRAVENOUS
  Administered 2015-04-02 (×3): 2 mg via INTRAVENOUS
  Administered 2015-04-02: 1 mg via INTRAVENOUS
  Administered 2015-04-03 (×3): 2 mg via INTRAVENOUS
  Filled 2015-04-01: qty 1
  Filled 2015-04-01 (×2): qty 2
  Filled 2015-04-01: qty 1
  Filled 2015-04-01: qty 2
  Filled 2015-04-01: qty 1
  Filled 2015-04-01: qty 2

## 2015-04-01 MED ORDER — ACETAMINOPHEN 325 MG PO TABS: 650.0000 mg | ORAL_TABLET | ORAL | Status: DC | PRN

## 2015-04-01 MED ORDER — IBUPROFEN 600 MG PO TABS
600.0000 mg | ORAL_TABLET | Freq: Three times a day (TID) | ORAL | Status: DC | PRN
  Administered 2015-04-02 – 2015-04-07 (×3): 600 mg via ORAL
  Filled 2015-04-01: qty 2
  Filled 2015-04-01 (×2): qty 1

## 2015-04-01 MED ORDER — LORAZEPAM 2 MG/ML IJ SOLN
INTRAMUSCULAR | Status: AC
  Administered 2015-04-01: 2 mg via INTRAVENOUS
  Filled 2015-04-01: qty 1

## 2015-04-01 MED ORDER — THIAMINE HCL 100 MG/ML IJ SOLN
Freq: Once | INTRAVENOUS | Status: AC
  Administered 2015-04-02: via INTRAVENOUS
  Filled 2015-04-01: qty 1000

## 2015-04-01 NOTE — ED Notes (Signed)
Patient transported to CT 

## 2015-04-01 NOTE — ED Notes (Signed)
Pt in with seizure x 2 has abrasion to nose, patient states he is an alcoholic and has been trying to stop on his own. No alcohol for 2 days pt is shaky at this time.

## 2015-04-01 NOTE — ED Notes (Signed)
Pt reports he drinks 10liters of alcohol a day and stopped 2 days ago trying to detox. Mother reports multiple seizures today. Pt with multiple abrasions to face and tongue. Hx of pancreatitis.

## 2015-04-02 ENCOUNTER — Encounter: Payer: Self-pay | Admitting: Internal Medicine

## 2015-04-02 DIAGNOSIS — R569 Unspecified convulsions: Secondary | ICD-10-CM

## 2015-04-02 LAB — COMPREHENSIVE METABOLIC PANEL
ALBUMIN: 4.2 g/dL (ref 3.5–5.0)
ALBUMIN: 5.1 g/dL — AB (ref 3.5–5.0)
ALK PHOS: 119 U/L (ref 38–126)
ALT: 142 U/L — ABNORMAL HIGH (ref 17–63)
ALT: 193 U/L — AB (ref 17–63)
ANION GAP: 14 (ref 5–15)
ANION GAP: 21 — AB (ref 5–15)
AST: 449 U/L — ABNORMAL HIGH (ref 15–41)
AST: 310 U/L — AB (ref 15–41)
Alkaline Phosphatase: 90 U/L (ref 38–126)
BILIRUBIN TOTAL: 1.8 mg/dL — AB (ref 0.3–1.2)
BILIRUBIN TOTAL: 1.8 mg/dL — AB (ref 0.3–1.2)
BUN: 8 mg/dL (ref 6–20)
BUN: 6 mg/dL (ref 6–20)
CALCIUM: 10.5 mg/dL — AB (ref 8.9–10.3)
CHLORIDE: 99 mmol/L — AB (ref 101–111)
CO2: 23 mmol/L (ref 22–32)
CO2: 23 mmol/L (ref 22–32)
CREATININE: 0.78 mg/dL (ref 0.61–1.24)
Calcium: 8.8 mg/dL — ABNORMAL LOW (ref 8.9–10.3)
Chloride: 94 mmol/L — ABNORMAL LOW (ref 101–111)
Creatinine, Ser: 0.64 mg/dL (ref 0.61–1.24)
GFR calc Af Amer: >60 mL/min (ref >60)
GFR calc non Af Amer: >60 mL/min (ref >60)
GLUCOSE: 102 mg/dL — AB (ref 65–99)
GLUCOSE: 138 mg/dL — AB (ref 65–99)
POTASSIUM: 3.5 mmol/L (ref 3.5–5.1)
Potassium: 3.8 mmol/L (ref 3.5–5.1)
Sodium: 138 mmol/L (ref 135–145)
Sodium: 136 mmol/L (ref 135–145)
TOTAL PROTEIN: 9.2 g/dL — AB (ref 6.5–8.1)
Total Protein: 7.7 g/dL (ref 6.5–8.1)

## 2015-04-02 LAB — URINE DRUG SCREEN, QUALITATIVE (ARMC ONLY)
Amphetamines, Ur Screen: NOT DETECTED
BARBITURATES, UR SCREEN: NOT DETECTED
BENZODIAZEPINE, UR SCRN: NOT DETECTED
CANNABINOID 50 NG, UR ~~LOC~~: NOT DETECTED
COCAINE METABOLITE, UR ~~LOC~~: NOT DETECTED
MDMA (Ecstasy)Ur Screen: NOT DETECTED
METHADONE SCREEN, URINE: NOT DETECTED
OPIATE, UR SCREEN: NOT DETECTED
PHENCYCLIDINE (PCP) UR S: NOT DETECTED
Tricyclic, Ur Screen: NOT DETECTED

## 2015-04-02 LAB — MAGNESIUM: MAGNESIUM: 1.6 mg/dL — AB (ref 1.7–2.4)

## 2015-04-02 LAB — CBC
HEMATOCRIT: 34.2 % — AB (ref 40.0–52.0)
HEMATOCRIT: 41.9 % (ref 40.0–52.0)
HEMOGLOBIN: 11.8 g/dL — AB (ref 13.0–18.0)
HEMOGLOBIN: 13.9 g/dL (ref 13.0–18.0)
MCH: 34.6 pg — AB (ref 26.0–34.0)
MCH: 34.7 pg — AB (ref 26.0–34.0)
MCHC: 33.2 g/dL (ref 32.0–36.0)
MCHC: 34.5 g/dL (ref 32.0–36.0)
MCV: 100.7 fL — AB (ref 80.0–100.0)
MCV: 104.3 fL — ABNORMAL HIGH (ref 80.0–100.0)
Platelets: 64 10*3/uL — ABNORMAL LOW (ref 150–440)
Platelets: 88 10*3/uL — ABNORMAL LOW (ref 150–440)
RBC: 3.39 MIL/uL — AB (ref 4.40–5.90)
RBC: 4.02 MIL/uL — AB (ref 4.40–5.90)
RDW: 13.8 % (ref 11.5–14.5)
RDW: 14.2 % (ref 11.5–14.5)
WBC: 6.2 10*3/uL (ref 3.8–10.6)
WBC: 7.9 10*3/uL (ref 3.8–10.6)

## 2015-04-02 LAB — LIPASE, BLOOD: Lipase: 178 U/L — ABNORMAL HIGH (ref 11–51)

## 2015-04-02 LAB — ACETAMINOPHEN LEVEL

## 2015-04-02 LAB — SALICYLATE LEVEL: Salicylate Lvl: <4 mg/dL (ref 2.8–30.0)

## 2015-04-02 LAB — ETHANOL: Alcohol, Ethyl (B): <5 mg/dL (ref <5)

## 2015-04-02 MED ORDER — CLONIDINE HCL 0.1 MG PO TABS
0.1000 mg | ORAL_TABLET | Freq: Two times a day (BID) | ORAL | Status: DC
  Administered 2015-04-02 – 2015-04-08 (×10): 0.1 mg via ORAL
  Filled 2015-04-02 (×11): qty 1

## 2015-04-02 MED ORDER — SODIUM CHLORIDE 0.9 % IV SOLN
INTRAVENOUS | Status: DC
  Administered 2015-04-02 – 2015-04-08 (×9): via INTRAVENOUS

## 2015-04-02 MED ORDER — VITAMIN B-1 100 MG PO TABS
100.0000 mg | ORAL_TABLET | Freq: Every day | ORAL | Status: DC
  Administered 2015-04-02 – 2015-04-08 (×6): 100 mg via ORAL
  Filled 2015-04-02 (×6): qty 1

## 2015-04-02 MED ORDER — SODIUM CHLORIDE 0.9% FLUSH
3.0000 mL | Freq: Two times a day (BID) | INTRAVENOUS | Status: DC
  Administered 2015-04-02 – 2015-04-07 (×6): 3 mL via INTRAVENOUS

## 2015-04-02 MED ORDER — VITAMIN D 1000 UNITS PO TABS
1000.0000 [IU] | ORAL_TABLET | Freq: Every day | ORAL | Status: DC
  Administered 2015-04-02 – 2015-04-08 (×6): 1000 [IU] via ORAL
  Filled 2015-04-02 (×7): qty 1

## 2015-04-02 MED ORDER — PANTOPRAZOLE SODIUM 40 MG PO TBEC
40.0000 mg | DELAYED_RELEASE_TABLET | Freq: Every day | ORAL | Status: DC
Start: 2015-04-02 — End: 2015-04-08
  Administered 2015-04-02 – 2015-04-08 (×6): 40 mg via ORAL
  Filled 2015-04-02 (×6): qty 1

## 2015-04-02 MED ORDER — ENOXAPARIN SODIUM 40 MG/0.4ML ~~LOC~~ SOLN: 40.0000 mg | SUBCUTANEOUS | Status: DC

## 2015-04-02 MED ORDER — FOLIC ACID 1 MG PO TABS
1.0000 mg | ORAL_TABLET | Freq: Every day | ORAL | Status: DC
  Administered 2015-04-02 – 2015-04-08 (×6): 1 mg via ORAL
  Filled 2015-04-02 (×6): qty 1

## 2015-04-02 MED ORDER — VITAMIN B-12 1000 MCG PO TABS
1000.0000 ug | ORAL_TABLET | Freq: Every day | ORAL | Status: DC
  Administered 2015-04-02 – 2015-04-08 (×6): 1000 ug via ORAL
  Filled 2015-04-02 (×6): qty 1

## 2015-04-02 MED ORDER — ONDANSETRON HCL 4 MG PO TABS: 4.0000 mg | ORAL_TABLET | Freq: Four times a day (QID) | ORAL | Status: DC | PRN

## 2015-04-02 MED ORDER — HALOPERIDOL LACTATE 5 MG/ML IJ SOLN
2.0000 mg | Freq: Four times a day (QID) | INTRAMUSCULAR | Status: DC | PRN
  Administered 2015-04-02 – 2015-04-04 (×4): 2 mg via INTRAVENOUS
  Filled 2015-04-02 (×4): qty 1

## 2015-04-02 MED ORDER — HALOPERIDOL LACTATE 5 MG/ML IJ SOLN
1.0000 mg | INTRAMUSCULAR | Status: AC
  Administered 2015-04-02: 1 mg via INTRAVENOUS
  Filled 2015-04-02: qty 1

## 2015-04-02 MED ORDER — ONDANSETRON HCL 4 MG/2ML IJ SOLN: 4.0000 mg | Freq: Four times a day (QID) | INTRAMUSCULAR | Status: DC | PRN

## 2015-04-02 MED ORDER — LORAZEPAM 2 MG/ML IJ SOLN
2.0000 mg | INTRAMUSCULAR | Status: DC | PRN
  Filled 2015-04-02: qty 1

## 2015-04-02 NOTE — Progress Notes (Signed)
Dr Imogene Burnhen called back, patient is very agitated. Haldol ordered.

## 2015-04-02 NOTE — Progress Notes (Signed)
Unable to find seizure pads. Will pass to day shift

## 2015-04-02 NOTE — Progress Notes (Signed)
[facility] [place] [facility]   PATIENT NAME: Jerry ElseWilliam Dunn    MR#:  [id number]  DATE OF BIRTH:  Nov 10, 1980  SUBJECTIVE:  CHIEF COMPLAINT:   Chief Complaint  Patient presents with  . Seizures    Have tachycardia today,but fully alert and oriented.  REVIEW OF SYSTEMS:  CONSTITUTIONAL: No fever, fatigue or weakness.  EYES: No blurred or double vision.  EARS, NOSE, AND THROAT: No tinnitus or ear pain.  RESPIRATORY: No cough, shortness of breath, wheezing or hemoptysis.  CARDIOVASCULAR: No chest pain, orthopnea, edema.  GASTROINTESTINAL: No nausea, vomiting, diarrhea or abdominal pain.  GENITOURINARY: No dysuria, hematuria.  ENDOCRINE: No polyuria, nocturia,  HEMATOLOGY: No anemia, easy bruising or bleeding SKIN: No rash or lesion. MUSCULOSKELETAL: No joint pain or arthritis.   NEUROLOGIC: No tingling, numbness, weakness.  PSYCHIATRY: No anxiety or depression.   ROS  DRUG ALLERGIES:   Allergies  Allergen Reactions  . Bee Venom Swelling    VITALS:  Blood pressure 129/88, pulse 100, temperature 98.8 F (37.1 C), temperature source Oral, resp. rate 18, height 5\' 7"  (1.702 m), weight 67.767 kg (149 lb 6.4 oz), SpO2 100 %.  PHYSICAL EXAMINATION:  GENERAL:  35 y.o.-year-old patient lying in the bed with no acute distress.  EYES: Pupils equal, round, reactive to light and accommodation. No scleral icterus. Extraocular muscles intact.  HEENT: Head has trauma marks on nose and forehead, normocephalic. Oropharynx and nasopharynx clear.  NECK:  Supple, no jugular venous distention. No thyroid enlargement, no tenderness.  LUNGS: Normal breath sounds bilaterally, no wheezing, rales,rhonchi or crepitation. No use of accessory muscles of respiration.  CARDIOVASCULAR: S1, S2 normal,tachycardia. No murmurs, rubs, or gallops.  ABDOMEN: Soft, nontender, nondistended. Bowel sounds present. No organomegaly or mass.  EXTREMITIES: No pedal edema, cyanosis,  or clubbing.  NEUROLOGIC: Cranial nerves II through XII are intact. Muscle strength 5/5 in all extremities. Sensation intact. Gait not checked. Some tremors present. PSYCHIATRIC: The patient is alert and oriented x 3.  SKIN: No obvious rash, lesion, or ulcer.   Physical Exam LABORATORY PANEL:   CBC  Recent Labs Lab 04/02/15 0630  WBC 6.2  HGB 11.8*  HCT 34.2*  PLT 64*   ------------------------------------------------------------------------------------------------------------------  Chemistries   Recent Labs Lab 04/02/15 0630  NA 136  K 3.5  CL 99*  CO2 23  GLUCOSE 102*  BUN 6  CREATININE 0.64  CALCIUM 8.8*  MG 1.6*  AST 310*  ALT 142*  ALKPHOS 90  BILITOT 1.8*   ------------------------------------------------------------------------------------------------------------------  Cardiac Enzymes No results for input(s): TROPONINI in the last 168 hours. ------------------------------------------------------------------------------------------------------------------  RADIOLOGY:  Ct Head Wo Contrast  04/02/2015  CLINICAL DATA:  Multiple seizures today. Struck head. Abrasions to the face and tongue. Patient recently stopped drinking trying to detox. EXAM: CT HEAD WITHOUT CONTRAST CT MAXILLOFACIAL WITHOUT CONTRAST TECHNIQUE: Multidetector CT imaging of the head and maxillofacial structures were performed using the standard protocol without intravenous contrast. Multiplanar CT image reconstructions of the maxillofacial structures were also generated. COMPARISON:  CT head 09/17/2006 FINDINGS: CT HEAD FINDINGS The ventricles and sulci appear symmetrical. No significant ventricular dilatation. No mass effect or midline shift. No abnormal extra-axial fluid collections. Gray-white matter junctions are distinct. Basal cisterns are not effaced. No evidence of acute intracranial hemorrhage. No depressed skull fractures. Visualized paranasal sinuses and mastoid air cells are not  opacified. CT MAXILLOFACIAL FINDINGS The globes and extraocular muscles appear intact and symmetrical. Mucosal thickening in the floor of the right maxillary antrum.  Paranasal sinuses are otherwise clear. No acute air-fluid levels. The frontal bones, orbital rims, nasal bones, maxillary antral walls, zygomatic arches, pterygoid plates, temporomandibular joints, and mandibles appear intact. No acute displaced fractures are identified. Previous tooth extractions. Circumscribed cystic lesion in the subcutaneous fat inferior to the left mandible measuring 2.3 cm maximal diameter. This likely represents a sebaceous cyst. IMPRESSION: No acute intracranial abnormalities. No acute displaced orbital or facial fractures identified. Electronically Signed   By: Burman Nieves M.D.   On: 04/02/2015 00:33   Ct Maxillofacial Wo Cm  04/02/2015  CLINICAL DATA:  Multiple seizures today. Struck head. Abrasions to the face and tongue. Patient recently stopped drinking trying to detox. EXAM: CT HEAD WITHOUT CONTRAST CT MAXILLOFACIAL WITHOUT CONTRAST TECHNIQUE: Multidetector CT imaging of the head and maxillofacial structures were performed using the standard protocol without intravenous contrast. Multiplanar CT image reconstructions of the maxillofacial structures were also generated. COMPARISON:  CT head 09/17/2006 FINDINGS: CT HEAD FINDINGS The ventricles and sulci appear symmetrical. No significant ventricular dilatation. No mass effect or midline shift. No abnormal extra-axial fluid collections. Gray-white matter junctions are distinct. Basal cisterns are not effaced. No evidence of acute intracranial hemorrhage. No depressed skull fractures. Visualized paranasal sinuses and mastoid air cells are not opacified. CT MAXILLOFACIAL FINDINGS The globes and extraocular muscles appear intact and symmetrical. Mucosal thickening in the floor of the right maxillary antrum. Paranasal sinuses are otherwise clear. No acute air-fluid levels.  The frontal bones, orbital rims, nasal bones, maxillary antral walls, zygomatic arches, pterygoid plates, temporomandibular joints, and mandibles appear intact. No acute displaced fractures are identified. Previous tooth extractions. Circumscribed cystic lesion in the subcutaneous fat inferior to the left mandible measuring 2.3 cm maximal diameter. This likely represents a sebaceous cyst. IMPRESSION: No acute intracranial abnormalities. No acute displaced orbital or facial fractures identified. Electronically Signed   By: Burman Nieves M.D.   On: 04/02/2015 00:33    ASSESSMENT AND PLAN:   Principal Problem:   Seizure (HCC)  *  Alcohol withdrawal seizure *  Alcohol withdrawal     Give IV fluids, Ativan as needed, no more seizures.     Thiamin and folic acid. *  acute on chronic pancreatitis     Not much pain now- asking to try food.     Give soft diet now. *  Dehydration- iv fluids. *  Abnormal liver function tests      Due to chronic alcoholism. *  Thrombocytopenia      Due to alcoholism, monitor.   All the records are reviewed and case discussed with Care Management/Social Workerr. Management plans discussed with the patient, family and they are in agreement.  CODE STATUS: full  TOTAL TIME TAKING CARE OF THIS PATIENT: 35 minutes.     POSSIBLE D/C IN 2 DAYS, DEPENDING ON CLINICAL CONDITION.   Altamese Dilling M.D on 04/02/2015   Between 7am to 6pm - Pager - [phone]  After 6pm go to [email or web address] - password [email or web address] [facility]  [place] [facility]  Office  [phone]  CC: Primary care physician; No primary care provider on file.  Note: This dictation was prepared with Dragon dictation along with smaller phrase technology. Any transcriptional errors that result from this process are unintentional.

## 2015-04-02 NOTE — Progress Notes (Signed)
Called supply chain to request seizure pads, seizure pads unavailable. Seizure pads unavailable on unit, charge nurse made aware.

## 2015-04-02 NOTE — ED Provider Notes (Signed)
[facility] Emergency Department Provider Note  ____________________________________________  Time seen: Approximately 2336 PM  I have reviewed the triage vital signs and the nursing notes.   HISTORY  Chief Complaint Seizures    HPI Jerry Dunn is a 35 y.o. male who comes into the hospital today with 2 seizures. The patient has never had seizures before. These 2 occurred since 6 PM. The patient reports that he is unsure how long the seizure lasted. He was laying in the bed and then suddenly noticed that there was blood in his face and on the floor. The patient's mother reports that she did not realize that he had the first seizure and the patient told her he simply had a nosebleed but she noticed during the second seizure that he was violently shaking and his head was hitting the floor. The patient stopped drinking recently. He has a history of drinking 10 beers daily but stopped drinking 2 days ago. The patient reports that he did detox last summer as well as last year but he thinks he needs to do detox again. He reports that he feels very jerky and weak. The patient has blood to his face and nose. He also has a contusion to his head. The patient's family was concerned and they also want him to do detox so they decided to bring him in for evaluation and treatment. The patient denies seizures with withdrawing in the past.According to the patient's mother before he started having the seizures he had been vomiting as well.   Past Medical History  Diagnosis Date  . Pancreatitis   . Alcohol abuse     Patient Active Problem List   Diagnosis Date Noted  . Seizure (HCC) 04/02/2015  . Alcoholic pancreatitis 07/31/2014  . Rectal bleeding 07/31/2014  . Alcohol withdrawal (HCC) 07/31/2014  . Hyponatremia 07/31/2014  . Encephalopathy acute 07/31/2014  . Alcohol abuse     Past Surgical History  Procedure Laterality Date  . None      No current outpatient  prescriptions on file.  Allergies Bee venom  Family History  Problem Relation Age of Onset  . Diabetes Mother   . Hypertension Father     Social History Social History  Substance Use Topics  . Smoking status: Current Every Day Smoker -- 1.00 packs/day for 16 years    Types: Cigarettes  . Smokeless tobacco: None  . Alcohol Use: 0.0 oz/week    0 Standard drinks or equivalent per week     Comment: 10 beers daily    Review of Systems Constitutional: Shakiness No fever/chills Eyes: No visual changes. ENT: No sore throat. Cardiovascular: Denies chest pain. Respiratory: Denies shortness of breath. Gastrointestinal: Vomiting Genitourinary: Negative for dysuria. Musculoskeletal: Negative for back pain. Skin: Abrasion and skin tear to nose,  Neurological: Seizure  10-point ROS otherwise negative.  ____________________________________________   PHYSICAL EXAM:  VITAL SIGNS: ED Triage Vitals  Enc Vitals Group     BP 04/01/15 2328 144/105 mmHg     Pulse Rate 04/01/15 2328 108     Resp 04/01/15 2328 18     Temp 04/01/15 2328 98.1 F (36.7 C)     Temp Source 04/01/15 2328 Oral     SpO2 04/01/15 2328 98 %     Weight 04/01/15 2328 150 lb (68.04 kg)     Height 04/01/15 2328  (1.676 m)     Head Cir --      Peak Flow --  Pain Score 04/01/15 2328 4     Pain Loc --      Pain Edu? --      Excl. in GC? --     Constitutional: Alert and oriented. Well appearing and in moderate distress. Eyes: Conjunctivae are normal. PERRL. EOMI. Head: Contusion to forehead. Nose: Abrasion to tip of nose with some skin tear to the bridge of the patient's nose Mouth/Throat: Bite mark to tongue. Mucous membranes are moist.  Oropharynx non-erythematous. Neck: No cervical spine tenderness to palpation Cardiovascular: Tachycardia, regular rhythm. Grossly normal heart sounds.  Good peripheral circulation. Respiratory: Normal respiratory effort.  No retractions. Lungs  CTAB. Gastrointestinal: Soft and nontender. No distention. Positive bowel sounds Musculoskeletal: No lower extremity tenderness nor edema.   Neurologic:  Normal speech and language. Patient very jerky and shaky Skin:  Skin is warm, dry and intact.  Psychiatric: Mood and affect are normal.   ____________________________________________   LABS (all labs ordered are listed, but only abnormal results are displayed)  Labs Reviewed  CBC - Abnormal; Notable for the following:    RBC 4.02 (*)    MCV 104.3 (*)    MCH 34.6 (*)    Platelets 88 (*)    All other components within normal limits  COMPREHENSIVE METABOLIC PANEL - Abnormal; Notable for the following:    Chloride 94 (*)    Glucose, Bld 138 (*)    Calcium 10.5 (*)    Total Protein 9.2 (*)    Albumin 5.1 (*)    AST 449 (*)    ALT 193 (*)    Total Bilirubin 1.8 (*)    Anion gap 21 (*)    All other components within normal limits  LIPASE, BLOOD - Abnormal; Notable for the following:    Lipase 178 (*)    All other components within normal limits  ACETAMINOPHEN LEVEL - Abnormal; Notable for the following:    Acetaminophen (Tylenol), Serum <10 (*)    All other components within normal limits  MAGNESIUM - Abnormal; Notable for the following:    Magnesium 1.6 (*)    All other components within normal limits  COMPREHENSIVE METABOLIC PANEL - Abnormal; Notable for the following:    Chloride 99 (*)    Glucose, Bld 102 (*)    Calcium 8.8 (*)    AST 310 (*)    ALT 142 (*)    Total Bilirubin 1.8 (*)    All other components within normal limits  CBC - Abnormal; Notable for the following:    RBC 3.39 (*)    Hemoglobin 11.8 (*)    HCT 34.2 (*)    MCV 100.7 (*)    MCH 34.7 (*)    Platelets 64 (*)    All other components within normal limits  URINE DRUG SCREEN, QUALITATIVE ([facility] ONLY)  ETHANOL  SALICYLATE LEVEL   ____________________________________________  EKG  ED ECG REPORT I, Rebecka ApleyWebster,  Allison P, the attending physician,  personally viewed and interpreted this ECG.   Date: 04/01/2015  EKG Time: 2335  Rate: 103  Rhythm: sinus tachycardia  Axis: normal  Intervals:none  ST&T Change: flipped t wave in lead V3  ____________________________________________  RADIOLOGY  CT head and maxillofacial: No acute intracranial abnormalities, no acute displaced orbital or facial fractures identified. ____________________________________________   PROCEDURES  Procedure(s) performed: None  Critical Care performed: No  ____________________________________________   INITIAL IMPRESSION / ASSESSMENT AND PLAN / ED COURSE  Pertinent labs & imaging results that were available during my care  of the patient were reviewed by me and considered in my medical decision making (see chart for details).  This is a 36 year old male who comes into the hospital today with 2 seizures. The patient recently stopped drinking alcohol and he is a daily drinker. I feel that the seizures are due to alcohol withdrawal. The patient is very shaky and his initial CIWA score was 24. The patient received 2 mg of Ativan when he arrived and he was placed on the CIWA protocol. I also ordered a banana bag with multivitamin, folate, thiamine and normal saline for the patient. I also ordered a second bag of normal saline for the patient. The patient remained tachycardic with some moderate blood pressure while he was in the emergency department. After some time being in the emergency department it was discovered that his lipase was elevated with some signs of pancreatitis and he remained tachycardic. The decision was made to admit the patient to the hospital for alcohol withdrawal as well as seizures. The patient also has some mild pancreatitis. We did attempt to clean up the patient's nose to see if there was something that needed to be sutured but he does have a skin tear as well as an abrasion that is unable to suture. The patient will be admitted to the  hospitalist service. ____________________________________________   FINAL CLINICAL IMPRESSION(S) / ED DIAGNOSES  Final diagnoses:  Alcohol withdrawal seizure, uncomplicated (HCC)  Alcohol-induced chronic pancreatitis (HCC)  Tachycardia      Rebecka Apley, MD 04/02/15 [id number]

## 2015-04-02 NOTE — Progress Notes (Signed)
Patient is restless and agitated, climbing out of bed. Prime doc paged. Waiting on callback.

## 2015-04-02 NOTE — H&P (Signed)
[facility] -  at [facility]   PATIENT NAME: Jerry Dunn    MR#:  [id number]  DATE OF BIRTH:  08-14-1980  DATE OF ADMISSION:  04/01/2015  PRIMARY CARE PHYSICIAN: No primary care provider on file.   REQUESTING/REFERRING PHYSICIAN:   CHIEF COMPLAINT:   Chief Complaint  Patient presents with  . Seizures    HISTORY OF PRESENT ILLNESS: Nareg Breighner  is a 35 y.o. male with a known history of pancreatitis, alcohol abuse presented to the emergency room after he had a seizure. Patient's last drink of alcohol was 2 days ago and is trying to detox himself. He had increased tremors and shaking today. Around the 6 PM, patient fell from his bed according to his mother and hit his front side of the face. Patient  had a seizure at 10 PM which was generalized tonic-clonic convulsion. Patient had 2 episodes of seizures. No loss of consciousness. The patient fell down from the bed he injured his tip of the nose and the bridge of the nose. No history of hematemesis, hemoptysis and rectal bleed. No fever or chills or cough. Patient drinks alcohol on a regular basis.  PAST MEDICAL HISTORY:   Past Medical History  Diagnosis Date  . Pancreatitis   . Alcohol abuse     PAST SURGICAL HISTORY: Past Surgical History  Procedure Laterality Date  . None      SOCIAL HISTORY:  Social History  Substance Use Topics  . Smoking status: Current Every Day Smoker -- 1.00 packs/day for 16 years    Types: Cigarettes  . Smokeless tobacco: Not on file  . Alcohol Use: 0.0 oz/week    0 Standard drinks or equivalent per week     Comment: 10 beers daily    FAMILY HISTORY:  Family History  Problem Relation Age of Onset  . Diabetes Mother   . Hypertension Father     DRUG ALLERGIES:  Allergies  Allergen Reactions  . Bee Venom Swelling    REVIEW OF SYSTEMS:   CONSTITUTIONAL: No fever, has weakness.  EYES: No blurred or double vision.  EARS, NOSE, AND THROAT: No  tinnitus or ear pain. Has abrasion over the nose. RESPIRATORY: No cough, shortness of breath, wheezing or hemoptysis.  CARDIOVASCULAR: No chest pain, orthopnea, edema.  GASTROINTESTINAL: No nausea, vomiting, diarrhea or abdominal pain.  GENITOURINARY: No dysuria, hematuria.  ENDOCRINE: No polyuria, nocturia,  HEMATOLOGY: No anemia, easy bruising or bleeding SKIN: No rash or lesion. MUSCULOSKELETAL: No joint pain or arthritis.  Tremor in out stretched hands. NEUROLOGIC: No tingling, numbness, weakness.  PSYCHIATRY: No anxiety or depression.   MEDICATIONS AT HOME:  Prior to Admission medications   Medication Sig Start Date End Date Taking? Authorizing Provider  cholecalciferol (VITAMIN D) 1000 units tablet Take 1,000 Units by mouth daily.   Yes Historical Provider, MD  omeprazole (PRILOSEC) 40 MG capsule Take 40 mg by mouth daily.   Yes Historical Provider, MD  vitamin B-12 (CYANOCOBALAMIN) 1000 MCG tablet Take 1,000 mcg by mouth daily.   Yes Historical Provider, MD      PHYSICAL EXAMINATION:   VITAL SIGNS: Blood pressure 146/111, pulse 120, temperature 98.1 F (36.7 C), temperature source Oral, resp. rate 18, height  (1.676 m), weight 68.04 kg (150 lb), SpO2 99 %.  GENERAL:  35 y.o.-year-old patient lying in the bed in mild distress.  EYES: Pupils equal, round, reactive to light and accommodation. No scleral icterus. Extraocular muscles intact.  HEENT: Abrasion over the  bridge and tip of the nose, normocephalic. Oropharynx dry and nasopharynx clear.  NECK:  Supple, no jugular venous distention. No thyroid enlargement, no tenderness.  LUNGS: Normal breath sounds bilaterally, no wheezing, rales,rhonchi or crepitation. No use of accessory muscles of respiration.  CARDIOVASCULAR: S1, S2 normal. No murmurs, rubs, or gallops.  ABDOMEN: Soft, nontender, nondistended. Bowel sounds present. No organomegaly or mass.  EXTREMITIES: No pedal edema, cyanosis, or clubbing.  NEUROLOGIC: Cranial  nerves II through XII are intact. Muscle strength 5/5 in all extremities. Sensation intact. Gait not checked. No cerebellar signs noted. PSYCHIATRIC: The patient is alert and oriented x 3.  SKIN: Abrasion over the bridge and tip of the nose.  LABORATORY PANEL:   CBC  Recent Labs Lab 04/01/15 2347  WBC 7.9  HGB 13.9  HCT 41.9  PLT 88*  MCV 104.3*  MCH 34.6*  MCHC 33.2  RDW 14.2   ------------------------------------------------------------------------------------------------------------------  Chemistries   Recent Labs Lab 04/01/15 2347  NA 138  K 3.8  CL 94*  CO2 23  GLUCOSE 138*  BUN 8  CREATININE 0.78  CALCIUM 10.5*  AST 449*  ALT 193*  ALKPHOS 119  BILITOT 1.8*   ------------------------------------------------------------------------------------------------------------------ estimated creatinine clearance is 116.3 mL/min (by C-G formula based on Cr of 0.78). ------------------------------------------------------------------------------------------------------------------ No results for input(s): TSH, T4TOTAL, T3FREE, THYROIDAB in the last 72 hours.  Invalid input(s): FREET3   Coagulation profile No results for input(s): INR, PROTIME in the last 168 hours. ------------------------------------------------------------------------------------------------------------------- No results for input(s): DDIMER in the last 72 hours. -------------------------------------------------------------------------------------------------------------------  Cardiac Enzymes No results for input(s): CKMB, TROPONINI, MYOGLOBIN in the last 168 hours.  Invalid input(s): CK ------------------------------------------------------------------------------------------------------------------ Invalid input(s): POCBNP  ---------------------------------------------------------------------------------------------------------------  Urinalysis    Component Value Date/Time    COLORURINE ORANGE* 07/31/2014 0355   APPEARANCEUR CLEAR 07/31/2014 0355   LABSPEC 1.035* 07/31/2014 0355   PHURINE 5.5 07/31/2014 0355   GLUCOSEU 100* 07/31/2014 0355   HGBUR MODERATE* 07/31/2014 0355   BILIRUBINUR SMALL* 07/31/2014 0355   KETONESUR >80* 07/31/2014 0355   PROTEINUR >300* 07/31/2014 0355   UROBILINOGEN 0.2 07/31/2014 0355   NITRITE NEGATIVE 07/31/2014 0355   LEUKOCYTESUR TRACE* 07/31/2014 0355     RADIOLOGY: Ct Head Wo Contrast  04/02/2015  CLINICAL DATA:  Multiple seizures today. Struck head. Abrasions to the face and tongue. Patient recently stopped drinking trying to detox. EXAM: CT HEAD WITHOUT CONTRAST CT MAXILLOFACIAL WITHOUT CONTRAST TECHNIQUE: Multidetector CT imaging of the head and maxillofacial structures were performed using the standard protocol without intravenous contrast. Multiplanar CT image reconstructions of the maxillofacial structures were also generated. COMPARISON:  CT head 09/17/2006 FINDINGS: CT HEAD FINDINGS The ventricles and sulci appear symmetrical. No significant ventricular dilatation. No mass effect or midline shift. No abnormal extra-axial fluid collections. Gray-white matter junctions are distinct. Basal cisterns are not effaced. No evidence of acute intracranial hemorrhage. No depressed skull fractures. Visualized paranasal sinuses and mastoid air cells are not opacified. CT MAXILLOFACIAL FINDINGS The globes and extraocular muscles appear intact and symmetrical. Mucosal thickening in the floor of the right maxillary antrum. Paranasal sinuses are otherwise clear. No acute air-fluid levels. The frontal bones, orbital rims, nasal bones, maxillary antral walls, zygomatic arches, pterygoid plates, temporomandibular joints, and mandibles appear intact. No acute displaced fractures are identified. Previous tooth extractions. Circumscribed cystic lesion in the subcutaneous fat inferior to the left mandible measuring 2.3 cm maximal diameter. This likely  represents a sebaceous cyst. IMPRESSION: No acute intracranial abnormalities. No acute displaced orbital or facial fractures identified. Electronically  Signed   By: Burman Nieves M.D.   On: 04/02/2015 00:33   Ct Maxillofacial Wo Cm  04/02/2015  CLINICAL DATA:  Multiple seizures today. Struck head. Abrasions to the face and tongue. Patient recently stopped drinking trying to detox. EXAM: CT HEAD WITHOUT CONTRAST CT MAXILLOFACIAL WITHOUT CONTRAST TECHNIQUE: Multidetector CT imaging of the head and maxillofacial structures were performed using the standard protocol without intravenous contrast. Multiplanar CT image reconstructions of the maxillofacial structures were also generated. COMPARISON:  CT head 09/17/2006 FINDINGS: CT HEAD FINDINGS The ventricles and sulci appear symmetrical. No significant ventricular dilatation. No mass effect or midline shift. No abnormal extra-axial fluid collections. Gray-white matter junctions are distinct. Basal cisterns are not effaced. No evidence of acute intracranial hemorrhage. No depressed skull fractures. Visualized paranasal sinuses and mastoid air cells are not opacified. CT MAXILLOFACIAL FINDINGS The globes and extraocular muscles appear intact and symmetrical. Mucosal thickening in the floor of the right maxillary antrum. Paranasal sinuses are otherwise clear. No acute air-fluid levels. The frontal bones, orbital rims, nasal bones, maxillary antral walls, zygomatic arches, pterygoid plates, temporomandibular joints, and mandibles appear intact. No acute displaced fractures are identified. Previous tooth extractions. Circumscribed cystic lesion in the subcutaneous fat inferior to the left mandible measuring 2.3 cm maximal diameter. This likely represents a sebaceous cyst. IMPRESSION: No acute intracranial abnormalities. No acute displaced orbital or facial fractures identified. Electronically Signed   By: Burman Nieves M.D.   On: 04/02/2015 00:33    EKG: Orders  placed or performed during the hospital encounter of 04/01/15  . EKG 12-Lead  . EKG 12-Lead    IMPRESSION AND PLAN: 35 year old male patient with history of alcohol abuse,, pancreatitis presented to the emergency room for fall and seizure. Admitting diagnosis 1. Alcohol withdrawal seizure 2. Alcohol withdrawal 3 acute on chronic pancreatitis 4. Dehydration 5. Abnormal liver function tests Treatment plan Admit patient to medical floor under inpatient service IV fluids banana bag with thiamine and folate GI prophylaxis with oral Protonix ciwa protocol for alcohol withdrawal When necessary IV Ativan for seizure Follow-up lipase level Follow-up liver function tests Alcohol cessation counseled to the patient Supportive care. All the records are reviewed and case discussed with ED provider. Management plans discussed with the patient, family and they are in agreement.  CODE STATUS:FULL    Code Status Orders        Start     Ordered   04/01/15 2332  Full code   Continuous     04/01/15 2335    Code Status History    Date Active Date Inactive Code Status Order ID Comments User Context   07/31/2014 10:49 AM 08/07/2014  3:19 PM Full Code [id number]  Ron Parker, MD Inpatient       TOTAL TIME TAKING CARE OF THIS PATIENT: 52 minutes.    Ihor Austin M.D on 04/02/2015 at 3:25 AM  Between 7am to 6pm - Pager - [phone]  After 6pm go to [email or web address] - password [email or web address] [facility]  [place] [facility]  Office  [phone]  CC: Primary care physician; No primary care provider on file.

## 2015-04-02 NOTE — BHH Counselor (Signed)
Pt to be admitted to medical.

## 2015-04-02 NOTE — Care Management Note (Signed)
Case Management Note  Patient Details  Name: Yolanda Dockendorf MRN: [id number] Date of Birth: February 02, 1980  Subjective/Objective:                  Met with patient and his mother at bedside. Patient lives with his parents and depends on them for transportation. He has had his first seizures related to ETOH abuse. He is independent with ambulation. He does not have a PCP. He agrees to following up with [facility]. Mother says "he can't keep a job" related to alcohol abuse.   Action/Plan: I have sent referral to [facility]. Application to [facility] and Medication management delivered to patient. Information delivered to patient regarding [facility].   Expected Discharge Date:                  Expected Discharge Plan:     In-House Referral:     Discharge planning Services  CM Consult, [facility]  Post Acute Care Choice:    Choice offered to:  Patient, Parent  DME Arranged:    DME Agency:     HH Arranged:    [place] Agency:     Status of Service:  Completed, signed off  Medicare Important Message Given:    Date Medicare IM Given:    Medicare IM give by:    Date Additional Medicare IM Given:    Additional Medicare Important Message give by:     If discussed at [place] of Stay Meetings, dates discussed:    Additional Comments:  Marshell Garfinkel, RN 04/02/2015, 11:02 AM

## 2015-04-02 NOTE — ED Notes (Signed)
Report from Grace, RN

## 2015-04-02 NOTE — ED Notes (Addendum)
Pt has seizure pads on bed, family at bedside. Abrasions to forehead cleansed and dried blood removed with 4X4 gauze. Scabs forming to top abrasion, painful to scrub per patient. No bleeding of either abrasion present at this time.

## 2015-04-03 DIAGNOSIS — F10931 Alcohol use, unspecified with withdrawal delirium: Secondary | ICD-10-CM

## 2015-04-03 DIAGNOSIS — F10231 Alcohol dependence with withdrawal delirium: Principal | ICD-10-CM

## 2015-04-03 DIAGNOSIS — K861 Other chronic pancreatitis: Secondary | ICD-10-CM

## 2015-04-03 LAB — CBC
HEMATOCRIT: 37.9 % — AB (ref 40.0–52.0)
HEMOGLOBIN: 12.7 g/dL — AB (ref 13.0–18.0)
MCH: 34.4 pg — ABNORMAL HIGH (ref 26.0–34.0)
MCHC: 33.5 g/dL (ref 32.0–36.0)
MCV: 102.6 fL — ABNORMAL HIGH (ref 80.0–100.0)
Platelets: 53 10*3/uL — ABNORMAL LOW (ref 150–440)
RBC: 3.7 MIL/uL — AB (ref 4.40–5.90)
RDW: 13.3 % (ref 11.5–14.5)
WBC: 6.2 10*3/uL (ref 3.8–10.6)

## 2015-04-03 LAB — LIPASE, BLOOD: LIPASE: 117 U/L — AB (ref 11–51)

## 2015-04-03 MED ORDER — LORAZEPAM 2 MG/ML IJ SOLN
2.0000 mg | INTRAMUSCULAR | Status: DC | PRN
  Administered 2015-04-03 – 2015-04-05 (×11): 2 mg via INTRAVENOUS
  Filled 2015-04-03 (×12): qty 1

## 2015-04-03 MED ORDER — LORAZEPAM 2 MG/ML IJ SOLN
4.0000 mg | Freq: Once | INTRAMUSCULAR | Status: AC
  Administered 2015-04-03: 2 mg via INTRAVENOUS

## 2015-04-03 MED ORDER — CYCLOSPORINE 0.05 % OP EMUL
1.0000 [drp] | Freq: Two times a day (BID) | OPHTHALMIC | Status: DC
  Administered 2015-04-03 – 2015-04-07 (×7): 1 [drp] via OPHTHALMIC
  Filled 2015-04-03 (×16): qty 1

## 2015-04-03 MED ORDER — NICOTINE 21 MG/24HR TD PT24
21.0000 mg | MEDICATED_PATCH | Freq: Every day | TRANSDERMAL | Status: DC
  Administered 2015-04-04 – 2015-04-08 (×5): 21 mg via TRANSDERMAL
  Filled 2015-04-03 (×5): qty 1

## 2015-04-03 NOTE — Progress Notes (Signed)
Patient sister  requested that safety rounder not come back in the room tonight. She stated that it was disturbing them. I explained to her that the safety sitter was for his safety, she acknowledge and still requested not to have no more.

## 2015-04-03 NOTE — Progress Notes (Signed)
Patient shows increased signs of withdrawal.  PRN haldol and ativan seems too temporarily relive symptoms.  Shows signs of combativeness and risk for falls.  Floor mat in place. Rounder in place.

## 2015-04-03 NOTE — Progress Notes (Signed)
[facility] [place] [facility]   PATIENT NAME: Jerry Dunn    MR#:  [id number]  DATE OF BIRTH:  03-Mar-1980  SUBJECTIVE:  CHIEF COMPLAINT:   Chief Complaint  Patient presents with  . Seizures    Have tachycardia today, completely disoriented, and agitated, having hallucinations. Trying to get out of bed. ( before 11 am)  REVIEW OF SYSTEMS:   Can not give ROS as he is having DT's.   Later seen again at 3 pm- and sleepy.   ROS  DRUG ALLERGIES:   Allergies  Allergen Reactions  . Bee Venom Swelling    VITALS:  Blood pressure 101/82, pulse 85, temperature 98.4 F (36.9 C), temperature source Axillary, resp. rate 19, height  (1.702 m), weight 67.767 kg (149 lb 6.4 oz), SpO2 100 %.  PHYSICAL EXAMINATION:  GENERAL:  35 y.o.-year-old patient lying in the bed with agitation.  EYES: Pupils equal, round, reactive to light. No scleral icterus. Extraocular muscles intact.  HEENT: Head has trauma marks on nose and forehead, normocephalic. Oropharynx and nasopharynx clear.  NECK:  Supple, no jugular venous distention. No thyroid enlargement, no tenderness.  LUNGS: Normal breath sounds bilaterally, no wheezing, rales,rhonchi or crepitation. No use of accessory muscles of respiration.  CARDIOVASCULAR: S1, S2 normal,tachycardia. No murmurs, rubs, or gallops.  ABDOMEN: Soft, nontender, nondistended. Bowel sounds present. No organomegaly or mass.  EXTREMITIES: No pedal edema, cyanosis, or clubbing.  NEUROLOGIC: Cranial nerves II through XII are intact. Muscle strength 5/5 in all extremities. Sensation intact. Gait not checked. tremors present. PSYCHIATRIC: The patient is alert and agitated with complete disorientation.  SKIN: No obvious rash, lesion, or ulcer.   Physical Exam LABORATORY PANEL:   CBC  Recent Labs Lab 04/03/15 0830  WBC 6.2  HGB 12.7*  HCT 37.9*  PLT 53*    ------------------------------------------------------------------------------------------------------------------  Chemistries   Recent Labs Lab 04/02/15 0630  NA 136  K 3.5  CL 99*  CO2 23  GLUCOSE 102*  BUN 6  CREATININE 0.64  CALCIUM 8.8*  MG 1.6*  AST 310*  ALT 142*  ALKPHOS 90  BILITOT 1.8*   ------------------------------------------------------------------------------------------------------------------  Cardiac Enzymes No results for input(s): TROPONINI in the last 168 hours. ------------------------------------------------------------------------------------------------------------------  RADIOLOGY:  Ct Head Wo Contrast  04/02/2015  CLINICAL DATA:  Multiple seizures today. Struck head. Abrasions to the face and tongue. Patient recently stopped drinking trying to detox. EXAM: CT HEAD WITHOUT CONTRAST CT MAXILLOFACIAL WITHOUT CONTRAST TECHNIQUE: Multidetector CT imaging of the head and maxillofacial structures were performed using the standard protocol without intravenous contrast. Multiplanar CT image reconstructions of the maxillofacial structures were also generated. COMPARISON:  CT head 09/17/2006 FINDINGS: CT HEAD FINDINGS The ventricles and sulci appear symmetrical. No significant ventricular dilatation. No mass effect or midline shift. No abnormal extra-axial fluid collections. Gray-white matter junctions are distinct. Basal cisterns are not effaced. No evidence of acute intracranial hemorrhage. No depressed skull fractures. Visualized paranasal sinuses and mastoid air cells are not opacified. CT MAXILLOFACIAL FINDINGS The globes and extraocular muscles appear intact and symmetrical. Mucosal thickening in the floor of the right maxillary antrum. Paranasal sinuses are otherwise clear. No acute air-fluid levels. The frontal bones, orbital rims, nasal bones, maxillary antral walls, zygomatic arches, pterygoid plates, temporomandibular joints, and mandibles appear intact.  No acute displaced fractures are identified. Previous tooth extractions. Circumscribed cystic lesion in the subcutaneous fat inferior to the left mandible measuring 2.3 cm maximal diameter. This likely represents a sebaceous cyst. IMPRESSION: No  acute intracranial abnormalities. No acute displaced orbital or facial fractures identified. Electronically Signed   By: Burman NievesWilliam  Stevens M.D.   On: 04/02/2015 00:33   Ct Maxillofacial Wo Cm  04/02/2015  CLINICAL DATA:  Multiple seizures today. Struck head. Abrasions to the face and tongue. Patient recently stopped drinking trying to detox. EXAM: CT HEAD WITHOUT CONTRAST CT MAXILLOFACIAL WITHOUT CONTRAST TECHNIQUE: Multidetector CT imaging of the head and maxillofacial structures were performed using the standard protocol without intravenous contrast. Multiplanar CT image reconstructions of the maxillofacial structures were also generated. COMPARISON:  CT head 09/17/2006 FINDINGS: CT HEAD FINDINGS The ventricles and sulci appear symmetrical. No significant ventricular dilatation. No mass effect or midline shift. No abnormal extra-axial fluid collections. Gray-white matter junctions are distinct. Basal cisterns are not effaced. No evidence of acute intracranial hemorrhage. No depressed skull fractures. Visualized paranasal sinuses and mastoid air cells are not opacified. CT MAXILLOFACIAL FINDINGS The globes and extraocular muscles appear intact and symmetrical. Mucosal thickening in the floor of the right maxillary antrum. Paranasal sinuses are otherwise clear. No acute air-fluid levels. The frontal bones, orbital rims, nasal bones, maxillary antral walls, zygomatic arches, pterygoid plates, temporomandibular joints, and mandibles appear intact. No acute displaced fractures are identified. Previous tooth extractions. Circumscribed cystic lesion in the subcutaneous fat inferior to the left mandible measuring 2.3 cm maximal diameter. This likely represents a sebaceous cyst.  IMPRESSION: No acute intracranial abnormalities. No acute displaced orbital or facial fractures identified. Electronically Signed   By: Burman NievesWilliam  Stevens M.D.   On: 04/02/2015 00:33    ASSESSMENT AND PLAN:   Principal Problem:   Seizure (HCC)  *  Alcohol withdrawal seizure *  Alcohol withdrawal- delirium tremens     Give IV fluids, Ativan as needed, no more seizures.     Thiamin and folic acid.     On 04/02/15 patient is completely in hallucination and agitation, required Haldol injection 3 times a day and Ativan 2-3 times in last few hours,   change his Ativan frequency to every 2 hours from every 4 hours.   When again followed up around 3 PM seems like it is helping and patient is sleepy with that.    If he gets agitated again we may help to transfer him to stepdown unit with precedex-IV drip.    psychiatric consult.  *  acute on chronic pancreatitis     Not much pain now- asking to try food.     Give soft diet now. *  Dehydration- iv fluids. *  Abnormal liver function tests      Due to chronic alcoholism. *  Thrombocytopenia      Due to alcoholism, monitor.     Continue to drop, stop Lovenox.   All the records are reviewed and case discussed with Care Management/Social Workerr. Management plans discussed with the patient, family and they are in agreement.  CODE STATUS: full  TOTAL TIME TAKING CARE OF THIS PATIENT: 45 critical care minutes.  Critical due to presence of DT's, we may transfer to ICU later if worsens.   POSSIBLE D/C IN 2 DAYS, DEPENDING ON CLINICAL CONDITION.   Altamese DillingVACHHANI, Olanda Boughner M.D on 04/03/2015   Between 7am to 6pm - Pager - [phone]  After 6pm go to [email or web address] - password [email or web address] [facility]  [facility]  Office  [phone]  CC: Primary care physician; No primary care provider on file.  Note: This dictation was prepared with Dragon dictation along with smaller phrase technology. Any transcriptional errors that result  from this  process are unintentional.

## 2015-04-03 NOTE — Progress Notes (Signed)
Clinical Child psychotherapistocial Worker (CSW) discussed case with RN in progression rounds. Patient is in DT's and cannot participate in assessment. CSW requested psych consult. CSW will continue to follow and assist as needed.   Jetta LoutBailey Morgan, LCSW [phone]

## 2015-04-03 NOTE — Progress Notes (Signed)
Patient continues to tug and try to pull out iv. And shows increased agitation with IV tubing in place. No Iv fluids idministered

## 2015-04-03 NOTE — Progress Notes (Signed)
Pt continues with withdrawal symptoms, mildly aggressive, combative at times, pt's father at bedside. Pt restless, multiple attempts to get up, pushing at staff to "get away" from assisting him to BR. Ambulates to BR with SBA, disoriented, confused, suspected hallucinations/delusions. Speech is unclear, pt disoriented to time/situation. 2mg  Haldol given at 0900 for increased agitation. Pt currently resting in bed. Spoke with pt's father who states son has been living with them due to alcoholism and inability to self manage, father states he is "getting fed up". Passed info along to SW. Psych consult added today due to pt's history of behaviors and alcoholism.

## 2015-04-03 NOTE — Consult Note (Signed)
[place] [facility] Consult   Reason for Consult:  Consult for 35 year old man currently in the hospital with delirium tremens. Consult for alcohol abuse Referring Physician:  Buck Mam Patient Identification: Jerry Dunn MRN:  [id number] Principal Diagnosis: Delirium tremens [facility]) Diagnosis:   Patient Active Problem List   Diagnosis Date Noted  . Delirium tremens ([place]) [F10.231] 04/03/2015  . Chronic pancreatitis ([place]) [K86.1] 04/03/2015  . Seizure ([place]) [R56.9] 04/02/2015  . 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 Rectal bleeding [K62.5] 07/31/2014  . Alcohol withdrawal ([place]) [F10.239] 07/31/2014  . Hyponatremia [E87.1] 07/31/2014  . Encephalopathy acute [G93.40] 07/31/2014  . Alcohol abuse [F10.10]     Total Time spent with patient: 45 minutes  Subjective:   Jerry Dunn is a 35 y.o. male patient admitted with "I guess I'm pretty good".  HPI:  Patient interviewed. Several family members including mother and sisters were in the room at the time as well. Patient was clearly cooperative with this. They added some useful extra history. Patient himself is still little confused although right now he is probably about is lucid as he has been all day. Patient came into the hospital having had seizures at home presumably from alcohol withdrawal. Patient says he had intentionally tried to stop drinking a few days ago. He just remembers waking up after the seizures. Family says that he is chronically sick at home. He eats very poorly. Frequently has abdominal pain. Interestingly, although they know that he is a heavy drinker but he saved that he usually avoids ever being around the family and the family never actually sees him drinking. No evidence that he is abusing any other drugs currently. Patient tells me he drinks a lot but is unable to tell me exactly how much. Right now he is still confused and on the border of delirious mess. He says he is feeling okay did not  indicate being acutely depressed. Family is concerned that he has chronic mood problems.  Social history: He lives with his parents. Doesn't work. Apparently hasn't worked in quite a while.  Medical history: Patient has had recurrent episodes of pancreatitis related to drinking. Episodes of hyponatremia and GI bleeding. Currently in the hospital having had alcohol withdrawal seizures and DTs.  Substance abuse history: Long-standing alcohol abuse area I didn't get into exactly how long but he has had several times he's had to go to the hospital for acute alcohol related problems. He has never been in any kind of serious alcohol treatment program. He went to [facility] for detox last year. Patient claims he is never had DTs in the past.  Past Psychiatric History: Evidently when he went to [facility] for detox last year he was diagnosed with bipolar disorder. We don't have any other details at the moment. Family believes that he has "underlying" mental health problems but given how long inconsistently he's been drinking at think it's probably hard to be sure of that. A diagnosis of bipolar disorder from [facility] really doesn't tell me very much. No past suicide attempts that we can identify or history of violence. [facility] he was prescribed lamotrigine when he was there but never followed up with outpatient treatment.  Risk to Self: Is patient at risk for suicide?: No Risk to Others:   Prior Inpatient Therapy:   Prior Outpatient Therapy:    Past Medical History:  Past Medical History  Diagnosis Date  . Pancreatitis   . Alcohol abuse     Past Surgical History  Procedure Laterality Date  . None     Family History:  Family History  Problem Relation Age of Onset  . Diabetes Mother   . Hypertension Father    Family Psychiatric  History: No known family history Social History:  History  Alcohol Use  . 0.0 oz/week  . 0 Standard drinks or equivalent per week    Comment: 10 beers  daily     History  Drug Use No    Social History   Social History  . Marital Status: Single    Spouse Name: N/A  . Number of Children: N/A  . Years of Education: N/A   Occupational History  . unemployed    Social History Main Topics  . Smoking status: Current Every Day Smoker -- 1.00 packs/day for 16 years    Types: Cigarettes  . Smokeless tobacco: None  . Alcohol Use: 0.0 oz/week    0 Standard drinks or equivalent per week     Comment: 10 beers daily  . Drug Use: No  . Sexual Activity: Not Asked   Other Topics Concern  . None   Social History Narrative   Lives with parents at home   Additional Social History:    Allergies:   Allergies  Allergen Reactions  . Bee Venom Swelling    Labs:  Results for orders placed or performed during the hospital encounter of 04/01/15 (from the past 48 hour(s))  CBC     Status: Abnormal   Collection Time: 04/01/15 11:47 PM  Result Value Ref Range   WBC 7.9 3.8 - 10.6 K/uL   RBC 4.02 (L) 4.40 - 5.90 MIL/uL   Hemoglobin 13.9 13.0 - 18.0 g/dL   HCT 41.9 40.0 - 52.0 %   MCV 104.3 (H) 80.0 - 100.0 fL   MCH 34.6 (H) 26.0 - 34.0 pg   MCHC 33.2 32.0 - 36.0 g/dL   RDW 14.2 11.5 - 14.5 %   Platelets 88 (L) 150 - 440 K/uL  Comprehensive metabolic panel     Status: Abnormal   Collection Time: 04/01/15 11:47 PM  Result Value Ref Range   Sodium 138 135 - 145 mmol/L   Potassium 3.8 3.5 - 5.1 mmol/L   Chloride 94 (L) 101 - 111 mmol/L   CO2 23 22 - 32 mmol/L   Glucose, Bld 138 (H) 65 - 99 mg/dL   BUN 8 6 - 20 mg/dL   Creatinine, Ser 0.78 0.61 - 1.24 mg/dL   Calcium 10.5 (H) 8.9 - 10.3 mg/dL   Total Protein 9.2 (H) 6.5 - 8.1 g/dL   Albumin 5.1 (H) 3.5 - 5.0 g/dL   AST 449 (H) 15 - 41 U/L   ALT 193 (H) 17 - 63 U/L   Alkaline Phosphatase 119 38 - 126 U/L   Total Bilirubin 1.8 (H) 0.3 - 1.2 mg/dL   GFR calc non Af Amer >60 >60 mL/min   GFR calc Af Amer >60 >60 mL/min    Comment: (NOTE) The eGFR has been calculated using the CKD EPI  equation. This calculation has not been validated in all clinical situations. eGFR's persistently <60 mL/min signify possible Chronic Kidney Disease.    Anion gap 21 (H) 5 - 15  Lipase, blood     Status: Abnormal   Collection Time: 04/01/15 11:47 PM  Result Value Ref Range   Lipase 178 (H) 11 - 51 U/L  Urine Drug Screen, Qualitative ([facility] only)     Status: None   Collection Time: 04/02/15  12:22 AM  Result Value Ref Range   Tricyclic, Ur Screen NONE DETECTED NONE DETECTED   Amphetamines, Ur Screen NONE DETECTED NONE DETECTED   MDMA (Ecstasy)Ur Screen NONE DETECTED NONE DETECTED   Cocaine Metabolite,Ur [place] NONE DETECTED NONE DETECTED   Opiate, Ur Screen NONE DETECTED NONE DETECTED   Phencyclidine (PCP) Ur S NONE DETECTED NONE DETECTED   Cannabinoid 50 Ng, Ur [place] NONE DETECTED NONE DETECTED   Barbiturates, Ur Screen NONE DETECTED NONE DETECTED   Benzodiazepine, Ur Scrn NONE DETECTED NONE DETECTED   Methadone Scn, Ur NONE DETECTED NONE DETECTED    Comment: (NOTE) 124  Tricyclics, urine               Cutoff 1000 ng/mL 200  Amphetamines, urine             Cutoff 1000 ng/mL 300  MDMA (Ecstasy), urine           Cutoff 500 ng/mL 400  Cocaine Metabolite, urine       Cutoff 300 ng/mL 500  Opiate, urine                   Cutoff 300 ng/mL 600  Phencyclidine (PCP), urine      Cutoff 25 ng/mL 700  Cannabinoid, urine              Cutoff 50 ng/mL 800  Barbiturates, urine             Cutoff 200 ng/mL 900  Benzodiazepine, urine           Cutoff 200 ng/mL 1000 Methadone, urine                Cutoff 300 ng/mL 1100 1200 The urine drug screen provides only a preliminary, unconfirmed 1300 analytical test result and should not be used for non-medical 1400 purposes. Clinical consideration and professional judgment should 1500 be applied to any positive drug screen result due to possible 1600 interfering substances. A more specific alternate chemical method 1700 must be used in order to obtain a confirmed  analytical result.  1800 Gas chromato graphy / mass spectrometry (GC/MS) is the preferred 1900 confirmatory method.   Ethanol     Status: None   Collection Time: 04/02/15 12:22 AM  Result Value Ref Range   Alcohol, Ethyl (B) <5 <5 mg/dL    Comment:        LOWEST DETECTABLE LIMIT FOR SERUM ALCOHOL IS 5 mg/dL FOR MEDICAL PURPOSES ONLY   Salicylate level     Status: None   Collection Time: 04/02/15 12:22 AM  Result Value Ref Range   Salicylate Lvl <5.8 2.8 - 30.0 mg/dL  Acetaminophen level     Status: Abnormal   Collection Time: 04/02/15 12:22 AM  Result Value Ref Range   Acetaminophen (Tylenol), Serum <10 (L) 10 - 30 ug/mL    Comment:        THERAPEUTIC CONCENTRATIONS VARY SIGNIFICANTLY. A RANGE OF 10-30 ug/mL MAY BE AN EFFECTIVE CONCENTRATION FOR MANY PATIENTS. HOWEVER, SOME ARE BEST TREATED AT CONCENTRATIONS OUTSIDE THIS RANGE. ACETAMINOPHEN CONCENTRATIONS >150 ug/mL AT 4 HOURS AFTER INGESTION AND >50 ug/mL AT 12 HOURS AFTER INGESTION ARE OFTEN ASSOCIATED WITH TOXIC REACTIONS.   Magnesium     Status: Abnormal   Collection Time: 04/02/15  6:30 AM  Result Value Ref Range   Magnesium 1.6 (L) 1.7 - 2.4 mg/dL  Comprehensive metabolic panel     Status: Abnormal   Collection Time: 04/02/15  6:30 AM  Result Value  Ref Range   Sodium 136 135 - 145 mmol/L   Potassium 3.5 3.5 - 5.1 mmol/L   Chloride 99 (L) 101 - 111 mmol/L   CO2 23 22 - 32 mmol/L   Glucose, Bld 102 (H) 65 - 99 mg/dL   BUN 6 6 - 20 mg/dL   Creatinine, Ser 0.64 0.61 - 1.24 mg/dL   Calcium 8.8 (L) 8.9 - 10.3 mg/dL   Total Protein 7.7 6.5 - 8.1 g/dL   Albumin 4.2 3.5 - 5.0 g/dL   AST 310 (H) 15 - 41 U/L   ALT 142 (H) 17 - 63 U/L   Alkaline Phosphatase 90 38 - 126 U/L   Total Bilirubin 1.8 (H) 0.3 - 1.2 mg/dL   GFR calc non Af Amer >60 >60 mL/min   GFR calc Af Amer >60 >60 mL/min    Comment: (NOTE) The eGFR has been calculated using the CKD EPI equation. This calculation has not been validated in all  clinical situations. eGFR's persistently <60 mL/min signify possible Chronic Kidney Disease.    Anion gap 14 5 - 15  CBC     Status: Abnormal   Collection Time: 04/02/15  6:30 AM  Result Value Ref Range   WBC 6.2 3.8 - 10.6 K/uL   RBC 3.39 (L) 4.40 - 5.90 MIL/uL   Hemoglobin 11.8 (L) 13.0 - 18.0 g/dL   HCT 34.2 (L) 40.0 - 52.0 %   MCV 100.7 (H) 80.0 - 100.0 fL   MCH 34.7 (H) 26.0 - 34.0 pg   MCHC 34.5 32.0 - 36.0 g/dL   RDW 13.8 11.5 - 14.5 %   Platelets 64 (L) 150 - 440 K/uL  Lipase, blood     Status: Abnormal   Collection Time: 04/03/15  8:30 AM  Result Value Ref Range   Lipase 117 (H) 11 - 51 U/L  CBC     Status: Abnormal   Collection Time: 04/03/15  8:30 AM  Result Value Ref Range   WBC 6.2 3.8 - 10.6 K/uL   RBC 3.70 (L) 4.40 - 5.90 MIL/uL   Hemoglobin 12.7 (L) 13.0 - 18.0 g/dL   HCT 37.9 (L) 40.0 - 52.0 %   MCV 102.6 (H) 80.0 - 100.0 fL   MCH 34.4 (H) 26.0 - 34.0 pg   MCHC 33.5 32.0 - 36.0 g/dL   RDW 13.3 11.5 - 14.5 %   Platelets 53 (L) 150 - 440 K/uL    Current Facility-Administered Medications  Medication Dose Route Frequency Provider Last Rate Last Dose  . 0.9 %  sodium chloride infusion   Intravenous Continuous Saundra Shelling, MD 125 mL/hr at 04/02/15 1800    . acetaminophen (TYLENOL) tablet 650 mg  650 mg Oral Q4H PRN Loney Hering, MD      . alum & mag hydroxide-simeth (MAALOX/MYLANTA) 200-200-20 MG/5ML suspension 30 mL  30 mL Oral PRN Loney Hering, MD      . cholecalciferol (VITAMIN D) tablet 1,000 Units  1,000 Units Oral Daily Saundra Shelling, MD   1,000 Units at 04/02/15 1042  . cloNIDine (CATAPRES) tablet 0.1 mg  0.1 mg Oral BID Saundra Shelling, MD   0.1 mg at 04/03/15 1000  . folic acid (FOLVITE) tablet 1 mg  1 mg Oral Daily Pavan Pyreddy, MD   1 mg at 04/02/15 1042  . haloperidol lactate (HALDOL) injection 2 mg  2 mg Intravenous Q6H PRN Gladstone Lighter, MD   2 mg at 04/03/15 0900  . ibuprofen (ADVIL,MOTRIN) tablet 600 mg  600 mg Oral Q8H PRN Loney Hering, MD   600 mg at 04/02/15 1520  . LORazepam (ATIVAN) injection 2 mg  2 mg Intravenous Q2H PRN Vaughan Basta, MD   2 mg at 04/03/15 1600  . ondansetron (ZOFRAN) tablet 4 mg  4 mg Oral Q6H PRN Saundra Shelling, MD       Or  . ondansetron (ZOFRAN) injection 4 mg  4 mg Intravenous Q6H PRN Pavan Pyreddy, MD      . pantoprazole (PROTONIX) EC tablet 40 mg  40 mg Oral Daily Pavan Pyreddy, MD   40 mg at 04/02/15 1042  . sodium chloride flush (NS) 0.9 % injection 3 mL  3 mL Intravenous Q12H Saundra Shelling, MD   3 mL at 04/02/15 2042  . thiamine (VITAMIN B-1) tablet 100 mg  100 mg Oral Daily Saundra Shelling, MD   100 mg at 04/02/15 1042  . vitamin B-12 (CYANOCOBALAMIN) tablet 1,000 mcg  1,000 mcg Oral Daily Saundra Shelling, MD   1,000 mcg at 04/02/15 1042    Musculoskeletal: Strength & Muscle Tone: decreased Gait & Station: unable to stand Patient leans: Backward  Psychiatric Specialty Exam: Review of Systems  Constitutional: Positive for malaise/fatigue.  HENT: Negative.   Eyes: Negative.   Respiratory: Negative.   Cardiovascular: Negative.   Gastrointestinal: Positive for abdominal pain.  Musculoskeletal: Negative.   Skin: Negative.   Neurological: Positive for tremors and weakness.  Psychiatric/Behavioral: Positive for substance abuse. Negative for depression, suicidal ideas and hallucinations. The patient is nervous/anxious and has insomnia.     Blood pressure 101/82, pulse 85, temperature 98.4 F (36.9 C), temperature source Axillary, resp. rate 19, height 5' 7"  (1.702 m), weight 67.767 kg (149 lb 6.4 oz), SpO2 100 %.Body mass index is 23.39 kg/(m^2).  General Appearance: Disheveled  Eye Contact::  Minimal  Speech:  Slow and Slurred  Volume:  Decreased  Mood:  Euthymic  Affect:  Constricted  Thought Process:  Disorganized  Orientation:  Other:  He knew what month it was and what year it was. He did not know for sure where he was. He couldn't identify all of his family  members.  Thought Content:  Hallucinations: Visual  Suicidal Thoughts:  No  Homicidal Thoughts:  No  Memory:  Immediate;   Poor Recent;   Poor Remote;   Poor  Judgement:  Poor  Insight:  Shallow  Psychomotor Activity:  Decreased and Tremor  Concentration:  Poor  Recall:  Poor  Fund of Knowledge:Poor  Language: Poor  Akathisia:  No  Handed:  Right  AIMS (if indicated):     Assets:  Desire for Improvement Housing Social Support  ADL's:  Impaired  Cognition: Impaired,  Moderate  Sleep:      Treatment Plan Summary: Daily contact with patient to assess and evaluate symptoms and progress in treatment, Medication management and Plan 35 year old man was delirium tremens. Currently that's by far the most important thing to be treating. Sounds like today he has had episodes of agitation and confusion trying to get up the risk of falling. Appropriate treatment of course is more benzodiazepine. If this can't be managed safely I agree with the possible use of a Precedex drip. It's best to minimize the use of haloperidol or other antipsychotics in this case because of increased risk of seizures. Longer term patient obviously is drinking himself to death and needs to stop drinking. Once he regains some lucidity we can talk about that. I'm unable to give any opinion  about any other mental health issues under the current circumstances. I will follow as needed.  Disposition: Patient does not meet criteria for psychiatric inpatient admission. Supportive therapy provided about ongoing stressors.  Alethia Berthold, MD 04/03/2015 6:38 PM

## 2015-04-04 LAB — CBC
HEMATOCRIT: 36.7 % — AB (ref 40.0–52.0)
HEMOGLOBIN: 12.6 g/dL — AB (ref 13.0–18.0)
MCH: 35 pg — AB (ref 26.0–34.0)
MCHC: 34.3 g/dL (ref 32.0–36.0)
MCV: 102.2 fL — AB (ref 80.0–100.0)
Platelets: 60 10*3/uL — ABNORMAL LOW (ref 150–440)
RBC: 3.59 MIL/uL — ABNORMAL LOW (ref 4.40–5.90)
RDW: 13.2 % (ref 11.5–14.5)
WBC: 5.6 10*3/uL (ref 3.8–10.6)

## 2015-04-04 MED ORDER — TRIPLE ANTIBIOTIC 3.5-400-5000 EX OINT
TOPICAL_OINTMENT | CUTANEOUS | Status: DC | PRN
  Administered 2015-04-06 – 2015-04-08 (×3): 1 via TOPICAL
  Filled 2015-04-04: qty 15
  Filled 2015-04-04 (×2): qty 1

## 2015-04-04 MED ORDER — DIAZEPAM 2 MG PO TABS
2.0000 mg | ORAL_TABLET | Freq: Two times a day (BID) | ORAL | Status: DC
  Administered 2015-04-05: 2 mg via ORAL
  Filled 2015-04-04: qty 1

## 2015-04-04 MED ORDER — HYDRALAZINE HCL 20 MG/ML IJ SOLN
10.0000 mg | Freq: Four times a day (QID) | INTRAMUSCULAR | Status: DC | PRN
  Administered 2015-04-04 – 2015-04-07 (×4): 10 mg via INTRAVENOUS
  Filled 2015-04-04 (×5): qty 1

## 2015-04-04 NOTE — Progress Notes (Signed)
Pt became more resistant to care, non-compliant with fall risk precautions. Pt becoming increasingly aggressive with staff members. Code 300 called for security, pt resistant to redirection. Pt ststes he wants to leave because "his family is bothering him". Asked family to step out briefly for further assessment. Paged MD. Algis DownsAdvised to give 2mg  Haldol per order and 2mg  Ativan IV PRN for agitation. Sitter ordered for safety/elopment risk. Medication given, pts mother became upset because she stated that "Dr. Toni Amendlapacs told her pt should not get haldol because it causes seizures in DT's".  Pt continues with non-compliance, getting up unassisted, refuses to stay seated or in bed.

## 2015-04-04 NOTE — Progress Notes (Signed)
Patient refuses PO medication. Patient states that he would like to go to store for beer and cigarettes. Family at bedside states that they would take patient once D/C.Education giving.Sitter at bedside. No acute distress noted.

## 2015-04-04 NOTE — Progress Notes (Signed)
Pt continues with restless confused behavior. Pt called 911 from his personal cell phone and stated" his house was being broken into". This Clinical research associatewriter spoke with dispatcher, advised there was no emergency. Confiscated pt's cell phone and placed in a drawer out of site in room. Notified family.

## 2015-04-04 NOTE — Progress Notes (Signed)
[facility] [place] [facility]   PATIENT NAME: Aristeo Hankerson    MR#:  [id number]  DATE OF BIRTH:  1980-07-26  SUBJECTIVE:  CHIEF COMPLAINT:   Chief Complaint  Patient presents with  . Seizures     After having severe agitation episode yesterday morning when he was started on increased Ativan doses, he had relatively very good last night and this morning. He did not require much ativan since this morning, but later after the lunch he was severely agitated- and combative with the staff.  REVIEW OF SYSTEMS:   Can not give ROS as he is having DT's.     ROS  DRUG ALLERGIES:   Allergies  Allergen Reactions  . Bee Venom Swelling    VITALS:  Blood pressure 139/92, pulse 91, temperature 97.5 F (36.4 C), temperature source Oral, resp. rate 18, height  (1.702 m), weight 67.767 kg (149 lb 6.4 oz), SpO2 100 %.  PHYSICAL EXAMINATION:  GENERAL:  35 y.o.-year-old patient lying in the bed with agitation.  EYES: Pupils equal, round, reactive to light. No scleral icterus. Extraocular muscles intact.  HEENT: Head has trauma marks on nose and forehead, normocephalic. Oropharynx and nasopharynx clear.  NECK:  Supple, no jugular venous distention. No thyroid enlargement, no tenderness.  LUNGS: Normal breath sounds bilaterally, no wheezing, rales,rhonchi or crepitation. No use of accessory muscles of respiration.  CARDIOVASCULAR: S1, S2 normal,tachycardia. No murmurs, rubs, or gallops.  ABDOMEN: Soft, nontender, nondistended. Bowel sounds present. No organomegaly or mass.  EXTREMITIES: No pedal edema, cyanosis, or clubbing.  NEUROLOGIC: Cranial nerves II through XII are intact. Muscle strength 5/5 in all extremities. Sensation intact. Gait not checked. tremors present. PSYCHIATRIC: The patient is alert and agitated with complete disorientation.  SKIN: No obvious rash, lesion, or ulcer.   Physical Exam LABORATORY PANEL:   CBC  Recent Labs Lab  04/04/15 0551  WBC 5.6  HGB 12.6*  HCT 36.7*  PLT 60*   ------------------------------------------------------------------------------------------------------------------  Chemistries   Recent Labs Lab 04/02/15 0630  NA 136  K 3.5  CL 99*  CO2 23  GLUCOSE 102*  BUN 6  CREATININE 0.64  CALCIUM 8.8*  MG 1.6*  AST 310*  ALT 142*  ALKPHOS 90  BILITOT 1.8*   ------------------------------------------------------------------------------------------------------------------  Cardiac Enzymes No results for input(s): TROPONINI in the last 168 hours. ------------------------------------------------------------------------------------------------------------------  RADIOLOGY:  No results found.  ASSESSMENT AND PLAN:   Principal Problem:   Delirium tremens (HCC) Active Problems:   Alcohol withdrawal (HCC)   Alcohol abuse   Seizure (HCC)   Chronic pancreatitis (HCC)  *  Alcohol withdrawal seizure *  Alcohol withdrawal- delirium tremens     Give IV fluids, Ativan as needed, no more seizures.     Thiamin and folic acid.     On 04/02/15 patient is completely in hallucination and agitation, required Haldol injection 3 times a day and Ativan 2-3 times in last few hours,   change his Ativan frequency to every 2 hours from every 4 hours.     Continue giving IV Ativan frequently, try to avoid Haldol if we can.     Appreciated psychiatric consult, updated family members were present in the room.     No more seizures episodes. *  acute on chronic pancreatitis     Not much pain now- asking to try food.     Give soft diet now. Tolerating regular food now. *  Dehydration- iv fluids. *  Abnormal liver function tests  Due to chronic alcoholism. *  Thrombocytopenia      Due to alcoholism, monitor.     Continue to drop, stop Lovenox.   Plt stable now.   All the records are reviewed and case discussed with Care Management/Social Workerr. Management plans discussed with the  patient, family and they are in agreement.  CODE STATUS: full  TOTAL TIME TAKING CARE OF THIS PATIENT: 45 critical care minutes.  Critical due to presence of DT's, we may transfer to ICU later if worsens. Discussed with his mother and sister in the room.  POSSIBLE D/C IN 2 DAYS, DEPENDING ON CLINICAL CONDITION.   Altamese DillingVACHHANI, Lorien Shingler M.D on 04/04/2015   Between 7am to 6pm - Pager - [phone]  After 6pm go to [email or web address] - password [email or web address] [facility]  [facility]  Office  [phone]  CC: Primary care physician; No primary care provider on file.  Note: This dictation was prepared with Dragon dictation along with smaller phrase technology. Any transcriptional errors that result from this process are unintentional.

## 2015-04-04 NOTE — Progress Notes (Signed)
If was informing the patients mom and daughter that the pt would be receiving haldol.  Mom of the patient got very upset and slapped her hand down hard on the counter at the nurses station saying that Dr clapaces said no haldol because it could lead to seizures.  Dr Elisabeth PigeonVachhani notified of this and discontinued the haldol

## 2015-04-04 NOTE — Progress Notes (Signed)
Pt much more aware of self/surroundings this shift. Interactive with staff, answers questions appropriately. Able to take meds whole one at a time. Family at bedside. Ativan 2mg  given PRN for tremors. Spoke with Dr. Greggory BrandyVacchanni, agrees pt improved, possible D/C on Sunday. Will continue to monitor.

## 2015-04-04 NOTE — Progress Notes (Signed)
Sitter in room. Pt continues with restless behavior. Mild tremors noted. Pt's other sister at bedside at this time.

## 2015-04-04 NOTE — Progress Notes (Signed)
Code 300 was called. Clinical Child psychotherapistocial Worker (CSW) will attempt to meet with patient at a later time.   Jetta LoutBailey Morgan, LCSW [phone]

## 2015-04-04 NOTE — Consult Note (Signed)
[facility]   Reason for Consult:  Follow-up for this 35 year old man currently having delirium tremens. Referring Physician:  Esaw GrandchildVachanni Patient Identification: Jerry Dunn MRN:  [id number] Principal Diagnosis: Delirium tremens [facility](HCC) Diagnosis:   Patient Active Problem List   Diagnosis Date Noted  . Delirium tremens (HCC) [F10.231] 04/03/2015  . Chronic pancreatitis (HCC) [K86.1] 04/03/2015  . Seizure (HCC) [R56.9] 04/02/2015  . 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 Rectal bleeding [K62.5] 07/31/2014  . Alcohol withdrawal (HCC) [F10.239] 07/31/2014  . Hyponatremia [E87.1] 07/31/2014  . Encephalopathy acute [G93.40] 07/31/2014  . Alcohol abuse [F10.10]     Total Time spent with patient: 30 minutes  Subjective:   Jerry ElseWilliam Boruff is a [age] y.o. male patient admitted with "I'm not feeling so good".  HPI:  Patient interviewed. Chart reviewed discussed case with nursing. Patient reports to me that he is feeling bad today. He says he is feeling emotionally down and negative but also feeling sick to his stomach and tired. Apparently earlier today he was quite agitated try to get up out of bed had to be restrained partially at times and given a lot more medicine. Patient is in and out mentally during the time on speaking to him. He does indicate that he's had some hallucinations today. Sister is present and has been here all day and says that he has been very agitated earlier this morning but he is a little better now with current medicine. He's been able to take a little bit of fluid. Not acting out in a manner to intentionally harm himself  Past Psychiatric History: History of alcohol abuse history of alcohol withdrawal complicated in the past. Past diagnosis of bipolar disorder unclear symptom complex  Risk to Self: Is patient at risk for suicide?: No Risk to Others:   Prior Inpatient Therapy:   Prior Outpatient Therapy:    Past Medical History:  Past  Medical History  Diagnosis Date  . Pancreatitis   . Alcohol abuse     Past Surgical History  Procedure Laterality Date  . None     Family History:  Family History  Problem Relation Age of Onset  . Diabetes Mother   . Hypertension Father    Family Psychiatric  History: No family history identified Social History:  History  Alcohol Use  . 0.0 oz/week  . 0 Standard drinks or equivalent per week    Comment: 10 beers daily     History  Drug Use No    Social History   Social History  . Marital Status: Single    Spouse Name: N/A  . Number of Children: N/A  . Years of Education: N/A   Occupational History  . unemployed    Social History Main Topics  . Smoking status: Current Every Day Smoker -- 1.00 packs/day for 16 years    Types: Cigarettes  . Smokeless tobacco: None  . Alcohol Use: 0.0 oz/week    0 Standard drinks or equivalent per week     Comment: 10 beers daily  . Drug Use: No  . Sexual Activity: Not Asked   Other Topics Concern  . None   Social History Narrative   Lives with parents at home   Additional Social History:    Allergies:   Allergies  Allergen Reactions  . Bee Venom Swelling    Labs:  Results for orders placed or performed during the hospital encounter of 04/01/15 (from the past 48 hour(s))  Lipase, blood     Status:  Abnormal   Collection Time: 04/03/15  8:30 AM  Result Value Ref Range   Lipase 117 (H) 11 - 51 U/L  CBC     Status: Abnormal   Collection Time: 04/03/15  8:30 AM  Result Value Ref Range   WBC 6.2 3.8 - 10.6 K/uL   RBC 3.70 (L) 4.40 - 5.90 MIL/uL   Hemoglobin 12.7 (L) 13.0 - 18.0 g/dL   HCT 25.3 (L) 66.4 - 40.3 %   MCV 102.6 (H) 80.0 - 100.0 fL   MCH 34.4 (H) 26.0 - 34.0 pg   MCHC 33.5 32.0 - 36.0 g/dL   RDW 47.4 25.9 - 56.3 %   Platelets 53 (L) 150 - 440 K/uL  CBC     Status: Abnormal   Collection Time: 04/04/15  5:51 AM  Result Value Ref Range   WBC 5.6 3.8 - 10.6 K/uL   RBC 3.59 (L) 4.40 - 5.90 MIL/uL    Hemoglobin 12.6 (L) 13.0 - 18.0 g/dL   HCT 87.5 (L) 64.3 - 32.9 %   MCV 102.2 (H) 80.0 - 100.0 fL   MCH 35.0 (H) 26.0 - 34.0 pg   MCHC 34.3 32.0 - 36.0 g/dL   RDW 51.8 84.1 - 66.0 %   Platelets 60 (L) 150 - 440 K/uL    Current Facility-Administered Medications  Medication Dose Route Frequency Provider Last Rate Last Dose  . 0.9 %  sodium chloride infusion   Intravenous Continuous Ihor Austin, MD   Stopped at 04/04/15 1905  . acetaminophen (TYLENOL) tablet 650 mg  650 mg Oral Q4H PRN Rebecka Apley, MD      . alum & mag hydroxide-simeth (MAALOX/MYLANTA) 200-200-20 MG/5ML suspension 30 mL  30 mL Oral PRN Rebecka Apley, MD      . cholecalciferol (VITAMIN D) tablet 1,000 Units  1,000 Units Oral Daily Ihor Austin, MD   1,000 Units at 04/04/15 1253  . cloNIDine (CATAPRES) tablet 0.1 mg  0.1 mg Oral BID Ihor Austin, MD   0.1 mg at 04/03/15 2143  . cycloSPORINE (RESTASIS) 0.05 % ophthalmic emulsion 1 drop  1 drop Both Eyes BID Katha Hamming, MD   1 drop at 04/04/15 1254  . diazepam (VALIUM) tablet 2 mg  2 mg Oral BID Altamese Dilling, MD   2 mg at 04/04/15 0927  . folic acid (FOLVITE) tablet 1 mg  1 mg Oral Daily Pavan Pyreddy, MD   1 mg at 04/04/15 1254  . hydrALAZINE (APRESOLINE) injection 10 mg  10 mg Intravenous Q6H PRN Altamese Dilling, MD   10 mg at 04/04/15 0926  . ibuprofen (ADVIL,MOTRIN) tablet 600 mg  600 mg Oral Q8H PRN Rebecka Apley, MD   600 mg at 04/02/15 1520  . LORazepam (ATIVAN) injection 2 mg  2 mg Intravenous Q2H PRN Altamese Dilling, MD   2 mg at 04/04/15 1841  . neomycin-bacitracin-polymyxin (NEOSPORIN) ointment   Topical PRN Altamese Dilling, MD      . nicotine (NICODERM CQ - dosed in mg/24 hours) patch 21 mg  21 mg Transdermal Daily Katha Hamming, MD   21 mg at 04/04/15 1623  . ondansetron (ZOFRAN) tablet 4 mg  4 mg Oral Q6H PRN Ihor Austin, MD       Or  . ondansetron (ZOFRAN) injection 4 mg  4 mg Intravenous Q6H PRN Pavan  Pyreddy, MD      . pantoprazole (PROTONIX) EC tablet 40 mg  40 mg Oral Daily Pavan Pyreddy, MD   40 mg at  04/04/15 1257  . sodium chloride flush (NS) 0.9 % injection 3 mL  3 mL Intravenous Q12H Pavan Pyreddy, MD   3 mL at 04/04/15 1255  . thiamine (VITAMIN B-1) tablet 100 mg  100 mg Oral Daily Pavan Pyreddy, MD   100 mg at 04/04/15 1254  . vitamin B-12 (CYANOCOBALAMIN) tablet 1,000 mcg  1,000 mcg Oral Daily Ihor Austin, MD   1,000 mcg at 04/04/15 1253    Musculoskeletal: Strength & Muscle Tone: decreased Gait & Station: unsteady Patient leans: N/A  Psychiatric Specialty Exam: Review of Systems  HENT: Negative.   Eyes: Negative.   Respiratory: Negative.   Cardiovascular: Negative.   Gastrointestinal: Positive for nausea.  Musculoskeletal: Negative.   Skin: Negative.   Neurological: Positive for tremors and weakness.  Psychiatric/Behavioral: Positive for hallucinations. Negative for depression and suicidal ideas. The patient is nervous/anxious.     Blood pressure 139/92, pulse 91, temperature 97.5 F (36.4 C), temperature source Oral, resp. rate 18, height  (1.702 m), weight 67.767 kg (149 lb 6.4 oz), SpO2 100 %.Body mass index is 23.39 kg/(m^2).  General Appearance: Disheveled  Eye Solicitor::  Fair  Speech:  Garbled, Slow and Slurred  Volume:  Decreased  Mood:  Dysphoric  Affect:  Constricted  Thought Process:  Disorganized  Orientation:  Negative  Thought Content:  Hallucinations: Visual  Suicidal Thoughts:  No  Homicidal Thoughts:  No  Memory:  Negative  Judgement:  Negative  Insight:  Negative  Psychomotor Activity:  Decreased  Concentration:  Poor  Recall:  Poor  Fund of Knowledge:Poor  Language: Poor  Akathisia:  No  Handed:  Right  AIMS (if indicated):     Assets:  Social Support  ADL's:  Impaired  Cognition: Impaired,  Moderate  Sleep:      Treatment Plan Summary: Medication management and Plan Patient continues to have delirium tremens. Typical  course. He was still tremulous when I saw embedded calm down with Ativan. Reemphasized the patient needs to have as much Ativan as it takes to keep him calm continuously. I will continue to follow up over the weekend.  Disposition: Patient does not meet criteria for psychiatric inpatient admission.  Mordecai Rasmussen, MD 04/04/2015 7:12 PM

## 2015-04-05 LAB — MRSA PCR SCREENING: MRSA by PCR: NEGATIVE

## 2015-04-05 MED ORDER — LORAZEPAM 2 MG/ML IJ SOLN
2.0000 mg | INTRAMUSCULAR | Status: DC | PRN
  Administered 2015-04-05: 2 mg via INTRAVENOUS
  Filled 2015-04-05: qty 1

## 2015-04-05 MED ORDER — DEXMEDETOMIDINE HCL IN NACL 400 MCG/100ML IV SOLN
0.4000 ug/kg/h | INTRAVENOUS | Status: DC
  Administered 2015-04-05: 0.9 ug/kg/h via INTRAVENOUS
  Administered 2015-04-05 – 2015-04-06 (×2): 0.4 ug/kg/h via INTRAVENOUS
  Filled 2015-04-05 (×3): qty 100

## 2015-04-05 MED ORDER — ZIPRASIDONE MESYLATE 20 MG IM SOLR
20.0000 mg | Freq: Once | INTRAMUSCULAR | Status: AC
  Administered 2015-04-05: 20 mg via INTRAMUSCULAR
  Filled 2015-04-05 (×2): qty 20

## 2015-04-05 MED ORDER — LORAZEPAM 2 MG/ML IJ SOLN
3.0000 mg | INTRAMUSCULAR | Status: AC
  Administered 2015-04-05: 3 mg via INTRAVENOUS
  Filled 2015-04-05: qty 2

## 2015-04-05 NOTE — Progress Notes (Signed)
2115 bladder scan revealed >999 mL. Notified Dr. Anne HahnWillis. In and out ordered. Per Dr. Anne HahnWillis if follow up scan reveals >450 obtain order for foley catheter.

## 2015-04-05 NOTE — Progress Notes (Signed)
Patient CIWA controlled with PRN medication. Patient in better mood during my shift. Patient rested throughout the night. Sitter assisted patient with shower. Patient up with supervision. No acute distress noted.

## 2015-04-05 NOTE — Progress Notes (Signed)
Pt not given the geodon 20 mg due to patient being transfer to step down unit and will be on Precedex drip.

## 2015-04-05 NOTE — Progress Notes (Signed)
Patient wanting to leave, unable to keep the patient in room, wander in hallway, patient called 911 x 2, states ' I am being held hostage" Code 300 called, able to get patient in chair. Family at the bedside.

## 2015-04-05 NOTE — Progress Notes (Signed)
Patient became combative after arrived on unit. Was punching at staff. Code 300 called and security arrived while schedule geodon reconstituted and administered. Precedex increased and prn ativan given. When patient calmed down, security personnel left. Family updated at bedside about patient's condition (family were not present at bedside during incident).

## 2015-04-05 NOTE — Progress Notes (Signed)
Spoke with Dr.Vachhani regarding patient's blood pressure 133/100 , heart rate 124. Informed MD Catapres 0.1mg  po given early. Will recheck pt bp and hr in 1 hr.

## 2015-04-05 NOTE — Plan of Care (Signed)
Problem: Physical Regulation: Goal: Ability to maintain clinical measurements within normal limits will improve Outcome: Progressing Patient with precedex drip infusion. Titrated down cautiously during shift due to violent outburst soon after arrived on unit. Family updated by RN and MDs including psychiatry. Patient with IV fluids, not eating or drinking at this time due to sedation. Resting comfortably at this time. Condom catheter placed after incontinent void.

## 2015-04-05 NOTE — Progress Notes (Addendum)
Spoke with Clapacs regarding patient's behavior, order received to change the ativan q1hrs prn for agitation, sister speaking with Cheryl,RN regarding pt's condition, informed MD the sister is wanting to have patient IVC.  MD to come round later today.

## 2015-04-05 NOTE — Progress Notes (Signed)
Patient taken to step down unit via recliner chair, security/Cheryl at side.

## 2015-04-05 NOTE — Progress Notes (Signed)
Clinical Child psychotherapistocial Worker (CSW) attempted to meet with patient and provide substance abuse resources however a security code was called and multiple nurses are in patient's room. CSW will attempt to meet with patient at a later time.   Jetta LoutBailey Morgan, LCSW [phone]

## 2015-04-05 NOTE — Consult Note (Signed)
  Psychiatry: Follow-up consult note following this 35 year old man with delirium tremens. Spoke with the hospitalist as well as the current admission hospitalist on duty and also spoke with nursing and the patient's sister. Patient seen but by the time I got there he was sedated and on a Precedex drip. He has continued to have intermittent agitation with confusion during which time he has been aggressive at times to staff and been trying to leave. Typical behavior for delirium tremens. By the time I saw him he is completely sedated and I did not try to forcibly wake him up. Vital signs of continued to run high especially with elevated blood pressure but things are looking better now that he is sedated.  As I communicated to the hospitalists earlier, I strongly agree with the plan to bring him to the critical care unit and use a Precedex drip. This patient's delirium tremens are too difficult to manage in the ward setting. The problem there is that the orders for when necessary Ativan and up leaving the patient without medication sometimes for hours and by the time and becomes apparent that they need something it is often too late and they have already become disruptive.  I recommend continuing Precedex with an eye towards only gradually decreasing this and planning for probably at least another couple days before he is likely to be well enough to wake up completely.  The idea was raised about commitment paperwork. I would be glad to file commitment paperwork if necessary but right now with him being on the drip it should not be necessary as they can contract control his level of sedation. I will continue to follow-up regularly. Plan communicated to the family as well.

## 2015-04-06 NOTE — Consult Note (Signed)
[facility]   Reason for Consult:  Follow-up consult 35 year old man with alcohol abuse going through delirium tremens also history of chronic mood disorder. Referring Physician:  Madelon LipsVacchani Patient Identification: Jerry ElseWilliam Trella MRN:  [id number] Principal Diagnosis: Delirium tremens [facility](HCC) Diagnosis:   Patient Active Problem List   Diagnosis Date Noted  . Delirium tremens (HCC) [F10.231] 04/03/2015  . Chronic pancreatitis (HCC) [K86.1] 04/03/2015  . Seizure (HCC) [R56.9] 04/02/2015  . 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 Rectal bleeding [K62.5] 07/31/2014  . Alcohol withdrawal (HCC) [F10.239] 07/31/2014  . Hyponatremia [E87.1] 07/31/2014  . Encephalopathy acute [G93.40] 07/31/2014  . Alcohol abuse [F10.10]     Total Time spent with patient: 35 minutes  Subjective:   Jerry Dunn is a 35 y.o. male patient admitted with patient states he is feeling better today.Marland Kitchen.  HPI:  On interview today the patient says his mood is feeling better. He is more alert and oriented. He knows where he is today. Knows why he is in the hospital. Denies any suicidal thoughts denies any hallucinations. He has now been off of his Precedex drip for about 3-1/2 hours according to nursing. Patient interviewed. Chart reviewed. Case discussed with nursing. We tried to evaluate his DTs and the appropriate treatment going forward and I spent some time talking with him about his mood history. He has a history of recurrent severe depression but also of episodes of what sound like they may be hypomania although they were also present during times when he was drinking. He did have a trial of lamotrigine without much benefit in the past but it never got above 50 mg.  Past Psychiatric History: As noted above he is only had one trial of medication with lamotrigine and Seroquel. He didn't find it made a difference to his mood. He has only ever been able stop drinking for a few weeks at a  time.  Risk to Self: Is patient at risk for suicide?: No Risk to Others:   Prior Inpatient Therapy:   Prior Outpatient Therapy:    Past Medical History:  Past Medical History  Diagnosis Date  . Pancreatitis   . Alcohol abuse     Past Surgical History  Procedure Laterality Date  . None     Family History:  Family History  Problem Relation Age of Onset  . Diabetes Mother   . Hypertension Father    Family Psychiatric  History: No family history reported of mood or substance abuse problems Social History:  History  Alcohol Use  . 0.0 oz/week  . 0 Standard drinks or equivalent per week    Comment: 10 beers daily     History  Drug Use No    Social History   Social History  . Marital Status: Single    Spouse Name: N/A  . Number of Children: N/A  . Years of Education: N/A   Occupational History  . unemployed    Social History Main Topics  . Smoking status: Current Every Day Smoker -- 1.00 packs/day for 16 years    Types: Cigarettes  . Smokeless tobacco: None  . Alcohol Use: 0.0 oz/week    0 Standard drinks or equivalent per week     Comment: 10 beers daily  . Drug Use: No  . Sexual Activity: Not Asked   Other Topics Concern  . None   Social History Narrative   Lives with parents at home   Additional Social History:    Allergies:   Allergies  Allergen Reactions  . Bee Venom Swelling    Labs:  Results for orders placed or performed during the hospital encounter of 04/01/15 (from the past 48 hour(s))  MRSA PCR Screening     Status: None   Collection Time: 04/05/15  2:04 PM  Result Value Ref Range   MRSA by PCR NEGATIVE NEGATIVE    Comment:        The GeneXpert MRSA Assay (FDA approved for NASAL specimens only), is one component of a comprehensive MRSA colonization surveillance program. It is not intended to diagnose MRSA infection nor to guide or monitor treatment for MRSA infections.     Current Facility-Administered Medications   Medication Dose Route Frequency Provider Last Rate Last Dose  . 0.9 %  sodium chloride infusion   Intravenous Continuous Ihor Austin, MD 125 mL/hr at 04/06/15 0542    . acetaminophen (TYLENOL) tablet 650 mg  650 mg Oral Q4H PRN Rebecka Apley, MD      . alum & mag hydroxide-simeth (MAALOX/MYLANTA) 200-200-20 MG/5ML suspension 30 mL  30 mL Oral PRN Rebecka Apley, MD      . cholecalciferol (VITAMIN D) tablet 1,000 Units  1,000 Units Oral Daily Ihor Austin, MD   1,000 Units at 04/06/15 0913  . cloNIDine (CATAPRES) tablet 0.1 mg  0.1 mg Oral BID Ihor Austin, MD   0.1 mg at 04/06/15 0912  . cycloSPORINE (RESTASIS) 0.05 % ophthalmic emulsion 1 drop  1 drop Both Eyes BID Katha Hamming, MD   1 drop at 04/06/15 0922  . dexmedetomidine (PRECEDEX) 400 MCG/100ML (4 mcg/mL) infusion  0.4-1.2 mcg/kg/hr Intravenous Titrated Altamese Dilling, MD 6.8 mL/hr at 04/06/15 0035 0.4 mcg/kg/hr at 04/06/15 0035  . folic acid (FOLVITE) tablet 1 mg  1 mg Oral Daily Pavan Pyreddy, MD   1 mg at 04/06/15 0912  . hydrALAZINE (APRESOLINE) injection 10 mg  10 mg Intravenous Q6H PRN Altamese Dilling, MD   10 mg at 04/05/15 2242  . ibuprofen (ADVIL,MOTRIN) tablet 600 mg  600 mg Oral Q8H PRN Rebecka Apley, MD   600 mg at 04/06/15 1203  . LORazepam (ATIVAN) injection 2 mg  2 mg Intravenous Q1H PRN Audery Amel, MD   2 mg at 04/05/15 1417  . nicotine (NICODERM CQ - dosed in mg/24 hours) patch 21 mg  21 mg Transdermal Daily Katha Hamming, MD   21 mg at 04/06/15 0922  . ondansetron (ZOFRAN) tablet 4 mg  4 mg Oral Q6H PRN Ihor Austin, MD       Or  . ondansetron (ZOFRAN) injection 4 mg  4 mg Intravenous Q6H PRN Pavan Pyreddy, MD      . pantoprazole (PROTONIX) EC tablet 40 mg  40 mg Oral Daily Ihor Austin, MD   40 mg at 04/06/15 0912  . sodium chloride flush (NS) 0.9 % injection 3 mL  3 mL Intravenous Q12H Ihor Austin, MD   3 mL at 04/06/15 0923  . thiamine (VITAMIN B-1) tablet 100 mg  100 mg  Oral Daily Ihor Austin, MD   100 mg at 04/06/15 0912  . TRIPLE ANTIBIOTIC [phone] OINT   Topical PRN Altamese Dilling, MD   1 application at 04/06/15 0617  . vitamin B-12 (CYANOCOBALAMIN) tablet 1,000 mcg  1,000 mcg Oral Daily Ihor Austin, MD   1,000 mcg at 04/06/15 0912    Musculoskeletal: Strength & Muscle Tone: decreased Gait & Station: unable to stand Patient leans: N/A  Psychiatric Specialty Exam: Review of Systems  Constitutional: Negative.  HENT: Negative.   Eyes: Negative.   Respiratory: Negative.   Cardiovascular: Negative.   Gastrointestinal: Negative.  Negative for nausea and abdominal pain.  Musculoskeletal: Negative.   Skin: Negative.   Neurological: Negative.  Negative for dizziness and tremors.  Psychiatric/Behavioral: Positive for memory loss. Negative for depression, suicidal ideas, hallucinations and substance abuse. The patient is not nervous/anxious and does not have insomnia.     Blood pressure 151/100, pulse 75, temperature 99.7 F (37.6 C), temperature source Axillary, resp. rate 23, height  (1.676 m), weight 70 kg (154 lb 5.2 oz), SpO2 97 %.Body mass index is 24.92 kg/(m^2).  General Appearance: Casual  Eye Contact::  Fair  Speech:  Slow and Slurred  Volume:  Decreased  Mood:  Euthymic  Affect:  Constricted  Thought Process:  Goal Directed  Orientation:  Full (Time, Place, and Person)  Thought Content:  Negative  Suicidal Thoughts:  No  Homicidal Thoughts:  No  Memory:  Immediate;   Fair Recent;   Fair Remote;   Fair  Judgement:  Fair  Insight:  Fair  Psychomotor Activity:  Decreased  Concentration:  Fair  Recall:  [employer] of Knowledge:Fair  Language: Fair  Akathisia:  No  Handed:  Right  AIMS (if indicated):     Assets:  Communication Skills Desire for Improvement Financial Resources/Insurance Housing Social Support  ADL's:  Impaired  Cognition: Impaired,  Mild  Sleep:      Treatment Plan Summary: Plan Case  reviewed with nursing in the critical care unit and with the patient. As far as his DTs I think that if he remains lucid and calm for at least another few hours into the evening it would probably be safe to try transferring him to the medical floor although there is always some risk of a return of symptoms. Right now however he is more lucid and calm than I have seen him throughout his hospitalization. As far as his mood problems he gives a history that could be consistent with bipolar disorder. I'm not going to start any medication yet at this time. Started to discuss that with the patient. We will follow-up as needed.  Disposition: Patient does not meet criteria for psychiatric inpatient admission. Supportive therapy provided about ongoing stressors.  Mordecai Rasmussen, MD 04/06/2015 4:34 PM

## 2015-04-06 NOTE — Progress Notes (Signed)
Mr. Jerry Dunn was weaned off precedex drip around 1330. Is more alert and follows commands. Pt is calm and cooperative. No ativan needed at this time. Orders placed to be transferred to the floor. Family aware. Take meds whole without difficulty, with adequate an appetite. Sitter at bedside. He remains on seizure precautions. Side rails padded, bed in lowest position. Call bell within reach. VSS

## 2015-04-06 NOTE — Plan of Care (Signed)
Problem: Nutrition: Goal: Adequate nutrition will be maintained Outcome: Completed/Met Date Met:  04/06/15 Patient 100% of meals

## 2015-04-06 NOTE — Care Management (Signed)
Transferred to icu due to need for precedex drip for etoh withdrawal sx

## 2015-04-06 NOTE — Progress Notes (Signed)
Axillary temp of 97.4 has not improved after adding additional blankets to patient. LawyerBair hugger initiated.

## 2015-04-06 NOTE — Progress Notes (Signed)
Bladder scan reveals >450 in bladder. Notified Dr. Sheryle Hailiamond, verbal order with read back for urethral catheter obtained.

## 2015-04-06 NOTE — Progress Notes (Signed)
[facility] [place] [facility]   PATIENT NAME: Jerry ElseWilliam Dunn    MR#:  [id number]  DATE OF BIRTH:  02-Dec-1980  SUBJECTIVE:  CHIEF COMPLAINT:   Chief Complaint  Patient presents with  . Seizures     After having severe agitation episode 04/03/15 morning when he was started on increased Ativan doses, was very agitated on 04/05/15 morning-  Disoriented, and trying to come off the bed also- so transferred to ICU with precidex drip, stable and calm now with precidex.   REVIEW OF SYSTEMS:   Can not give ROS as he was having DT,now somewhat calm..     ROS  DRUG ALLERGIES:   Allergies  Allergen Reactions  . Bee Venom Swelling    VITALS:  Blood pressure 146/92, pulse 81, temperature 99.1 F (37.3 C), temperature source Oral, resp. rate 16, height 5\' 6"  (1.676 m), weight 70 kg (154 lb 5.2 oz), SpO2 98 %.  PHYSICAL EXAMINATION:  GENERAL:  35 y.o.-year-old patient lying in the bed with no agitation.  EYES: Pupils equal, round, reactive to light. No scleral icterus. Extraocular muscles intact.  HEENT: Head has trauma marks on nose and forehead, normocephalic. Oropharynx and nasopharynx clear.  NECK:  Supple, no jugular venous distention. No thyroid enlargement, no tenderness.  LUNGS: Normal breath sounds bilaterally, no wheezing, rales,rhonchi or crepitation. No use of accessory muscles of respiration.  CARDIOVASCULAR: S1, S2 normal,tachycardia. No murmurs, rubs, or gallops.  ABDOMEN: Soft, nontender, nondistended. Bowel sounds present. No organomegaly or mass.  EXTREMITIES: No pedal edema, cyanosis, or clubbing.  NEUROLOGIC: Cranial nerves could not be tested. Muscle strength 5/5 in all extremities. Sensation and Gait not checked.no tremors present. PSYCHIATRIC: The patient is alert and oriented.  SKIN: No obvious rash, lesion, or ulcer.   Physical Exam LABORATORY PANEL:   CBC  Recent Labs Lab 04/04/15 0551  WBC 5.6  HGB 12.6*  HCT 36.7*  PLT  60*   ------------------------------------------------------------------------------------------------------------------  Chemistries   Recent Labs Lab 04/02/15 0630  NA 136  K 3.5  CL 99*  CO2 23  GLUCOSE 102*  BUN 6  CREATININE 0.64  CALCIUM 8.8*  MG 1.6*  AST 310*  ALT 142*  ALKPHOS 90  BILITOT 1.8*   ------------------------------------------------------------------------------------------------------------------  Cardiac Enzymes No results for input(s): TROPONINI in the last 168 hours. ------------------------------------------------------------------------------------------------------------------  RADIOLOGY:  No results found.  ASSESSMENT AND PLAN:   Principal Problem:   Delirium tremens (HCC) Active Problems:   Alcohol withdrawal (HCC)   Alcohol abuse   Seizure (HCC)   Chronic pancreatitis (HCC)  *  Alcohol withdrawal seizure- presented with that- no seizures any more. *  Alcohol withdrawal- delirium tremens     Give IV fluids, Ativan as needed, no more seizures.     Thiamin and folic acid.     Appreciated psychiatric consult,     He has severe delirium episodes almost daily now for last 3 days     Spoke to psych- suggested to increase ativan to Q 1 hourly.     But later transfer to stepdown and precedex drip for better control.     Appears under control,taper precidex.  *  acute on chronic pancreatitis     Not much pain now-  Tolerating regular food now. *  Dehydration- iv fluids. Decrease fluids now. *  Abnormal liver function tests      Due to chronic alcoholism. *  Thrombocytopenia      Due to alcoholism, monitor.  Continue to drop, stop Lovenox.     Plt low but stable now.  All the records are reviewed and case discussed with Care Management/Social Workerr. Management plans discussed with the patient, family and they are in agreement.  CODE STATUS: full  TOTAL TIME TAKING CARE OF THIS PATIENT: 45 critical care minutes.  Critical due  to presence of DT's, transfer to stepdown. Discussed with his mother and sister in the room.  POSSIBLE D/C IN 2 DAYS, DEPENDING ON CLINICAL CONDITION.   Jerry Dunn M.D on 04/06/2015   Between 7am to 6pm - Pager - [phone]  After 6pm go to [email or web address] - password [email or web address] [facility]  [place]  Hospitalists  Office  [phone]  CC: Primary care physician; No primary care provider on file.  Note: This dictation was prepared with Dragon dictation along with smaller phrase technology. Any transcriptional errors that result from this process are unintentional.

## 2015-04-06 NOTE — Progress Notes (Signed)
[facility] - [place] at [facility]   PATIENT NAME: Jerry Dunn    MR#:  [id number]  DATE OF BIRTH:  Mar 21, 1980  SUBJECTIVE:  CHIEF COMPLAINT:   Chief Complaint  Patient presents with  . Seizures     After having severe agitation episode 04/03/15 morning when he was started on increased Ativan doses, he received those doses in day time but from 9 pm 04/04/15 till 9 am 04/05/15- he did nto receive any doses, and so appears some anxious this morning.   Disoriented, and trying to come off the bed also, given 2 doses of ativan since 9 in morning.   He also called 911 from room.   REVIEW OF SYSTEMS:   Can not give ROS as he is having DT's.     ROS  DRUG ALLERGIES:   Allergies  Allergen Reactions  . Bee Venom Swelling    VITALS:  Blood pressure 109/67, pulse 85, temperature 99.8 F (37.7 C), temperature source Axillary, resp. rate 20, height  (1.676 m), weight 70 kg (154 lb 5.2 oz), SpO2 95 %.  PHYSICAL EXAMINATION:  GENERAL:  35 y.o.-year-old patient lying in the bed with acute agitation.  EYES: Pupils equal, round, reactive to light. No scleral icterus. Extraocular muscles intact.  HEENT: Head has trauma marks on nose and forehead, normocephalic. Oropharynx and nasopharynx clear.  NECK:  Supple, no jugular venous distention. No thyroid enlargement, no tenderness.  LUNGS: Normal breath sounds bilaterally, no wheezing, rales,rhonchi or crepitation. No use of accessory muscles of respiration.  CARDIOVASCULAR: S1, S2 normal,tachycardia. No murmurs, rubs, or gallops.  ABDOMEN: Soft, nontender, nondistended. Bowel sounds present. No organomegaly or mass.  EXTREMITIES: No pedal edema, cyanosis, or clubbing.  NEUROLOGIC: Cranial nerves could not be tested. Muscle strength 5/5 in all extremities. Sensation and Gait not checked. tremors present. PSYCHIATRIC: The patient is alert and agitated with complete disorientation.  SKIN: No obvious rash, lesion,  or ulcer.   Physical Exam LABORATORY PANEL:   CBC  Recent Labs Lab 04/04/15 0551  WBC 5.6  HGB 12.6*  HCT 36.7*  PLT 60*   ------------------------------------------------------------------------------------------------------------------  Chemistries   Recent Labs Lab 04/02/15 0630  NA 136  K 3.5  CL 99*  CO2 23  GLUCOSE 102*  BUN 6  CREATININE 0.64  CALCIUM 8.8*  MG 1.6*  AST 310*  ALT 142*  ALKPHOS 90  BILITOT 1.8*   ------------------------------------------------------------------------------------------------------------------  Cardiac Enzymes No results for input(s): TROPONINI in the last 168 hours. ------------------------------------------------------------------------------------------------------------------  RADIOLOGY:  No results found.  ASSESSMENT AND PLAN:   Principal Problem:   Delirium tremens (HCC) Active Problems:   Alcohol withdrawal (HCC)   Alcohol abuse   Seizure (HCC)   Chronic pancreatitis (HCC)  *  Alcohol withdrawal seizure- presented with that- no seizures any more. *  Alcohol withdrawal- delirium tremens     Give IV fluids, Ativan as needed, no more seizures.     Thiamin and folic acid.     Appreciated psychiatric consult, updated family members were present in the room.     He has severe delirium episodes almost daily now for last 3 days- last night he did not receive Ativan till 9 am. And very   agitated now.     Spoke to psych- suggested to increase ativan to Q 1 hourly.     But now transfer to stepdown and precedex drip for better control.  *  acute on chronic pancreatitis     Not much  pain now- asking to try food.     Give soft diet now. Tolerating regular food now. *  Dehydration- iv fluids. *  Abnormal liver function tests      Due to chronic alcoholism. *  Thrombocytopenia      Due to alcoholism, monitor.     Continue to drop, stop Lovenox.   Plt stable now.   All the records are reviewed and case discussed  with Care Management/Social Workerr. Management plans discussed with the patient, family and they are in agreement.  CODE STATUS: full  TOTAL TIME TAKING CARE OF THIS PATIENT: 45 critical care minutes.  Critical due to presence of DT's, transfer to stepdown. Discussed with his mother and sister in the room.  POSSIBLE D/C IN 2 DAYS, DEPENDING ON CLINICAL CONDITION.   Altamese DillingVACHHANI, [place] Kappes M.D on 04/06/2015   Between 7am to 6pm - Pager - [phone]  After 6pm go to [email or web address] - password [email or web address]  [facility] [place] [facility]  Office  [phone]  CC: Primary care physician; No primary care provider on file.  Note: This dictation was prepared with Dragon dictation along with smaller phrase technology. Any transcriptional errors that result from this process are unintentional.

## 2015-04-07 LAB — BASIC METABOLIC PANEL
ANION GAP: 11 (ref 5–15)
CO2: 23 mmol/L (ref 22–32)
Calcium: 8.8 mg/dL — ABNORMAL LOW (ref 8.9–10.3)
Chloride: 102 mmol/L (ref 101–111)
Creatinine, Ser: 0.56 mg/dL — ABNORMAL LOW (ref 0.61–1.24)
GFR calc Af Amer: >60 mL/min (ref >60)
GLUCOSE: 123 mg/dL — AB (ref 65–99)
POTASSIUM: 2.5 mmol/L — AB (ref 3.5–5.1)
Sodium: 136 mmol/L (ref 135–145)

## 2015-04-07 LAB — MAGNESIUM
Magnesium: 1 mg/dL — ABNORMAL LOW (ref 1.7–2.4)
Magnesium: 2.2 mg/dL (ref 1.7–2.4)

## 2015-04-07 LAB — PHOSPHORUS: Phosphorus: 3.4 mg/dL (ref 2.5–4.6)

## 2015-04-07 LAB — GLUCOSE, CAPILLARY: GLUCOSE-CAPILLARY: 86 mg/dL (ref 65–99)

## 2015-04-07 LAB — POTASSIUM: POTASSIUM: 3.3 mmol/L — AB (ref 3.5–5.1)

## 2015-04-07 MED ORDER — CITALOPRAM HYDROBROMIDE 20 MG PO TABS
10.0000 mg | ORAL_TABLET | Freq: Every day | ORAL | Status: DC
  Administered 2015-04-07 – 2015-04-08 (×2): 10 mg via ORAL
  Filled 2015-04-07 (×2): qty 1

## 2015-04-07 MED ORDER — MAGNESIUM SULFATE 4 GM/100ML IV SOLN
4.0000 g | Freq: Once | INTRAVENOUS | Status: DC
Start: 2015-04-07 — End: 2015-04-08
  Filled 2015-04-07 (×2): qty 100

## 2015-04-07 MED ORDER — PHENOL 1.4 % MT LIQD: 1.0000 | OROMUCOSAL | Status: DC | PRN

## 2015-04-07 MED ORDER — CEPASTAT 14.5 MG MT LOZG
1.0000 | LOZENGE | OROMUCOSAL | Status: DC | PRN
  Filled 2015-04-07: qty 9

## 2015-04-07 MED ORDER — POTASSIUM CHLORIDE CRYS ER 20 MEQ PO TBCR
20.0000 meq | EXTENDED_RELEASE_TABLET | Freq: Once | ORAL | Status: AC
  Administered 2015-04-07: 20 meq via ORAL
  Filled 2015-04-07: qty 1

## 2015-04-07 MED ORDER — NALTREXONE HCL 50 MG PO TABS
25.0000 mg | ORAL_TABLET | Freq: Every day | ORAL | Status: DC
  Administered 2015-04-07 – 2015-04-08 (×2): 25 mg via ORAL
  Filled 2015-04-07 (×2): qty 1

## 2015-04-07 MED ORDER — POTASSIUM CHLORIDE 20 MEQ PO PACK
40.0000 meq | PACK | Freq: Once | ORAL | Status: AC
  Administered 2015-04-07: 40 meq via ORAL
  Filled 2015-04-07: qty 2

## 2015-04-07 MED ORDER — POTASSIUM CHLORIDE 10 MEQ/100ML IV SOLN
10.0000 meq | INTRAVENOUS | Status: AC
  Administered 2015-04-07 (×4): 10 meq via INTRAVENOUS
  Filled 2015-04-07 (×4): qty 100

## 2015-04-07 NOTE — Clinical Social Work Note (Signed)
Clinical Social Work Assessment  Patient Details  Name: Jerry Dunn MRN: [id number] Date of Birth: 01-11-1981  Date of referral:  04/07/15               Reason for consult:  Substance Use/ETOH Abuse                Permission sought to share information with:    Permission granted to share information::     Name::        Agency::     Relationship::     Contact Information:     Housing/Transportation Living arrangements for the past 2 months:  Single Family Home Source of Information:  Patient Patient Interpreter Needed:  None Criminal Activity/Legal Involvement Pertinent to Current Situation/Hospitalization:  Yes (Patient has a DUI that he is court mandated to seek outpatient counseling with [facility]) Significant Relationships:  Parents (Lives with mother) Lives with:  Parents Do you feel safe going back to the place where you live?  Yes Need for family participation in patient care:  Yes (Comment)  Care giving concerns:  Patient resides with his mother.   Social Worker assessment / plan:  CSW received consult for ETOH abuse and CSW met with patient this morning in ICU and introduced self and role of CSW. Patient has a long history of drinking and stated that at the end of last year, he received a DUI and that he was court mandated to receive counseling. Patient states that when he is discharged from the hospital, that he will be making an outpatient appointment with [facility]. Patient initially stated to CSW that he was not interested in any outpatient resource information for ETOH abuse but when asked further, he mentioned the above about court mandated counseling. Patient not that interested in providing detailed information and thus SBIRT was not completed.   Employment status:  Disabled (Comment on whether or not currently receiving Disability) Insurance information:  Medicaid [place] PT Recommendations:  Not assessed at this time Information / Referral to community  resources:     Patient/Family's Response to care:  Patient expressed appreciation for CSW visit.   Patient/Family's Understanding of and Emotional Response to Diagnosis, Current Treatment, and Prognosis:  Patient appears to be interested in outpatient rehab/counseling mainly due to the court mandate. CSW unsure about patient's true desire to quit drinking.  Emotional Assessment Appearance:  Appears older than stated age Attitude/Demeanor/Rapport:   (pleasant and cooperative) Affect (typically observed):  Accepting, Adaptable, Calm Orientation:  Oriented to Self, Oriented to Place, Oriented to  Time, Oriented to Situation Alcohol / Substance use:  Alcohol Use Psych involvement (Current and /or in the community):  Yes (Comment)  Discharge Needs  Concerns to be addressed:  Care Coordination Readmission within the last 30 days:  No Current discharge risk:  Substance Abuse Barriers to Discharge:  No Barriers Identified   Shela Leff, LCSW 04/07/2015, 12:10 PM

## 2015-04-07 NOTE — Progress Notes (Signed)
[facility] [place] [facility]   PATIENT NAME: Jerry ElseWilliam Dunn    MR#:  [id number]  DATE OF BIRTH:  1980/06/18  SUBJECTIVE:  CHIEF COMPLAINT:   Chief Complaint  Patient presents with  . Seizures     After having severe agitation episode 04/03/15 morning when he was started on increased Ativan doses, was very agitated on 04/05/15 morning-  Disoriented, and trying to come off the bed also- so transferred to ICU with precidex drip, stable and calm now with precidex.  -Today patient is doing much better. Off Precedex drip. Feeling better   REVIEW OF SYSTEMS:       Review of Systems  Constitutional: Negative for fever, chills and weight loss.  HENT: Negative for hearing loss and tinnitus.   Eyes: Negative for blurred vision, double vision and discharge.  Respiratory: Negative for cough and sputum production.   Cardiovascular: Negative for chest pain and palpitations.  Gastrointestinal: Negative for nausea, vomiting and abdominal pain.  Genitourinary: Negative for dysuria, urgency and frequency.  Musculoskeletal: Negative for back pain, joint pain and neck pain.  Skin: Negative for itching and rash.  Neurological: Negative for tingling, sensory change, speech change and headaches.  Psychiatric/Behavioral: Negative for hallucinations and substance abuse. The patient is not nervous/anxious.     DRUG ALLERGIES:   Allergies  Allergen Reactions  . Bee Venom Swelling    VITALS:  Blood pressure 111/75, pulse 97, temperature 98.3 F (36.8 C), temperature source Oral, resp. rate 18, height 5\' 6"  (1.676 m), weight 70 kg (154 lb 5.2 oz), SpO2 96 %.  PHYSICAL EXAMINATION:  GENERAL:  35 y.o.-year-old patient lying in the bed with no agitation.  EYES: Pupils equal, round, reactive to light. No scleral icterus. Extraocular muscles intact.  HEENT: Head has trauma marks on nose and forehead, normocephalic. Oropharynx and nasopharynx clear. Bruises on the nose  are healing well NECK:  Supple, no jugular venous distention. No thyroid enlargement, no tenderness.  LUNGS: Normal breath sounds bilaterally, no wheezing, rales,rhonchi or crepitation. No use of accessory muscles of respiration.  CARDIOVASCULAR: S1, S2 normal,tachycardia. No murmurs, rubs, or gallops.  ABDOMEN: Soft, nontender, nondistended. Bowel sounds present. No organomegaly or mass.  EXTREMITIES: No pedal edema, cyanosis, or clubbing.  NEUROLOGIC: Cranial nerves could not be tested. Muscle strength 5/5 in all extremities. Sensation and Gait not checked.no tremors present. PSYCHIATRIC: The patient is alert and oriented.  SKIN: No obvious rash, lesion, or ulcer.   Physical Exam LABORATORY PANEL:   CBC  Recent Labs Lab 04/04/15 0551  WBC 5.6  HGB 12.6*  HCT 36.7*  PLT 60*   ------------------------------------------------------------------------------------------------------------------  Chemistries   Recent Labs Lab 04/02/15 0630 04/07/15 1120  NA 136 136  K 3.5 2.5*  CL 99* 102  CO2 23 23  GLUCOSE 102* 123*  BUN 6 <5*  CREATININE 0.64 0.56*  CALCIUM 8.8* 8.8*  MG 1.6* 1.0*  AST 310*  --   ALT 142*  --   ALKPHOS 90  --   BILITOT 1.8*  --    ------------------------------------------------------------------------------------------------------------------  Cardiac Enzymes No results for input(s): TROPONINI in the last 168 hours. ------------------------------------------------------------------------------------------------------------------  RADIOLOGY:  No results found.  ASSESSMENT AND PLAN:   Principal Problem:   Delirium tremens (HCC) Active Problems:   Alcohol withdrawal (HCC)   Alcohol abuse   Seizure (HCC)   Chronic pancreatitis (HCC)  *  Alcohol withdrawal seizure- presented with that- no seizures any more.  *  Alcohol withdrawal- delirium tremens  Clinically improving     Thiamin, multivitamin and folic acid.     Appreciated  psychiatric consult, started patient on Celexa and recommend outpatient psych follow-up     Off precedex drip       *  acute on chronic pancreatitis     Not much pain now-  Tolerating regular food now. *Severe hypokalemia and hypomagnesemia secondary to alcohol abuse Replete potassium and mag and recheck in a.m.  *  Dehydration- improved with IV fluids  *  Abnormal liver function tests      Due to chronic alcoholism.  *  Thrombocytopenia      Due to alcoholism, monitor.     Continue to drop, stop Lovenox.      *Deconditioning with physical therapy  All the records are reviewed and case discussed with Care Management/Social Workerr. Management plans discussed with the patient, family and they are in agreement.  CODE STATUS: full  TOTAL TIME TAKING CARE OF THIS PATIENT: 35 minutes  Discussed with patient and father at bedside. They're aware of the plan   POSSIBLE D/C IN 2 DAYS, DEPENDING ON CLINICAL CONDITION.   Ramonita Lab M.D on 04/07/2015   Between 7am to 6pm - Pager - [phone]   After 6pm go to [email or web address] - password [email or web address] [facility]  [facility]  Office  [phone]  CC: Primary care physician; No primary care provider on file.  Note: This dictation was prepared with Dragon dictation along with smaller phrase technology. Any transcriptional errors that result from this process are unintentional.

## 2015-04-07 NOTE — Care Management (Signed)
Patient is for transfer out of icu today.  Off Precedex drip since 3/12 and only scoring 0-2 on ciwa

## 2015-04-07 NOTE — Consult Note (Signed)
[facility] Face-to-Face Psychiatry Consult   Reason for Consult:  Follow-up consult 35 year old man with alcohol abuse going through delirium tremens also history of chronic mood disorder. Referring Physician:  Madelon Lips Patient Identification: Telvin Reinders MRN:  [id number] Principal Diagnosis: Delirium tremens [facility]) Diagnosis:   Patient Active Problem List   Diagnosis Date Noted  . Delirium tremens (HCC) [F10.231] 04/03/2015  . Chronic pancreatitis (HCC) [K86.1] 04/03/2015  . Seizure (HCC) [R56.9] 04/02/2015  . 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 Rectal bleeding [K62.5] 07/31/2014  . Alcohol withdrawal (HCC) [F10.239] 07/31/2014  . Hyponatremia [E87.1] 07/31/2014  . Encephalopathy acute [G93.40] 07/31/2014  . Alcohol abuse [F10.10]     Total Time spent with patient: 35 minutes  Subjective:   Raghav Verrilli is a 35 y.o. male patient admitted with patient states he is feeling better today.Marland Kitchen  HPI:  Follow-up today Monday the 13th. Patient is still in the critical care unit but he has remained off of his Precedex drip. Currently his blood pressure was normal pulse only slightly elevated. Patient was asleep when I came in but woke up easily. No tremor. He was alert and oriented. Did not endorse any depression no suicidal ideation denied any hallucinations. Appears to be feeling stable. Said he did not sleep well last night. Not having nightmares though. No pain problems no nausea. Past Psychiatric History: As noted above he is only had one trial of medication with lamotrigine and Seroquel. He didn't find it made a difference to his mood. He has only ever been able stop drinking for a few weeks at a time.  Risk to Self: Is patient at risk for suicide?: No Risk to Others:   Prior Inpatient Therapy:   Prior Outpatient Therapy:    Past Medical History:  Past Medical History  Diagnosis Date  . Pancreatitis   . Alcohol abuse     Past Surgical History  Procedure Laterality Date   . None     Family History:  Family History  Problem Relation Age of Onset  . Diabetes Mother   . Hypertension Father    Family Psychiatric  History: No family history reported of mood or substance abuse problems Social History:  History  Alcohol Use  . 0.0 oz/week  . 0 Standard drinks or equivalent per week    Comment: 10 beers daily     History  Drug Use No    Social History   Social History  . Marital Status: Single    Spouse Name: N/A  . Number of Children: N/A  . Years of Education: N/A   Occupational History  . unemployed    Social History Main Topics  . Smoking status: Current Every Day Smoker -- 1.00 packs/day for 16 years    Types: Cigarettes  . Smokeless tobacco: None  . Alcohol Use: 0.0 oz/week    0 Standard drinks or equivalent per week     Comment: 10 beers daily  . Drug Use: No  . Sexual Activity: Not Asked   Other Topics Concern  . None   Social History Narrative   Lives with parents at home   Additional Social History:    Allergies:   Allergies  Allergen Reactions  . Bee Venom Swelling    Labs:  Results for orders placed or performed during the hospital encounter of 04/01/15 (from the past 48 hour(s))  MRSA PCR Screening     Status: None   Collection Time: 04/05/15  2:04 PM  Result Value Ref Range  MRSA by PCR NEGATIVE NEGATIVE    Comment:        The GeneXpert MRSA Assay (FDA approved for NASAL specimens only), is one component of a comprehensive MRSA colonization surveillance program. It is not intended to diagnose MRSA infection nor to guide or monitor treatment for MRSA infections.   Glucose, capillary     Status: None   Collection Time: 04/07/15  8:11 AM  Result Value Ref Range   Glucose-Capillary 86 65 - 99 mg/dL    Current Facility-Administered Medications  Medication Dose Route Frequency Provider Last Rate Last Dose  . 0.9 %  sodium chloride infusion   Intravenous Continuous Ihor Austin, MD 125 mL/hr at  04/07/15 0208    . acetaminophen (TYLENOL) tablet 650 mg  650 mg Oral Q4H PRN Rebecka Apley, MD      . alum & mag hydroxide-simeth (MAALOX/MYLANTA) 200-200-20 MG/5ML suspension 30 mL  30 mL Oral PRN Rebecka Apley, MD      . cholecalciferol (VITAMIN D) tablet 1,000 Units  1,000 Units Oral Daily Ihor Austin, MD   1,000 Units at 04/07/15 0942  . cloNIDine (CATAPRES) tablet 0.1 mg  0.1 mg Oral BID Ihor Austin, MD   0.1 mg at 04/07/15 0942  . cycloSPORINE (RESTASIS) 0.05 % ophthalmic emulsion 1 drop  1 drop Both Eyes BID Katha Hamming, MD   1 drop at 04/07/15 0942  . folic acid (FOLVITE) tablet 1 mg  1 mg Oral Daily Ihor Austin, MD   1 mg at 04/07/15 0942  . hydrALAZINE (APRESOLINE) injection 10 mg  10 mg Intravenous Q6H PRN Altamese Dilling, MD   10 mg at 04/07/15 0710  . ibuprofen (ADVIL,MOTRIN) tablet 600 mg  600 mg Oral Q8H PRN Rebecka Apley, MD   600 mg at 04/07/15 0839  . LORazepam (ATIVAN) injection 2 mg  2 mg Intravenous Q1H PRN Audery Amel, MD   2 mg at 04/05/15 1417  . nicotine (NICODERM CQ - dosed in mg/24 hours) patch 21 mg  21 mg Transdermal Daily Katha Hamming, MD   21 mg at 04/07/15 0942  . ondansetron (ZOFRAN) tablet 4 mg  4 mg Oral Q6H PRN Ihor Austin, MD       Or  . ondansetron (ZOFRAN) injection 4 mg  4 mg Intravenous Q6H PRN Pavan Pyreddy, MD      . pantoprazole (PROTONIX) EC tablet 40 mg  40 mg Oral Daily Ihor Austin, MD   40 mg at 04/07/15 0942  . phenol (CHLORASEPTIC) mouth spray 1 spray  1 spray Mouth/Throat PRN Oralia Manis, MD      . phenol-menthol (CEPASTAT) lozenge 1 lozenge  1 lozenge Buccal PRN Oralia Manis, MD      . sodium chloride flush (NS) 0.9 % injection 3 mL  3 mL Intravenous Q12H Ihor Austin, MD   3 mL at 04/07/15 0943  . thiamine (VITAMIN B-1) tablet 100 mg  100 mg Oral Daily Ihor Austin, MD   100 mg at 04/07/15 0942  . TRIPLE ANTIBIOTIC [phone] OINT   Topical PRN Altamese Dilling, MD   1 application at  04/06/15 0617  . vitamin B-12 (CYANOCOBALAMIN) tablet 1,000 mcg  1,000 mcg Oral Daily Ihor Austin, MD   1,000 mcg at 04/07/15 1610    Musculoskeletal: Strength & Muscle Tone: decreased Gait & Station: unable to stand Patient leans: N/A  Psychiatric Specialty Exam: Review of Systems  Constitutional: Positive for malaise/fatigue.  HENT: Negative.   Eyes: Negative.   Respiratory:  Negative.   Cardiovascular: Negative.   Gastrointestinal: Negative.  Negative for nausea and abdominal pain.  Musculoskeletal: Negative.   Skin: Negative.   Neurological: Positive for weakness. Negative for dizziness and tremors.  Psychiatric/Behavioral: Positive for memory loss and substance abuse. Negative for depression, suicidal ideas and hallucinations. The patient has insomnia. The patient is not nervous/anxious.     Blood pressure 111/75, pulse 97, temperature 98.3 F (36.8 C), temperature source Oral, resp. rate 18, height 5\' 6"  (1.676 m), weight 70 kg (154 lb 5.2 oz), SpO2 96 %.Body mass index is 24.92 kg/(m^2).  General Appearance: Casual  Eye Contact::  Fair  Speech:  Slow and Slurred  Volume:  Decreased  Mood:  Euthymic  Affect:  Constricted  Thought Process:  Goal Directed  Orientation:  Full (Time, Place, and Person)  Thought Content:  Negative  Suicidal Thoughts:  No  Homicidal Thoughts:  No  Memory:  Immediate;   Fair Recent;   Fair Remote;   Fair  Judgement:  Fair  Insight:  Fair  Psychomotor Activity:  Decreased  Concentration:  Fair  Recall:  Fis[employer]Fair  Fund of Knowledge:Fair  Language: Fair  Akathisia:  No  Handed:  Right  AIMS (if indicated):     Assets:  Communication Skills Desire for Improvement Financial Resources/Insurance Housing Social Support  ADL's:  Impaired  Cognition: Impaired,  Mild  Sleep:      Treatment Plan Summary: Plan Patient has remained off of Precedex drip for about 24 hours at this point. Has not had any return of delirium. Vital signs remained  stable. No agitation. I would anticipate that he is likely to move out of the critical care unit and also may be stable enough for discharge within the next one day to 2 days unless some new medical problem arises. I discussed the treatment plans with the patient. We would refer him to outpatient substance abuse treatment in the community. I would also probably recommend that this would be a patient for whom [facility] would be a good treatment option if he could afford it. as far as his depression is concerned. We reviewed the possibility of bipolar depression versus unilateral depression and anxiety attacks. It sounds like the anxiety attacks have perhaps been the more profound issue. I proposed to him that rather than restarting a medicine specifically for bipolar disorder that we try starting a modest dose of citalopram for depression and anxiety. I emphasized to him that we will need to make sure he has outpatient psychiatric follow-up. He agrees to the plan. I will make these medication changes and continue to follow up daily. Thank you.  Disposition: Patient does not meet criteria for psychiatric inpatient admission. Supportive therapy provided about ongoing stressors.  Mordecai RasmussenJohn Clapacs, MD 04/07/2015 11:58 AM

## 2015-04-07 NOTE — Progress Notes (Signed)
Pharmacy Consult for Electrolyte Monitoring Indication: hypokalemia, hypomagnesemia   Allergies  Allergen Reactions  . Bee Venom Swelling    Patient Measurements: Height: 5\' 6"  (167.6 cm) Weight: 154 lb 5.2 oz (70 kg) IBW/kg (Calculated) : 63.8  Vital Signs: Temp: 98.3 F (36.8 C) (03/13 0800) Temp Source: Oral (03/13 0800) BP: 111/75 mmHg (03/13 1100) Pulse Rate: 97 (03/13 1100) Intake/Output from previous day: 03/12 0701 - 03/13 0700 In: 2880 [I.V.:2880] Out: 3550 [Urine:3550] Intake/Output from this shift: Total I/O In: 1025 [P.O.:400; I.V.:625] Out: 900 [Urine:900]  Labs: No results for input(s): WBC, HGB, HCT, PLT, APTT, INR in the last 72 hours.   Recent Labs  04/07/15 1120  NA 136  K 2.5*  CL 102  CO2 23  GLUCOSE 123*  BUN <5*  CREATININE 0.56*  CALCIUM 8.8*  MG 1.0*  PHOS 3.4   Estimated Creatinine Clearance: 116.3 mL/min (by C-G formula based on Cr of 0.56).    Recent Labs  04/07/15 0811  GLUCAP 86    Medical History: Past Medical History  Diagnosis Date  . Pancreatitis   . Alcohol abuse     Medications:  Scheduled:  . cholecalciferol  1,000 Units Oral Daily  . citalopram  10 mg Oral Daily  . cloNIDine  0.1 mg Oral BID  . cycloSPORINE  1 drop Both Eyes BID  . folic acid  1 mg Oral Daily  . magnesium sulfate 1 - 4 g bolus IVPB  4 g Intravenous Once  . naltrexone  25 mg Oral Daily  . nicotine  21 mg Transdermal Daily  . pantoprazole  40 mg Oral Daily  . potassium chloride  40 mEq Oral Once  . potassium chloride  10 mEq Intravenous Q1 Hr x 4  . sodium chloride flush  3 mL Intravenous Q12H  . thiamine  100 mg Oral Daily  . vitamin B-12  1,000 mcg Oral Daily   Infusions:  . sodium chloride 125 mL/hr at 04/07/15 0208    Assessment: Pharmacy consulted to assist in managing electrolytes in this 35 y/o M admitted with alcohol withdrawal.   K= 2.5, Mag= 1.0, Phos= 3.4  Plan:  Magnesium 4 g iv once ordered. Potassium chloride 40  meq po once and 10 meq iv x 4 ordered. Will recheck potassium and magnesium at 1800 and replace as necessary.   Luisa HartChristy, Mattheus Rauls D 04/07/2015,12:40 PM

## 2015-04-07 NOTE — Progress Notes (Signed)
Pharmacy Consult for Electrolyte Monitoring Indication: hypokalemia, hypomagnesemia   Allergies  Allergen Reactions  . Bee Venom Swelling    Patient Measurements: Height: 5\' 6"  (167.6 cm) Weight: 154 lb 5.2 oz (70 kg) IBW/kg (Calculated) : 63.8  Vital Signs: Temp: 98.4 F (36.9 C) (03/13 1436) Temp Source: Oral (03/13 1436) BP: 123/81 mmHg (03/13 1436) Pulse Rate: 99 (03/13 1436) Intake/Output from previous day: 03/12 0701 - 03/13 0700 In: 2880 [I.V.:2880] Out: 3550 [Urine:3550] Intake/Output from this shift: Total I/O In: 1675 [P.O.:800; I.V.:875] Out: 900 [Urine:900]  Labs: No results for input(s): WBC, HGB, HCT, PLT, APTT, INR in the last 72 hours.   Recent Labs  04/07/15 1120 04/07/15 1743  NA 136  --   K 2.5* 3.3*  CL 102  --   CO2 23  --   GLUCOSE 123*  --   BUN <5*  --   CREATININE 0.56*  --   CALCIUM 8.8*  --   MG 1.0* 2.2  PHOS 3.4  --    Estimated Creatinine Clearance: 116.3 mL/min (by C-G formula based on Cr of 0.56).    Recent Labs  04/07/15 0811  GLUCAP 86    Medical History: Past Medical History  Diagnosis Date  . Pancreatitis   . Alcohol abuse     Medications:  Scheduled:  . cholecalciferol  1,000 Units Oral Daily  . citalopram  10 mg Oral Daily  . cloNIDine  0.1 mg Oral BID  . cycloSPORINE  1 drop Both Eyes BID  . folic acid  1 mg Oral Daily  . magnesium sulfate 1 - 4 g bolus IVPB  4 g Intravenous Once  . naltrexone  25 mg Oral Daily  . nicotine  21 mg Transdermal Daily  . pantoprazole  40 mg Oral Daily  . sodium chloride flush  3 mL Intravenous Q12H  . thiamine  100 mg Oral Daily  . vitamin B-12  1,000 mcg Oral Daily   Infusions:  . sodium chloride 125 mL/hr at 04/07/15 0208    Assessment: Pharmacy consulted to assist in managing electrolytes in this 35 y/o M admitted with alcohol withdrawal.   K= 3.3, Mag= 2.2, Phos= 3.4  Plan:  Will give KCL po 20 MEQ once. Recheck in the AM   Andera Cranmer D Tina Gruner 04/07/2015,6:38  PM

## 2015-04-07 NOTE — Evaluation (Signed)
Physical Therapy Evaluation Patient Details Name: Jerry Dunn MRN: [id number] DOB: 01-03-1981 Today's Date: 04/07/2015   History of Present Illness  Patient is a 35 y/o male that presents after falling from his bed and having multiple seizure events while trying to detox from EtOH  Clinical Impression  Patient presents after experiencing seizure(s) while detoxing from alcohol. He is independent at baseline with no reports of falls or use of ADs. Patient is lucid and follows all commands appropriately in this session. Noted to have some lateral drifting with ambulation with head turns, otherwise no deficits identified throughout this session. Modified DGI score of 11/12 indicates he is not at high risk for falling. No further PT needs identified in the acute setting. If he continues to have balance deficits he can seek outpatient PT services.     Follow Up Recommendations No PT follow up (Outpatient PT services if he feels uncomfortable with balance)    Equipment Recommendations       Recommendations for Other Services       Precautions / Restrictions Precautions Precautions: None Restrictions Weight Bearing Restrictions: No      Mobility  Bed Mobility Overal bed mobility: Independent             General bed mobility comments: No deficits in bed mobility.   Transfers Overall transfer level: Independent               General transfer comment: No deficits noted in speed or balance with sit to stand transfer.   Ambulation/Gait Ambulation/Gait assistance: Independent Ambulation Distance (Feet): 250 Feet Assistive device: None Gait Pattern/deviations: WFL(Within Functional Limits)   Gait velocity interpretation: at or above normal speed for age/gender General Gait Details: Patient demonstrates lateral drifting with head turns during ambulation, otherwise no balance deficits identified.   Stairs            Wheelchair Mobility    Modified Rankin  (Stroke Patients Only)       Balance Overall balance assessment: Independent (Modified DGI of 11/12 )                                           Pertinent Vitals/Pain Pain Assessment:  (No reports or signs of pain throughout session.)    Home Living Family/patient expects to be discharged to:: Private residence Living Arrangements: Parent Available Help at Discharge: Family Type of Home: House                Prior Function Level of Independence: Independent         Comments: Patient has been independent with ambulation recently, no reports of falls.      Hand Dominance        Extremity/Trunk Assessment   Upper Extremity Assessment: Overall WFL for tasks assessed           Lower Extremity Assessment: Overall WFL for tasks assessed         Communication   Communication: No difficulties  Cognition Arousal/Alertness: Awake/alert Behavior During Therapy: WFL for tasks assessed/performed Overall Cognitive Status: Within Functional Limits for tasks assessed                      General Comments General comments (skin integrity, edema, etc.): Open cutes/sores on nose from fall.     Exercises Other Exercises Other Exercises: Educated patient on standing single leg balance activities (  hip abductions, SLS, standing hip extensions).       Assessment/Plan    PT Assessment Patent does not need any further PT services  PT Diagnosis Difficulty walking   PT Problem List    PT Treatment Interventions     PT Goals (Current goals can be found in the Care Plan section) Acute Rehab PT Goals Patient Stated Goal: To return home  PT Goal Formulation: With patient Time For Goal Achievement: 04/21/15 Potential to Achieve Goals: Good    Frequency     Barriers to discharge        Co-evaluation               End of Session Equipment Utilized During Treatment: Gait belt Activity Tolerance: Patient tolerated treatment  well Patient left: in bed;with call bell/phone within reach;with bed alarm set;with family/visitor present Nurse Communication: Mobility status         Time: 1726-1736 PT Time Calculation (min) (ACUTE ONLY): 10 min   Charges:   PT Evaluation $PT Eval Low Complexity: 1 Procedure     PT G Codes:       Kerin RansomPatrick A Taquan Bralley, PT, DPT    04/07/2015, 6:14 PM

## 2015-04-08 LAB — BASIC METABOLIC PANEL
Anion gap: 8 (ref 5–15)
BUN: <5 mg/dL — ABNORMAL LOW (ref 6–20)
CALCIUM: 8.6 mg/dL — AB (ref 8.9–10.3)
CO2: 24 mmol/L (ref 22–32)
Chloride: 105 mmol/L (ref 101–111)
Creatinine, Ser: 0.52 mg/dL — ABNORMAL LOW (ref 0.61–1.24)
Glucose, Bld: 96 mg/dL (ref 65–99)
Potassium: 3.2 mmol/L — ABNORMAL LOW (ref 3.5–5.1)
SODIUM: 137 mmol/L (ref 135–145)

## 2015-04-08 LAB — MAGNESIUM: MAGNESIUM: 1.8 mg/dL (ref 1.7–2.4)

## 2015-04-08 MED ORDER — THIAMINE HCL 100 MG PO TABS: 100.0000 mg | ORAL_TABLET | Freq: Every day | ORAL | Status: DC

## 2015-04-08 MED ORDER — FOLIC ACID 1 MG PO TABS: 1.0000 mg | ORAL_TABLET | Freq: Every day | ORAL | Status: DC

## 2015-04-08 MED ORDER — POTASSIUM CHLORIDE 20 MEQ PO PACK
40.0000 meq | PACK | Freq: Once | ORAL | Status: AC
  Administered 2015-04-08: 40 meq via ORAL
  Filled 2015-04-08: qty 2

## 2015-04-08 MED ORDER — NICOTINE 21 MG/24HR TD PT24: 21.0000 mg | MEDICATED_PATCH | Freq: Every day | TRANSDERMAL | Status: DC

## 2015-04-08 MED ORDER — CITALOPRAM HYDROBROMIDE 10 MG PO TABS: 10.0000 mg | ORAL_TABLET | Freq: Every day | ORAL | Status: DC

## 2015-04-08 MED ORDER — TRIPLE ANTIBIOTIC 3.5-400-5000 EX OINT: TOPICAL_OINTMENT | CUTANEOUS | Status: DC

## 2015-04-08 NOTE — Progress Notes (Signed)
Initial Nutrition Assessment   INTERVENTION:   Meals and Snacks: Cater to patient preferences Medical Food Supplement Therapy: will send Carnation Instant Breakfast BID for added nutrition   NUTRITION DIAGNOSIS:   Inadequate oral intake related to acute illness as evidenced by  (meal completion <50% on average since admission secondary to pt status).  GOAL:   Patient will meet greater than or equal to 90% of their needs  MONITOR:   PO intake, Supplement acceptance, Labs, Weight trends, I & O's  REASON FOR ASSESSMENT:   LOS    ASSESSMENT:    Pt admitted with seizures while detoxing from EtOH. Pt very agitated 3/9 with increase in ativan, placed in ICU on precedex drip; drip discontinued on 3/13 and pt out to floor per MD note.  Past Medical History  Diagnosis Date  . Pancreatitis   . Alcohol abuse      Diet Order:  DIET SOFT Room service appropriate?: Yes; Fluid consistency:: Thin    Current Nutrition: Pt reports eating very well this am french toast and eggs with oatmeal, 75% of meal recorded. Pt with poor po intake on admission, NPO/CL and then eating recorded 54% of meals on 3/11-3/13.    Food/Nutrition-Related History: Pt reports PTA appetite has been down some secondary to nausea but would still try to eat multiple times a day anyways. Pt reports being 'afraid' to eat at times. Pt reports eating a big breakfast usually but would eat less/snack throughout the day most days. RD notes pt admitted for detox from EtOH use, last drink 2 days PTA.   Scheduled Medications:  . cholecalciferol  1,000 Units Oral Daily  . citalopram  10 mg Oral Daily  . cloNIDine  0.1 mg Oral BID  . cycloSPORINE  1 drop Both Eyes BID  . folic acid  1 mg Oral Daily  . magnesium sulfate 1 - 4 g bolus IVPB  4 g Intravenous Once  . naltrexone  25 mg Oral Daily  . nicotine  21 mg Transdermal Daily  . pantoprazole  40 mg Oral Daily  . sodium chloride flush  3 mL Intravenous Q12H  . thiamine   100 mg Oral Daily  . vitamin B-12  1,000 mcg Oral Daily    Continuous Medications:  . sodium chloride 125 mL/hr at 04/08/15 0705     Electrolyte/Renal Profile and Glucose Profile:   Recent Labs Lab 04/02/15 0630 04/07/15 1120 04/07/15 1743 04/08/15 0457  NA 136 136  --  137  K 3.5 2.5* 3.3* 3.2*  CL 99* 102  --  105  CO2 23 23  --  24  BUN 6 <5*  --  <5*  CREATININE 0.64 0.56*  --  0.52*  CALCIUM 8.8* 8.8*  --  8.6*  MG 1.6* 1.0* 2.2 1.8  PHOS  --  3.4  --   --   GLUCOSE 102* 123*  --  96   Protein Profile:  Recent Labs Lab 04/01/15 2347 04/02/15 0630  ALBUMIN 5.1* 4.2    Gastrointestinal Profile: Last BM:  04/07/2015   Weight Change: Pt reports usual body weight of 150lbs and it has been stable   Skin:   noted abrasions to bridge of nose   Height:   Ht Readings from Last 1 Encounters:  04/05/15 5\' 6"  (1.676 m)    Weight:   Wt Readings from Last 1 Encounters:  04/05/15 154 lb 5.2 oz (70 kg)     BMI:  Body mass index is 24.92 kg/(m^2).  EDUCATION NEEDS:   No education needs identified at this time   [place], RD, LDN Pager [phone] Weekend/On-Call Pager [phone]

## 2015-04-08 NOTE — Discharge Instructions (Signed)
Activity as tolerated Diet regular-as per dietitian recommendations Follow-up with alcohol rehabilitation center within 3-4 days Follow-up with primary care physician or [facility] in a week Follow-up with outpatient psychiatry in a week

## 2015-04-08 NOTE — Care Management (Signed)
Admitted to [facility] with the diagnosis of Delirium Tremors. Lives with parents. Mother is Ander SladeJoy,  [phone]). No primary care physician. [place] in [place]. Never been to [facility]. Application to [facility] given. Will send [employer] [facility] referral information. Physical therapy recommending outpatient therapy. [facility] information given, No insurance. States he applied for Medicaid last July, but was denied. Works at [employer] x 2 years. States he has no driver's license. Takes no prescription medications. Fell last Thursday when rolled over in bed. Appetite is "so-so." Takes care of all basic activities of daily living himself. Mother or father will transport. Gwenette GreetBrenda S Huntleigh Doolen RN MSN CCM Care Management [phone]

## 2015-04-08 NOTE — Progress Notes (Signed)
Discharge instructions given and went over with patient at bedside. Prescriptions given. All questions answered. Patient discharged home - escorted by Clinical research associatewriter to visitors entrance. Bo McclintockBrewer,Dorethy Tomey S, RN

## 2015-04-08 NOTE — Discharge Summary (Signed)
[facility] [place] [facility]   PATIENT NAME: Jerry Dunn    MR#:  [id number]  DATE OF BIRTH:  1980/02/22  DATE OF ADMISSION:  04/01/2015 ADMITTING PHYSICIAN: Ihor Austin, MD  DATE OF DISCHARGE: 04/08/15  PRIMARY CARE PHYSICIAN: No primary care provider on file.    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 Pancreatitis   . Alcohol abuse     HOSPITAL COURSE:    Alcohol withdrawal seizure- presented with that- no seizures any more.  * Alcohol withdrawal- delirium tremens  Clinically improving  Thiamin, multivitamin and folic acid.  Appreciated psychiatric consult, started patient on Celexa and recommend outpatient psych follow-up  Off precedex drip    * acute on chronic pancreatitis  Not much pain now- Tolerating regular food now. Clinically improved  *Severe hypokalemia and hypomagnesemia secondary to alcohol abuse Repleted potassium and mag   * Dehydration- improved with IV fluids  * Abnormal liver function tests  Due to chronic alcoholism.  * Thrombocytopenia  Due to alcoholism, monitor.  No active bleeding   *Tobacco abuse disorder Consultation to quit smoking for 3-4 minutes. Continue nicotine patch    DISCHARGE CONDITIONS:   fair  CONSULTS OBTAINED:  Treatment Team:  Audery Amel, MD Ramonita Lab, MD   PROCEDURES  None   DRUG ALLERGIES:   Allergies  Allergen Reactions  . Bee Venom Swelling    DISCHARGE MEDICATIONS:   Current Discharge Medication List    START taking these medications   Details   citalopram (CELEXA) 10 MG tablet Take 1 tablet (10 mg total) by mouth daily. Qty: 30 tablet, Refills: 0    folic acid (FOLVITE) 1 MG tablet Take 1 tablet (1 mg total) by mouth daily.    Neomycin-Bacitracin-Polymyxin (TRIPLE ANTIBIOTIC) [phone] OINT APPLY to  affected area twice a day Refills: 0    nicotine (NICODERM CQ - DOSED IN MG/24 HOURS) 21 mg/24hr patch Place 1 patch (21 mg total) onto the skin daily. Qty: 28 patch, Refills: 0    thiamine 100 MG tablet Take 1 tablet (100 mg total) by mouth daily.      CONTINUE these medications which have NOT CHANGED   Details  cholecalciferol (VITAMIN D) 1000 units tablet Take 1,000 Units by mouth daily.    omeprazole (PRILOSEC) 40 MG capsule Take 40 mg by mouth daily.    vitamin B-12 (CYANOCOBALAMIN) 1000 MCG tablet Take 1,000 mcg by mouth daily.         DISCHARGE INSTRUCTIONS:   Activity as tolerated Diet regular-as per dietitian recommendations Follow-up with alcohol rehabilitation center within 3-4 days Follow-up with primary care physician or [facility] in a week Follow-up with outpatient psychiatry in a week   DIET:  Regular diet  DISCHARGE CONDITION:  Fair  ACTIVITY:  Activity as tolerated  OXYGEN:  Home Oxygen: No.   Oxygen Delivery: room air  DISCHARGE LOCATION:  home   If you experience worsening of your admission symptoms, develop shortness of breath, life threatening emergency, suicidal or homicidal thoughts you must seek medical attention immediately by calling 911 or calling your MD immediately  if symptoms less severe.  You Must read complete instructions/literature  along with all the possible adverse reactions/side effects for all the Medicines you take and that have been prescribed to you. Take any new Medicines after you have completely understood and accpet all the possible adverse reactions/side effects.   Please note  You were cared for by a hospitalist during your hospital stay. If you  have any questions about your discharge medications or the care you received while you were in the hospital after you are discharged, you can call the unit and asked to speak with the hospitalist on call if the hospitalist that took care of you is not available. Once you are discharged, your primary care physician will handle any further medical issues. Please note that NO REFILLS for any discharge medications will be authorized once you are discharged, as it is imperative that you return to your primary care physician (or establish a relationship with a primary care physician if you do not have one) for your aftercare needs so that they can reassess your need for medications and monitor your lab values.     Today  Chief Complaint  Patient presents with  . Seizures   Patient is resting comfortably no other episodes of seizures. Doing fine and wants to go home. No tremors. Wants to follow alcohol rehabilitation center after discharge and planning to quit smoking and drinking  ROS:  CONSTITUTIONAL: Denies fevers, chills. Denies any fatigue, weakness.  EYES: Denies blurry vision, double vision, eye pain. EARS, NOSE, THROAT: Denies tinnitus, ear pain, hearing loss. RESPIRATORY: Denies cough, wheeze, shortness of breath.  CARDIOVASCULAR: Denies chest pain, palpitations, edema.  GASTROINTESTINAL: Denies nausea, vomiting, diarrhea, abdominal pain. Denies bright red blood per rectum. GENITOURINARY: Denies dysuria, hematuria. ENDOCRINE: Denies nocturia or thyroid problems. HEMATOLOGIC AND LYMPHATIC: Denies easy bruising or bleeding. SKIN: Denies rash or lesion. MUSCULOSKELETAL: Denies pain in neck, back, shoulder, knees, hips or arthritic symptoms.  NEUROLOGIC: Denies paralysis, paresthesias.  PSYCHIATRIC: Denies anxiety or depressive symptoms.   VITAL SIGNS:  Blood pressure 144/83, pulse 61, temperature 97.8 F (36.6 C), temperature source Oral, resp. rate 18, height 5\' 6"  (1.676 m), weight 70  kg (154 lb 5.2 oz), SpO2 99 %.  I/O:   Intake/Output Summary (Last 24 hours) at 04/08/15 1331 Last data filed at 04/08/15 1032  Gross per 24 hour  Intake    240 ml  Output   1050 ml  Net   -810 ml    PHYSICAL EXAMINATION:  GENERAL:  35 y.o.-year-old patient lying in the bed with no acute distress.  EYES: Pupils equal, round, reactive to light and accommodation. No scleral icterus. Extraocular muscles intact.  HEENT: Head atraumatic, normocephalic. Oropharynx and nasopharynx clear. Bruises on the nose are healing well NECK:  Supple, no jugular venous distention. No thyroid enlargement, no tenderness.  LUNGS: Normal breath sounds bilaterally, no wheezing, rales,rhonchi or crepitation. No use of accessory muscles of respiration.  CARDIOVASCULAR: S1, S2 normal. No murmurs, rubs, or gallops.  ABDOMEN: Soft, non-tender, non-distended. Bowel sounds present. No organomegaly or mass.  EXTREMITIES: No pedal edema, cyanosis, or clubbing.  NEUROLOGIC: Cranial nerves II through XII are intact. Muscle strength 5/5 in all extremities. Sensation intact. Gait not checked.  PSYCHIATRIC: The patient is alert and oriented x 3.  SKIN: No obvious rash, lesion, or ulcer.   DATA REVIEW:   CBC  Recent Labs Lab 04/04/15 0551  WBC 5.6  HGB 12.6*  HCT 36.7*  PLT 60*    Chemistries   Recent Labs Lab 04/02/15 0630  04/08/15 0457  NA 136  < > 137  K 3.5  < > 3.2*  CL 99*  < > 105  CO2 23  < > 24  GLUCOSE 102*  < > 96  BUN 6  < > <5*  CREATININE 0.64  < > 0.52*  CALCIUM 8.8*  < > 8.6*  MG 1.6*  < > 1.8  AST 310*  --   --   ALT 142*  --   --   ALKPHOS 90  --   --   BILITOT 1.8*  --   --   < > = values in this interval not displayed.  Cardiac Enzymes No results for input(s): TROPONINI in the last 168 hours.  Microbiology Results  Results for orders placed or performed during the hospital encounter of 04/01/15  MRSA PCR Screening     Status: None   Collection Time: 04/05/15  2:04 PM   Result Value Ref Range Status   MRSA by PCR NEGATIVE NEGATIVE Final    Comment:        The GeneXpert MRSA Assay (FDA approved for NASAL specimens only), is one component of a comprehensive MRSA colonization surveillance program. It is not intended to diagnose MRSA infection nor to guide or monitor treatment for MRSA infections.     RADIOLOGY:  No results found.  EKG:   Orders placed or performed during the hospital encounter of 04/01/15  . EKG 12-Lead  . EKG 12-Lead      Management plans discussed with the patient, family and they are in agreement.  CODE STATUS:     Code Status Orders        Start     Ordered   04/02/15 0417  Full code   Continuous     04/02/15 0417    Code Status History    Date Active Date Inactive Code Status Order ID Comments User Context   04/01/2015 11:35 PM 04/02/2015  4:17 AM Full Code [id number]  Rebecka Apley, MD ED   07/31/2014 10:49 AM 08/07/2014  3:19 PM Full Code [id number]  Ron Parker, MD Inpatient      TOTAL TIME TAKING CARE OF THIS PATIENT: 45  minutes.    @  on 04/08/2015 at 1:31 PM  Between 7am to 6pm - Pager - [phone]  After 6pm go to [email or web address] - password [email or web address] [facility]  [facility]  Office  [phone]  CC: Primary care physician; No primary care provider on file.

## 2015-05-08 ENCOUNTER — Telehealth: Payer: Self-pay

## 2015-05-08 NOTE — Telephone Encounter (Signed)
We called patient to review eligibility. Patient is a referral from [facility] at [facility]. He lives with his mother Ander SladeJoy [phone].

## 2015-05-10 ENCOUNTER — Encounter: Payer: Self-pay | Admitting: Emergency Medicine

## 2015-05-10 DIAGNOSIS — Z5321 Procedure and treatment not carried out due to patient leaving prior to being seen by health care provider: Secondary | ICD-10-CM | POA: Insufficient documentation

## 2015-05-10 DIAGNOSIS — K5900 Constipation, unspecified: Secondary | ICD-10-CM | POA: Insufficient documentation

## 2015-05-10 NOTE — ED Notes (Signed)
Pt presesnts to ED with epigastric abd pain, nausea, and constipation. Pt states he thinks he may have pancreatitis. Pt states he has a hx of the same and his current symptoms feel similar. Denies vomiting. Last bowel movement was 2 days ago. Pt alert and clam at this time with no increased work of breathing or acute distress noted.

## 2015-05-11 ENCOUNTER — Emergency Department
Admission: EM | Admit: 2015-05-11 | Discharge: 2015-05-11 | Disposition: A | Discharge status: Home / Self Care | Payer: Self-pay | Attending: Emergency Medicine | Admitting: Emergency Medicine

## 2015-05-11 LAB — COMPREHENSIVE METABOLIC PANEL
ALK PHOS: 98 U/L (ref 38–126)
ALT: 161 U/L — ABNORMAL HIGH (ref 17–63)
AST: 298 U/L — AB (ref 15–41)
Albumin: 5.1 g/dL — ABNORMAL HIGH (ref 3.5–5.0)
Anion gap: 20 — ABNORMAL HIGH (ref 5–15)
BILIRUBIN TOTAL: 1.5 mg/dL — AB (ref 0.3–1.2)
BUN: 7 mg/dL (ref 6–20)
CALCIUM: 9.2 mg/dL (ref 8.9–10.3)
CO2: 22 mmol/L (ref 22–32)
CREATININE: 0.69 mg/dL (ref 0.61–1.24)
Chloride: 94 mmol/L — ABNORMAL LOW (ref 101–111)
GFR calc Af Amer: >60 mL/min (ref >60)
Glucose, Bld: 88 mg/dL (ref 65–99)
POTASSIUM: 3.6 mmol/L (ref 3.5–5.1)
Sodium: 136 mmol/L (ref 135–145)
TOTAL PROTEIN: 10 g/dL — AB (ref 6.5–8.1)

## 2015-05-11 LAB — URINALYSIS COMPLETE WITH MICROSCOPIC (ARMC ONLY)
BILIRUBIN URINE: NEGATIVE
GLUCOSE, UA: NEGATIVE mg/dL
LEUKOCYTES UA: NEGATIVE
NITRITE: NEGATIVE
Protein, ur: 100 mg/dL — AB
RBC / HPF: NONE SEEN RBC/hpf (ref 0–5)
SPECIFIC GRAVITY, URINE: 1.009 (ref 1.005–1.030)
pH: 6 (ref 5.0–8.0)

## 2015-05-11 LAB — CBC
HEMATOCRIT: 44.9 % (ref 40.0–52.0)
Hemoglobin: 15.4 g/dL (ref 13.0–18.0)
MCH: 34.9 pg — ABNORMAL HIGH (ref 26.0–34.0)
MCHC: 34.4 g/dL (ref 32.0–36.0)
MCV: 101.5 fL — ABNORMAL HIGH (ref 80.0–100.0)
PLATELETS: 114 10*3/uL — AB (ref 150–440)
RBC: 4.43 MIL/uL (ref 4.40–5.90)
RDW: 13.9 % (ref 11.5–14.5)
WBC: 6.8 10*3/uL (ref 3.8–10.6)

## 2015-05-11 LAB — LIPASE, BLOOD: Lipase: 964 U/L — ABNORMAL HIGH (ref 11–51)

## 2015-05-12 ENCOUNTER — Telehealth: Payer: Self-pay | Admitting: Emergency Medicine

## 2015-05-12 NOTE — ED Notes (Signed)
Called patient due to lwot to inquire about condition and follow up plans. Says he still has pain. Has no pcp and no insurance.  i explained [facility] to him.  i told him that he could return to the ed for this illness. i explained that his lipase was elevated.  He says he already felt that it was pancreatitis.  He did not commit to returning at this time, but i recommended that he have a physician exam.

## 2015-05-26 ENCOUNTER — Emergency Department
Admission: EM | Admit: 2015-05-26 | Discharge: 2015-05-27 | Disposition: A | Discharge status: Other Acute Inpatient Hospital | Payer: Self-pay | Attending: Emergency Medicine | Admitting: Emergency Medicine

## 2015-05-26 ENCOUNTER — Encounter: Payer: Self-pay | Admitting: Emergency Medicine

## 2015-05-26 DIAGNOSIS — F1994 Other psychoactive substance use, unspecified with psychoactive substance-induced mood disorder: Secondary | ICD-10-CM

## 2015-05-26 DIAGNOSIS — F32A Depression, unspecified: Secondary | ICD-10-CM

## 2015-05-26 DIAGNOSIS — F10920 Alcohol use, unspecified with intoxication, uncomplicated: Secondary | ICD-10-CM

## 2015-05-26 DIAGNOSIS — K7010 Alcoholic hepatitis without ascites: Secondary | ICD-10-CM

## 2015-05-26 DIAGNOSIS — F17210 Nicotine dependence, cigarettes, uncomplicated: Secondary | ICD-10-CM | POA: Insufficient documentation

## 2015-05-26 DIAGNOSIS — F329 Major depressive disorder, single episode, unspecified: Secondary | ICD-10-CM | POA: Insufficient documentation

## 2015-05-26 DIAGNOSIS — Z8669 Personal history of other diseases of the nervous system and sense organs: Secondary | ICD-10-CM | POA: Insufficient documentation

## 2015-05-26 DIAGNOSIS — F10239 Alcohol dependence with withdrawal, unspecified: Secondary | ICD-10-CM | POA: Diagnosis present

## 2015-05-26 DIAGNOSIS — F10120 Alcohol abuse with intoxication, uncomplicated: Secondary | ICD-10-CM | POA: Insufficient documentation

## 2015-05-26 DIAGNOSIS — F1010 Alcohol abuse, uncomplicated: Secondary | ICD-10-CM | POA: Diagnosis present

## 2015-05-26 DIAGNOSIS — F10939 Alcohol use, unspecified with withdrawal, unspecified: Secondary | ICD-10-CM | POA: Diagnosis present

## 2015-05-26 LAB — CBC
HCT: 39.5 % — ABNORMAL LOW (ref 40.0–52.0)
Hemoglobin: 13.6 g/dL (ref 13.0–18.0)
MCH: 34.4 pg — ABNORMAL HIGH (ref 26.0–34.0)
MCHC: 34.4 g/dL (ref 32.0–36.0)
MCV: 100.2 fL — AB (ref 80.0–100.0)
PLATELETS: 301 10*3/uL (ref 150–440)
RBC: 3.95 MIL/uL — AB (ref 4.40–5.90)
RDW: 14.3 % (ref 11.5–14.5)
WBC: 8.3 10*3/uL (ref 3.8–10.6)

## 2015-05-26 LAB — SALICYLATE LEVEL: Salicylate Lvl: <4 mg/dL (ref 2.8–30.0)

## 2015-05-26 LAB — COMPREHENSIVE METABOLIC PANEL
ALBUMIN: 4.6 g/dL (ref 3.5–5.0)
ALT: 431 U/L — ABNORMAL HIGH (ref 17–63)
ANION GAP: 12 (ref 5–15)
AST: 569 U/L — AB (ref 15–41)
Alkaline Phosphatase: 96 U/L (ref 38–126)
BUN: 6 mg/dL (ref 6–20)
CHLORIDE: 104 mmol/L (ref 101–111)
CO2: 28 mmol/L (ref 22–32)
Calcium: 9.9 mg/dL (ref 8.9–10.3)
Creatinine, Ser: 0.78 mg/dL (ref 0.61–1.24)
GFR calc Af Amer: >60 mL/min (ref >60)
Glucose, Bld: 118 mg/dL — ABNORMAL HIGH (ref 65–99)
POTASSIUM: 4.2 mmol/L (ref 3.5–5.1)
Sodium: 144 mmol/L (ref 135–145)
TOTAL PROTEIN: 9 g/dL — AB (ref 6.5–8.1)
Total Bilirubin: 0.8 mg/dL (ref 0.3–1.2)

## 2015-05-26 LAB — ETHANOL: ALCOHOL ETHYL (B): 355 mg/dL — AB (ref <5)

## 2015-05-26 LAB — ACETAMINOPHEN LEVEL

## 2015-05-26 NOTE — ED Notes (Addendum)
Lab call for ETOH critical of 355 for this patient, alerting MD
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
## 2015-05-27 ENCOUNTER — Inpatient Hospital Stay (HOSPITAL_COMMUNITY)
Admission: AD | Admit: 2015-05-27 | Discharge: 2015-05-30 | DRG: 897 | Disposition: A | Discharge status: Home / Self Care | Payer: No Typology Code available for payment source | Source: Intra-hospital | Attending: Psychiatry | Admitting: Psychiatry

## 2015-05-27 DIAGNOSIS — F17210 Nicotine dependence, cigarettes, uncomplicated: Secondary | ICD-10-CM | POA: Diagnosis present

## 2015-05-27 DIAGNOSIS — F10230 Alcohol dependence with withdrawal, uncomplicated: Secondary | ICD-10-CM | POA: Diagnosis not present

## 2015-05-27 DIAGNOSIS — F10239 Alcohol dependence with withdrawal, unspecified: Principal | ICD-10-CM | POA: Diagnosis present

## 2015-05-27 DIAGNOSIS — F319 Bipolar disorder, unspecified: Secondary | ICD-10-CM | POA: Diagnosis present

## 2015-05-27 DIAGNOSIS — Y908 Blood alcohol level of 240 mg/100 ml or more: Secondary | ICD-10-CM | POA: Diagnosis present

## 2015-05-27 DIAGNOSIS — K7010 Alcoholic hepatitis without ascites: Secondary | ICD-10-CM

## 2015-05-27 DIAGNOSIS — F1020 Alcohol dependence, uncomplicated: Secondary | ICD-10-CM | POA: Diagnosis present

## 2015-05-27 DIAGNOSIS — F1994 Other psychoactive substance use, unspecified with psychoactive substance-induced mood disorder: Secondary | ICD-10-CM

## 2015-05-27 LAB — URINE DRUG SCREEN, QUALITATIVE (ARMC ONLY)
AMPHETAMINES, UR SCREEN: NOT DETECTED
BENZODIAZEPINE, UR SCRN: POSITIVE — AB
Barbiturates, Ur Screen: NOT DETECTED
Cannabinoid 50 Ng, Ur ~~LOC~~: NOT DETECTED
Cocaine Metabolite,Ur ~~LOC~~: NOT DETECTED
MDMA (Ecstasy)Ur Screen: NOT DETECTED
METHADONE SCREEN, URINE: NOT DETECTED
OPIATE, UR SCREEN: NOT DETECTED
Phencyclidine (PCP) Ur S: NOT DETECTED
TRICYCLIC, UR SCREEN: NOT DETECTED

## 2015-05-27 MED ORDER — LORAZEPAM 2 MG PO TABS: 0.0000 mg | ORAL_TABLET | Freq: Four times a day (QID) | ORAL | Status: DC

## 2015-05-27 MED ORDER — MAGNESIUM HYDROXIDE 400 MG/5ML PO SUSP: 30.0000 mL | Freq: Every day | ORAL | Status: DC | PRN

## 2015-05-27 MED ORDER — TRAZODONE HCL 50 MG PO TABS
50.0000 mg | ORAL_TABLET | Freq: Every evening | ORAL | Status: DC | PRN
  Filled 2015-05-27: qty 14

## 2015-05-27 MED ORDER — VITAMIN B-1 100 MG PO TABS
100.0000 mg | ORAL_TABLET | Freq: Every day | ORAL | Status: DC
  Administered 2015-05-27: 100 mg via ORAL
  Filled 2015-05-27: qty 1

## 2015-05-27 MED ORDER — LORAZEPAM 1 MG PO TABS
1.0000 mg | ORAL_TABLET | Freq: Four times a day (QID) | ORAL | Status: DC
  Administered 2015-05-28: 1 mg via ORAL
  Filled 2015-05-27: qty 1

## 2015-05-27 MED ORDER — LORAZEPAM 2 MG/ML IJ SOLN
2.0000 mg | INTRAMUSCULAR | Status: AC
  Administered 2015-05-27: 2 mg via INTRAMUSCULAR
  Filled 2015-05-27: qty 1

## 2015-05-27 MED ORDER — ALUM & MAG HYDROXIDE-SIMETH 200-200-20 MG/5ML PO SUSP: 30.0000 mL | ORAL | Status: DC | PRN

## 2015-05-27 MED ORDER — SODIUM CHLORIDE 0.9 % IV BOLUS (SEPSIS)
1000.0000 mL | Freq: Once | INTRAVENOUS | Status: AC
  Administered 2015-05-27: 1000 mL via INTRAVENOUS

## 2015-05-27 MED ORDER — ACETAMINOPHEN 325 MG PO TABS: 650.0000 mg | ORAL_TABLET | Freq: Four times a day (QID) | ORAL | Status: DC | PRN

## 2015-05-27 MED ORDER — LORAZEPAM 1 MG PO TABS: 1.0000 mg | ORAL_TABLET | Freq: Four times a day (QID) | ORAL | Status: DC | PRN

## 2015-05-27 NOTE — ED Notes (Signed)

## 2015-05-27 NOTE — ED Notes (Signed)
BEHAVIORAL HEALTH ROUNDING Patient sleeping: Yes.   Patient alert and oriented:yes Behavior appropriate: yes; If no, describe: Nutrition and fluids offered: pt sleeping Toileting and hygiene offered: pt sleeping Sitter present: no Law enforcement present: yes

## 2015-05-27 NOTE — ED Notes (Signed)

## 2015-05-27 NOTE — ED Notes (Signed)
Pt resting in bed.

## 2015-05-27 NOTE — ED Notes (Signed)
Patient was brought into the Emergency Department by mother because he had relapsed and begun drinking vodka last night. Patient currently has no suicidal thoughts, hallucinations, or homicidal ideation. Patient denies current depression and anxiety but says that he has been feeling depressed for the past couple of days. Patient is currently calm and cooperative but has a calm affect yet anxious mood. Will continue to monitor. Maintained on 15 minute checks and observation by security camera for safety.

## 2015-05-27 NOTE — ED Notes (Signed)
ENVIRONMENTAL ASSESSMENT Potentially harmful objects out of patient reach: Yes Personal belongings secured: Yes Patient dressed in hospital provided attire only: Yes Plastic bags out of patient reach: Yes Patient care equipment (cords, cables, call bells, lines, and drains) shortened, removed, or accounted for: Yes Equipment and supplies removed from bottom of stretcher: Yes Potentially toxic materials out of patient reach: Yes Sharps container removed or out of patient reach: Yes  Patient currently in room watching television. No signs of distress noted. Maintained on 15 minute checks and observation by security camera for safety.  

## 2015-05-27 NOTE — ED Notes (Signed)
Pt sleeping in bed

## 2015-05-27 NOTE — ED Notes (Signed)
Patient admits to having seizures and DTs when beginning alcohol withdrawal. Per criteria, patient can no longer stay in the BHU due to DT and seizure history. Criteria discussed with psychiatrist and quad nurse. Will medicate patient per orders and then transfer patient back to emergency room.

## 2015-05-27 NOTE — BH Assessment (Signed)
Assessment Note  Jerry Dunn is an 35 y.o. male. Mr. Szczesniak arrived to the ED by way of the [facility] department.  He states that his mother was upset that he was drinking, so she called the sheriff's department to come get him and bring him to the hospital. He states that he normally feels depressed, but not more depressed than normal.  He denied symptoms of anxiety. He denied having auditory or visual hallucinations. He denied suicidal or homicidal ideation or intent.  He reports drinking a few shots of vodka tonight. He reports no major stressor or life changes that have  occurred in recent months.  IVC documentation states Mr. Porter has "History of alcoholism, threatened to jump from moving vehicle".  IVC paperwork was taken out by his mother.  Diagnosis: Bipolar Disorder, Alcohol Abuse  Past Medical History:  Past Medical History  Diagnosis Date  . Pancreatitis   . Alcohol abuse     Past Surgical History  Procedure Laterality Date  . None      Family History:  Family History  Problem Relation Age of Onset  . Diabetes Mother   . Hypertension Father     Social History:  reports that he has been smoking Cigarettes.  He has a 16 pack-year smoking history. He does not have any smokeless tobacco history on file. He reports that he drinks alcohol. He reports that he does not use illicit drugs.  Additional Social History:  Alcohol / Drug Use History of alcohol / drug use?: Yes Substance #1 Name of Substance 1: Alcohol 1 - Age of First Use: 17 1 - Amount (size/oz): a pint to a fifth of vodka  1 - Frequency: daily 1 - Last Use / Amount: 05/26/2015  CIWA: CIWA-Ar BP: (!) 130/91 mmHg Pulse Rate: (!) 108 COWS:    Allergies:  Allergies  Allergen Reactions  . Bee Venom Swelling    Home Medications:  (Not in a hospital admission)  OB/GYN Status:  No LMP for male patient.  General Assessment Data Location of Assessment: [facility] ED TTS Assessment: In system Is this a  Tele or Face-to-Face Assessment?: Face-to-Face Is this an Initial Assessment or a Re-assessment for this encounter?: Initial Assessment Marital status: Single Maiden name: n/a Is patient pregnant?: No Pregnancy Status: No Living Arrangements: Parent (Mother) Can pt return to current living arrangement?: Yes Admission Status: Involuntary Is patient capable of signing voluntary admission?: Yes Referral Source: Self/Family/Friend Insurance type: None  Medical Screening Exam [facility] Walk-in ONLY) Medical Exam completed: Yes  Crisis Care Plan Living Arrangements: Parent (Mother) Legal Guardian: Other: (Self) Name of Psychiatrist: Denied Name of Therapist: denied  Education Status Is patient currently in school?: No Current Grade: n/a Highest grade of school patient has completed: some college Name of school: [employer] person: n/a  Risk to self with the past 6 months Suicidal Ideation: No Has patient been a risk to self within the past 6 months prior to admission? : No Suicidal Intent: No Has patient had any suicidal intent within the past 6 months prior to admission? : No Is patient at risk for suicide?: No Suicidal Plan?: No Has patient had any suicidal plan within the past 6 months prior to admission? : No Access to Means: No What has been your use of drugs/alcohol within the last 12 months?: use of alcohol daily Previous Attempts/Gestures: No How many times?: 0 Other Self Harm Risks: denied Triggers for Past Attempts: None known Intentional Self Injurious Behavior: None Family Suicide History:  No Recent stressful life event(s): Other (Comment) (None reported) Persecutory voices/beliefs?: No Depression: No Depression Symptoms: Despondent Substance abuse history and/or treatment for substance abuse?: Yes Suicide prevention information given to non-admitted patients: Not applicable  Risk to Others within the past 6 months Homicidal Ideation: No Does patient have any  lifetime risk of violence toward others beyond the six months prior to admission? : No Thoughts of Harm to Others: No Current Homicidal Intent: No Current Homicidal Plan: No Access to Homicidal Means: No Identified Victim: none identified History of harm to others?: No Assessment of Violence: None Noted Violent Behavior Description: denied Does patient have access to weapons?: No Criminal Charges Pending?: No Does patient have a court date: Yes Court Date: 06/11/15 Is patient on probation?: Yes  Psychosis Hallucinations: None noted Delusions: None noted  Mental Status Report Appearance/Hygiene: In scrubs Eye Contact: Fair Motor Activity: Unremarkable Speech: Logical/coherent Level of Consciousness: Alert Mood: Irritable Affect: Appropriate to circumstance Anxiety Level: None Thought Processes: Coherent Judgement: Unimpaired Orientation: Person, Time, Place, Situation Obsessive Compulsive Thoughts/Behaviors: None  Cognitive Functioning Concentration: Good Memory: Recent Intact IQ: Average Insight: Fair Impulse Control: Fair Appetite: Fair Sleep: No Change Vegetative Symptoms: None  ADLScreening [facility] Assessment Services) Patient's cognitive ability adequate to safely complete daily activities?: Yes Patient able to express need for assistance with ADLs?: Yes Independently performs ADLs?: Yes (appropriate for developmental age)  Prior Inpatient Therapy Prior Inpatient Therapy: Yes Prior Therapy Dates: 2016 Prior Therapy Facilty/Provider(s): [facility] Reason for Treatment: Bipolar Disorder  Prior Outpatient Therapy Prior Outpatient Therapy: Yes Prior Therapy Dates: 2016 Prior Therapy Facilty/Provider(s[facility] Reason for Treatment: Bipolar (Services discontinued) Does patient have an ACCT team?: No Does patient have Intensive In-House Services?  : No Does patient have [facility] services? : No Does patient have P4CC services?: No  ADL  Screening (condition at time of admission) Patient's cognitive ability adequate to safely complete daily activities?: Yes Patient able to express need for assistance with ADLs?: Yes Independently performs ADLs?: Yes (appropriate for developmental age)       Abuse/Neglect Assessment (Assessment to be complete while patient is alone) Physical Abuse: Denies Verbal Abuse: Denies Sexual Abuse: Denies Exploitation of patient/patient's resources: Denies Self-Neglect: Denies Values / Beliefs Cultural Requests During Hospitalization: None   Advance Directives (For Healthcare) Does patient have an advance directive?: No Would patient like information on creating an advanced directive?: No - patient declined information    Additional Information 1:1 In Past 12 Months?: No CIRT Risk: No Elopement Risk: No Does patient have medical clearance?: Yes     Disposition:  Disposition Initial Assessment Completed for this Encounter: Yes Disposition of Patient: Other dispositions  On Site Evaluation by:   Reviewed with Physician:    Justice DeedsKeisha Brelyn Woehl 05/27/2015 3:03 AM

## 2015-05-27 NOTE — ED Notes (Signed)
Pt informed we are waiting on urine. Pt will try in a little while.

## 2015-05-27 NOTE — ED Notes (Signed)

## 2015-05-27 NOTE — ED Notes (Signed)
BEHAVIORAL HEALTH ROUNDING Patient sleeping: No. Patient alert and oriented: yes Behavior appropriate: Yes.  ; If no, describe:  Nutrition and fluids offered: Yes  Toileting and hygiene offered: Yes  Sitter present: no Law enforcement present: Yes  

## 2015-05-27 NOTE — Consult Note (Signed)
[place] Psychiatry Consult   Reason for Consult:  This is a 35 year old man with a history of alcohol abuse who was brought to the hospital under IVC alleging that he had threatened to jump out of car with all intoxicated Referring Physician:McShane Patient Identification: Sahan Pen MRN:  [id number] Principal Diagnosis: Alcohol withdrawal [facility]) Diagnosis:   Patient Active Problem List   Diagnosis Date Noted  . Alcoholic hepatitis [J19.41] 05/27/2015  . Substance induced mood disorder ([place]) [F19.94] 05/27/2015  . Delirium tremens ([place]) [F10.231] 04/03/2015  . Chronic pancreatitis ([place]) [K86.1] 04/03/2015  . Seizure ([place]) [R56.9] 04/02/2015  . Alcoholic pancreatitis [D40.8] 07/31/2014  . Rectal bleeding [K62.5] 07/31/2014  . Alcohol withdrawal ([place]) [F10.239] 07/31/2014  . Hyponatremia [E87.1] 07/31/2014  . Encephalopathy acute [G93.40] 07/31/2014  . Alcohol abuse [F10.10]     Total Time spent with patient: 1 hour  Subjective:   Izaiha Lo is a 35 y.o. male patient admitted with "I've been drinking alcohol. My parents don't approve."  .  HPI:  Asian interviewed. Chart reviewed including my previous evaluations in previous medical evaluation. Current labs reviewed. Case discussed with emergency room physician and nursing. 35 year old man was brought to the hospital with commitment paperwork stating that he had threatened to jump out of a moving car. Patient reports that he does not remember that. All he remembers that he is that he has still been drinking heavily. He estimates his alcohol consumption out about a pint of liquor a day although he admits that his memory is not too good about that. He says he's been drinking pretty much daily since he got out of the hospital last time which was just last week. Denies that he is using any other drugs. Admits that his mood feels down and dysphoric much of the time. He denies having any current suicidal thoughts. He denies  having any hallucinations right now. Admits that his sleep is erratic. Appetite and oral intake is erratic when he is drinking. He has not yet had any seizures this particular episode but he was just in [facility] last week for alcohol withdrawal seizures.  Medical history: Patient has a history of medical complications of alcohol abuse. History of recurrent episodes of pancreatitis. History of delirium tremens and alcohol withdrawal seizures now becoming routine. Not yet diagnosed with cirrhosis but his liver enzymes are very high right now.  Social history: Patient lives with his parents. When he does work he works at an [employer] run by his family but he admits that he has not been able to do any work recently. Doesn't do much of anything except sit around the house and drink anymore.  Substance abuse history: Long-standing alcohol use problems going back years. Medical complications appear to of accelerated. He was in our hospital for delirium tremens not long ago and after release immediately started drinking. [facility] with alcohol withdrawal seizures and again went right back to drinking. Uses any other drugs. He denies having been in any true substance abuse treatment programs. The closest he gets as when he was at [facility] at the end of 2016 or beginning of 2017.  Past Psychiatric History: Patient was admitted to [facility] near the beginning of the year this year. That was for alcohol withdrawal and for mood. Apparently at [facility] they diagnosed him with bipolar disorder. Seem profoundly convincing to me. He was briefly on lamotrigine and Seroquel without any clear improvement  and discontinued them. Has not been following up with psychiatric treatment. Denies history of suicide attempts. Has a history of erratic behavior mostly while intoxicated.  Risk to Self: Suicidal Ideation: No Suicidal Intent: No Is patient at risk for  suicide?: No Suicidal Plan?: No Access to Means: No What has been your use of drugs/alcohol within the last 12 months?: use of alcohol daily How many times?: 0 Other Self Harm Risks: denied Triggers for Past Attempts: None known Intentional Self Injurious Behavior: None Risk to Others: Homicidal Ideation: No Thoughts of Harm to Others: No Current Homicidal Intent: No Current Homicidal Plan: No Access to Homicidal Means: No Identified Victim: none identified History of harm to others?: No Assessment of Violence: None Noted Violent Behavior Description: denied Does patient have access to weapons?: No Criminal Charges Pending?: No Does patient have a court date: Yes Court Date: 06/11/15 Prior Inpatient Therapy: Prior Inpatient Therapy: Yes Prior Therapy Dates: 2016 Prior Therapy Facilty/Provider(s): [facility] Reason for Treatment: Bipolar Disorder Prior Outpatient Therapy: Prior Outpatient Therapy: Yes Prior Therapy Dates: 2016 Prior Therapy Facilty/Provider(s): [facility] Reason for Treatment: Bipolar (Services discontinued) Does patient have an ACCT team?: No Does patient have Intensive In-House Services?  : No Does patient have [facility] services? : No Does patient have P4CC services?: No  Past Medical History:  Past Medical History  Diagnosis Date  . Pancreatitis   . Alcohol abuse     Past Surgical History  Procedure Laterality Date  . None     Family History:  Family History  Problem Relation Age of Onset  . Diabetes Mother   . Hypertension Father    Family Psychiatric  History: Patient says there are alcohol problems in his family. Also says that his sister thinks everyone has bipolar disorder. Doesn't have any more details than that.   cial History:  History  Alcohol Use  . 0.0 oz/week  . 0 Standard drinks or equivalent per week    Comment: 10 beers daily     History  Drug Use No    Social History   Social History  . Marital Status:  Single    Spouse Name: N/A  . Number of Children: N/A  . Years of Education: N/A   Occupational History  . unemployed    Social History Main Topics  . Smoking status: Current Every Day Smoker -- 1.00 packs/day for 16 years    Types: Cigarettes  . Smokeless tobacco: None  . Alcohol Use: 0.0 oz/week    0 Standard drinks or equivalent per week     Comment: 10 beers daily  . Drug Use: No  . Sexual Activity: Not Asked   Other Topics Concern  . None   Social History Narrative   Lives with parents at home   Additional Social History:    Allergies:   Allergies  Allergen Reactions  . Bee Venom Swelling    Labs:  Results for orders placed or performed during the hospital encounter of 05/26/15 (from the past 48 hour(s))  Comprehensive metabolic panel     Status: Abnormal   Collection Time: 05/26/15 11:04 PM  Result Value Ref Range   Sodium 144 135 - 145 mmol/L   Potassium 4.2 3.5 - 5.1 mmol/L   Chloride 104 101 - 111 mmol/L   CO2 28 22 - 32 mmol/L   Glucose, Bld 118 (H) 65 - 99 mg/dL   BUN 6 6 - 20 mg/dL   Creatinine, Ser 0.78 0.61 - 1.24  mg/dL   Calcium 9.9 8.9 - 10.3 mg/dL   Total Protein 9.0 (H) 6.5 - 8.1 g/dL   Albumin 4.6 3.5 - 5.0 g/dL   AST 569 (H) 15 - 41 U/L   ALT 431 (H) 17 - 63 U/L   Alkaline Phosphatase 96 38 - 126 U/L   Total Bilirubin 0.8 0.3 - 1.2 mg/dL   GFR calc non Af Amer >60 >60 mL/min   GFR calc Af Amer >60 >60 mL/min    Comment: (NOTE) The eGFR has been calculated using the CKD EPI equation. This calculation has not been validated in all clinical situations. eGFR's persistently <60 mL/min signify possible Chronic Kidney Disease.    Anion gap 12 5 - 15  Ethanol     Status: Abnormal   Collection Time: 05/26/15 11:04 PM  Result Value Ref Range   Alcohol, Ethyl (B) 355 (HH) <5 mg/dL    Comment: CRITICAL RESULT CALLED TO, READ BACK BY AND VERIFIED WITH DAVID WALKER ON 05/26/15 AT 2348 PM BY TLB        LOWEST DETECTABLE LIMIT FOR SERUM ALCOHOL  IS 5 mg/dL FOR MEDICAL PURPOSES ONLY   Salicylate level     Status: None   Collection Time: 05/26/15 11:04 PM  Result Value Ref Range   Salicylate Lvl <3.2 2.8 - 30.0 mg/dL  Acetaminophen level     Status: Abnormal   Collection Time: 05/26/15 11:04 PM  Result Value Ref Range   Acetaminophen (Tylenol), Serum <10 (L) 10 - 30 ug/mL    Comment:        THERAPEUTIC CONCENTRATIONS VARY SIGNIFICANTLY. A RANGE OF 10-30 ug/mL MAY BE AN EFFECTIVE CONCENTRATION FOR MANY PATIENTS. HOWEVER, SOME ARE BEST TREATED AT CONCENTRATIONS OUTSIDE THIS RANGE. ACETAMINOPHEN CONCENTRATIONS >150 ug/mL AT 4 HOURS AFTER INGESTION AND >50 ug/mL AT 12 HOURS AFTER INGESTION ARE OFTEN ASSOCIATED WITH TOXIC REACTIONS.   cbc     Status: Abnormal   Collection Time: 05/26/15 11:04 PM  Result Value Ref Range   WBC 8.3 3.8 - 10.6 K/uL   RBC 3.95 (L) 4.40 - 5.90 MIL/uL   Hemoglobin 13.6 13.0 - 18.0 g/dL   HCT 39.5 (L) 40.0 - 52.0 %   MCV 100.2 (H) 80.0 - 100.0 fL   MCH 34.4 (H) 26.0 - 34.0 pg   MCHC 34.4 32.0 - 36.0 g/dL   RDW 14.3 11.5 - 14.5 %   Platelets 301 150 - 440 K/uL  Urine Drug Screen, Qualitative     Status: Abnormal   Collection Time: 05/27/15  8:53 AM  Result Value Ref Range   Tricyclic, Ur Screen NONE DETECTED NONE DETECTED   Amphetamines, Ur Screen NONE DETECTED NONE DETECTED   MDMA (Ecstasy)Ur Screen NONE DETECTED NONE DETECTED   Cocaine Metabolite,Ur [place] NONE DETECTED NONE DETECTED   Opiate, Ur Screen NONE DETECTED NONE DETECTED   Phencyclidine (PCP) Ur S NONE DETECTED NONE DETECTED   Cannabinoid 50 Ng, Ur [place] NONE DETECTED NONE DETECTED   Barbiturates, Ur Screen NONE DETECTED NONE DETECTED   Benzodiazepine, Ur Scrn POSITIVE (A) NONE DETECTED   Methadone Scn, Ur NONE DETECTED NONE DETECTED    Comment: (NOTE) 951  Tricyclics, urine               Cutoff 1000 ng/mL 200  Amphetamines, urine             Cutoff 1000 ng/mL 300  MDMA (Ecstasy), urine           Cutoff 500 ng/mL 400  Cocaine  Metabolite, urine       Cutoff 300 ng/mL 500  Opiate, urine                   Cutoff 300 ng/mL 600  Phencyclidine (PCP), urine      Cutoff 25 ng/mL 700  Cannabinoid, urine              Cutoff 50 ng/mL 800  Barbiturates, urine             Cutoff 200 ng/mL 900  Benzodiazepine, urine           Cutoff 200 ng/mL 1000 Methadone, urine                Cutoff 300 ng/mL 1100 1200 The urine drug screen provides only a preliminary, unconfirmed 1300 analytical test result and should not be used for non-medical 1400 purposes. Clinical consideration and professional judgment should 1500 be applied to any positive drug screen result due to possible 1600 interfering substances. A more specific alternate chemical method 1700 must be used in order to obtain a confirmed analytical result.  1800 Gas chromato graphy / mass spectrometry (GC/MS) is the preferred 1900 confirmatory method.     Current Facility-Administered Medications  Medication Dose Route Frequency Provider Last Rate Last Dose  . LORazepam (ATIVAN) tablet 0-4 mg  0-4 mg Oral Q6H Paulette Blanch, MD   0 mg at 05/27/15 0200  . thiamine (VITAMIN B-1) tablet 100 mg  100 mg Oral Daily Paulette Blanch, MD   100 mg at 05/27/15 8811   Current Outpatient Prescriptions  Medication Sig Dispense Refill  . magnesium oxide (MAG-OX) 400 MG tablet Take 400 mg by mouth 2 (two) times daily.    . Multiple Vitamins-Minerals (MULTIVITAMIN WITH MINERALS) tablet Take 1 tablet by mouth daily.    . pantoprazole (PROTONIX) 40 MG tablet Take 40 mg by mouth daily.    Marland Kitchen thiamine 100 MG tablet Take 1 tablet (100 mg total) by mouth daily.      Musculoskeletal: Strength & Muscle Tone: decreased Gait & Station: ataxic Patient leans: N/A  Psychiatric Specialty Exam: Review of Systems  Constitutional: Positive for malaise/fatigue.  HENT: Negative.   Eyes: Negative.   Respiratory: Negative.   Cardiovascular: Negative.   Gastrointestinal: Negative.   Musculoskeletal:  Negative.   Skin: Negative.   Neurological: Positive for tremors, sensory change and weakness.  Psychiatric/Behavioral: Positive for depression, memory loss and substance abuse. Negative for suicidal ideas and hallucinations. The patient is nervous/anxious and has insomnia.     Blood pressure 114/83, pulse 74, temperature 97.9 F (36.6 C), temperature source Oral, resp. rate 14, height 5' 7"  (1.702 m), weight 68.04 kg (150 lb), SpO2 98 %.Body mass index is 23.49 kg/(m^2).  General Appearance: Disheveled  Eye Contact::  Minimal  Speech:  Garbled and Slow  Volume:  Decreased  Mood:  Dysphoric  Affect:  Flat  Thought Process:  Circumstantial  Orientation:  Full (Time, Place, and Person)  Thought Content:  Negative  Suicidal Thoughts:  No  Homicidal Thoughts:  No  Memory:  Immediate;   Good Recent;   Fair Remote;   Fair  Judgement:  Impaired  Insight:  Shallow  Psychomotor Activity:  Psychomotor Retardation and Tremor  Concentration:  Poor  Recall:  Poor  Fund of Knowledge:Poor  Language: Poor  Akathisia:  No  Handed:  Right  AIMS (if indicated):     Assets:  Desire for Improvement Housing Social Support  ADL's:  Impaired  Cognition: Impaired,  Mild  Sleep:      Treatment Plan Summary: Daily contact with patient to assess and evaluate symptoms and progress in treatment, Medication management and Plan 35 year old man with alcohol dependence presented last night with a blood alcohol level over 350. Patient reportedly was threatening to jump out of a moving car. He doesn't remember it. Currently his behavior is calm but his affect is flat in his thinking appears to be very slow. He is twitching all over and looks like he may be about to have worsening withdrawal. Patient has an established history of alcohol withdrawal seizures and delirium tremens. I am concerned about the risk of worsening physical symptoms outside of the hospital however he does not meet criteria for admission to  the psychiatry ward here. Case reviewed with emergency room physician and TTS staff. We have apparently made an agreement to have him admitted to observation bed at [facility] which I think would be a safe compromise. IVC will be discontinued because of that. Patient will be referred to observation. I have given him some more Ativan now and we will try and give him more medicine to help to keep him calm and out of any worsening DTs or seizures while we get him transferred to the hospital. Patient understands plan.  Disposition: Supportive therapy provided about ongoing stressors.  Alethia Berthold, MD 05/27/2015 2:45 PM

## 2015-05-27 NOTE — ED Notes (Signed)
Pt given breakfast tray. Pt woke up to eat 

## 2015-05-27 NOTE — ED Notes (Signed)
Woke patient and attempted IV start, unsuccessful x2, asking another RN to try.

## 2015-05-28 ENCOUNTER — Encounter (HOSPITAL_COMMUNITY): Payer: Self-pay | Admitting: Nurse Practitioner

## 2015-05-28 DIAGNOSIS — F319 Bipolar disorder, unspecified: Secondary | ICD-10-CM

## 2015-05-28 DIAGNOSIS — F1020 Alcohol dependence, uncomplicated: Secondary | ICD-10-CM | POA: Diagnosis present

## 2015-05-28 DIAGNOSIS — F10230 Alcohol dependence with withdrawal, uncomplicated: Secondary | ICD-10-CM

## 2015-05-28 MED ORDER — HYDROXYZINE HCL 25 MG PO TABS
25.0000 mg | ORAL_TABLET | Freq: Four times a day (QID) | ORAL | Status: DC | PRN
  Administered 2015-05-28: 25 mg via ORAL
  Filled 2015-05-28: qty 1
  Filled 2015-05-28: qty 10

## 2015-05-28 MED ORDER — LORAZEPAM 1 MG PO TABS: 1.0000 mg | ORAL_TABLET | Freq: Every day | ORAL | Status: DC

## 2015-05-28 MED ORDER — LORAZEPAM 1 MG PO TABS
1.0000 mg | ORAL_TABLET | Freq: Four times a day (QID) | ORAL | Status: AC
  Administered 2015-05-28 – 2015-05-29 (×4): 1 mg via ORAL
  Filled 2015-05-28 (×6): qty 1

## 2015-05-28 MED ORDER — LORAZEPAM 1 MG PO TABS
1.0000 mg | ORAL_TABLET | Freq: Three times a day (TID) | ORAL | Status: DC
  Administered 2015-05-30: 1 mg via ORAL
  Filled 2015-05-28: qty 1

## 2015-05-28 MED ORDER — LORAZEPAM 1 MG PO TABS: 1.0000 mg | ORAL_TABLET | Freq: Two times a day (BID) | ORAL | Status: DC

## 2015-05-28 MED ORDER — ADULT MULTIVITAMIN W/MINERALS CH
1.0000 | ORAL_TABLET | Freq: Every day | ORAL | Status: DC
Start: 2015-05-28 — End: 2015-05-31
  Administered 2015-05-28 – 2015-05-30 (×3): 1 via ORAL
  Filled 2015-05-28 (×3): qty 1
  Filled 2015-05-28: qty 7
  Filled 2015-05-28: qty 1

## 2015-05-28 MED ORDER — LORAZEPAM 1 MG PO TABS: 1.0000 mg | ORAL_TABLET | Freq: Four times a day (QID) | ORAL | Status: DC | PRN

## 2015-05-28 MED ORDER — LOPERAMIDE HCL 2 MG PO CAPS: 2.0000 mg | ORAL_CAPSULE | ORAL | Status: DC | PRN

## 2015-05-28 MED ORDER — VITAMIN B-1 100 MG PO TABS
100.0000 mg | ORAL_TABLET | Freq: Every day | ORAL | Status: DC
  Administered 2015-05-29 – 2015-05-30 (×2): 100 mg via ORAL
  Filled 2015-05-28 (×4): qty 1

## 2015-05-28 MED ORDER — ONDANSETRON 4 MG PO TBDP: 4.0000 mg | ORAL_TABLET | Freq: Four times a day (QID) | ORAL | Status: DC | PRN

## 2015-05-28 MED ORDER — THIAMINE HCL 100 MG/ML IJ SOLN
100.0000 mg | Freq: Once | INTRAMUSCULAR | Status: AC
  Administered 2015-05-28: 100 mg via INTRAMUSCULAR
  Filled 2015-05-28: qty 2

## 2015-05-28 NOTE — Progress Notes (Signed)
ADMIT NOTE: Jerry Dunn has been admitted to the unit. He states he recently detox[facility] from [facility]. He relapsed on the 29th. He states he is employed however he has taken some time off from his job due to his drinking. He denies any illicit substance abuse. He has been drinking for the past 17 years and smoking a pack of cigarettes per day for the past 16 years.  He has a history of bipolar disorder for which he has taken lamictal and seroquel in the past but he states he stopped taking them "a while ago". He lives with his mother and father and states his father is his main support system. Rates Anxiety 4/10 and Depression 6/10. He denies any physical, verbal or sexual abuse in the past or present. He endorses difficulty sleeping at night. Denies any problems with his appetite. Denies SI/HI/AVH. Encouragement and support given. Medications administered as prescribed. Continue Q 15 minute checks for patient safety and medication effectiveness.
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
## 2015-05-28 NOTE — [facility] Group Notes (Signed)
[facility] LCSW Group Therapy  05/28/2015 3:13 PM  Type of Therapy:  Group Therapy  Participation Level:  Active  Participation Quality:  Attentive  Affect:  Appropriate  Cognitive:  Alert and Oriented  Insight:  Engaged  Engagement in Therapy:  Improving  Modes of Intervention:  Confrontation, Discussion, Education, Exploration, Problem-solving, Rapport Building, Socialization and Support  Summary of Progress/Problems: Today's Topic: Overcoming Obstacles. Patients identified one short term goal and potential obstacles in reaching this goal. Patients processed barriers involved in overcoming these obstacles. Patients identified steps necessary for overcoming these obstacles and explored motivation (internal and external) for facing these difficulties head on. Jerry Dunn was attentive and engaged during today's processing group. He shared that [facility]. "I am drinking too much and need to stop." Jerry Dunn shared that he also plans to resume [facility] groups in addition to AA. He continues to show progress in the group setting with improving insight.   Smart, Jader Desai LCSW 05/28/2015, 3:13 PM

## 2015-05-28 NOTE — Progress Notes (Signed)
Recreation Therapy Notes  Date: 05.03.2017 Time: 9:30am Location: [address] Hall Group Room   Group Topic: Stress Management  Goal Area(s) Addresses:  Patient will actively participate in stress management techniques presented during session.   Behavioral Response: Did not attend.   Raquel Racey L Nerea Bordenave, LRT/CTRS         Tonianne Fine L 05/28/2015 1:32 PM

## 2015-05-28 NOTE — H&P (Addendum)
Psychiatric Admission Assessment Adult  Patient Identification: Jerry Dunn MRN:  [id number] Date of Evaluation:  05/28/2015 Chief Complaint:  ALCOHOL WITHDRAWAL Principal Diagnosis: <principal problem not specified> Diagnosis:   Patient Active Problem List   Diagnosis Date Noted  . Alcoholic hepatitis [Y85.02] 05/27/2015  . Substance induced mood disorder ([place]) [F19.94] 05/27/2015  . Bipolar 1 disorder ([place]) [F31.9] 05/27/2015  . Delirium tremens ([place]) [F10.231] 04/03/2015  . Chronic pancreatitis ([place]) [K86.1] 04/03/2015  . Seizure ([place]) [R56.9] 04/02/2015  . 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 Rectal bleeding [K62.5] 07/31/2014  . Alcohol withdrawal ([place]) [F10.239] 07/31/2014  . Hyponatremia [E87.1] 07/31/2014  . Encephalopathy acute [G93.40] 07/31/2014  . Alcohol abuse [F10.10]    History of Present Illness:: 3 Y/ male who states that his mother is very anti alcohol she called the deputies when she saw him drinking. Had a seizure in March after he tried to quit cold [place]. Went back to drinking. States he is dealing with certain stressors: financial due to medical bills, legal issues probation for DWI 2015 convicted 2016 April. Had one in 2008. Drinking varies a lot. Tries to get 24 ounces of beer. Sometimes gets Vodka to go to sleep.  Admits to hearing voices that talk among each other but states he has not heard recently The initial assessment is as follows: 35 year old man was brought to the hospital with commitment paperwork stating that he had threatened to jump out of a moving car. Patient reports that he does not remember that. All he remembers that he is that he has still been drinking heavily. He estimates his alcohol consumption out about a pint of liquor a day although he admits that his memory is not too good about that. He says he's been drinking pretty much daily since he got out of the hospital last time which was just last week. Denies that he is using any  other drugs. Admits that his mood feels down and dysphoric much of the time. He denies having any current suicidal thoughts. He denies having any hallucinations right now. Admits that his sleep is erratic. Appetite and oral intake is erratic when he is drinking. He has not yet had any seizures this particular episode but he was just in [facility] last week for alcohol withdrawal seizures. History of recurrent episodes of pancreatitis. History of delirium tremens and alcohol withdrawal seizures now becoming routine.  Associated Signs/Symptoms: Depression Symptoms:  depressed mood, anhedonia, anxiety, disturbed sleep, (Hypo) Manic Symptoms:  denies Anxiety Symptoms:  Excessive Worry, Psychotic Symptoms:  denies PTSD Symptoms: Negative Total Time spent with patient: 45 minutes  Past Psychiatric History:   Is the patient at risk to self? No.  Has the patient been a risk to self in the past 6 months? No.  Has the patient been a risk to self within the distant past? No.  Is the patient a risk to others? No.  Has the patient been a risk to others in the past 6 months? No.  Has the patient been a risk to others within the distant past? No.   Prior Inpatient Therapy:  [facility]) Prior Outpatient Therapy:  [facility]  Alcohol Screening: 1. How often do you have a drink containing alcohol?: 4 or more times a week 2. How many drinks containing alcohol do you have on a typical day when you are drinking?: 7, 8, or 9 3. How often do you have six or more drinks on one occasion?: Weekly Preliminary Score: 6  4. How often during the last year have you found that you were not able to stop drinking once you had started?: Never 5. How often during the last year have you failed to do what was normally expected from you becasue of drinking?: Monthly 6. How often during the last year have you needed a first drink in the morning to get yourself going after a heavy drinking session?:  Never 7. How often during the last year have you had a feeling of guilt of remorse after drinking?: Monthly 8. How often during the last year have you been unable to remember what happened the night before because you had been drinking?: Monthly 9. Have you or someone else been injured as a result of your drinking?: No 10. Has a relative or friend or a doctor or another health worker been concerned about your drinking or suggested you cut down?: Yes, during the last year Alcohol Use Disorder Identification Test Final Score (AUDIT): 20 Brief Intervention: Patient declined brief intervention Substance Abuse History in the last 12 months:  Yes.   Consequences of Substance Abuse: Medical Consequences:  seizure Legal Consequences:  2 DWI Blackouts:  Yes Withdrawal Symptoms:   Diaphoresis Tremors Vomiting Previous Psychotropic Medications: Yes Citalopram Psychological Evaluations: No  Past Medical History:  Past Medical History  Diagnosis Date  . Pancreatitis   . Alcohol abuse     Past Surgical History  Procedure Laterality Date  . None     Family History:  Family History  Problem Relation Age of Onset  . Diabetes Mother   . Hypertension Father    Family Psychiatric  History: Denies family History of mental illness or alcohol, drug abuse Tobacco Screening: @FLOW ([phone])::1)@ Social History:  History  Alcohol Use  . 42.0 oz/week  . 0 Standard drinks or equivalent, 70 Shots of liquor per week    Comment: 10 beers daily     History  Drug Use No   Lives with parents in [place]. 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, Junior Arts development officer Social History: Marital status: Single Are you sexually active?: No What is your sexual orientation?: Straight Has your sexual activity been affected by drugs, alcohol, medication, or emotional stress?: N/A Does patient have children?: No    Pain Medications: NO Prescriptions: NO Over the Counter: NO History of alcohol / drug use?:  Yes Negative Consequences of Use: Work / Youth worker, Museum/gallery curator Withdrawal Symptoms: Tremors, Irritability, DTs, Delirium Name of Substance 1: Alcohol 1 - Age of First Use: 17 1 - Amount (size/oz): a pint to a fifth of vodka  1 - Frequency: daily 1 - Last Use / Amount: 05/26/2015                  Allergies:   Allergies  Allergen Reactions  . Bee Venom Anaphylaxis and Swelling   Lab Results:  Results for orders placed or performed during the hospital encounter of 05/26/15 (from the past 48 hour(s))  Comprehensive metabolic panel     Status: Abnormal   Collection Time: 05/26/15 11:04 PM  Result Value Ref Range   Sodium 144 135 - 145 mmol/L   Potassium 4.2 3.5 - 5.1 mmol/L   Chloride 104 101 - 111 mmol/L   CO2 28 22 - 32 mmol/L   Glucose, Bld 118 (H) 65 - 99 mg/dL   BUN 6 6 - 20 mg/dL   Creatinine, Ser 0.78 0.61 - 1.24 mg/dL   Calcium 9.9 8.9 - 10.3 mg/dL   Total Protein 9.0 (  H) 6.5 - 8.1 g/dL   Albumin 4.6 3.5 - 5.0 g/dL   AST 569 (H) 15 - 41 U/L   ALT 431 (H) 17 - 63 U/L   Alkaline Phosphatase 96 38 - 126 U/L   Total Bilirubin 0.8 0.3 - 1.2 mg/dL   GFR calc non Af Amer >60 >60 mL/min   GFR calc Af Amer >60 >60 mL/min    Comment: (NOTE) The eGFR has been calculated using the CKD EPI equation. This calculation has not been validated in all clinical situations. eGFR's persistently <60 mL/min signify possible Chronic Kidney Disease.    Anion gap 12 5 - 15  Ethanol     Status: Abnormal   Collection Time: 05/26/15 11:04 PM  Result Value Ref Range   Alcohol, Ethyl (B) 355 (HH) <5 mg/dL    Comment: CRITICAL RESULT CALLED TO, READ BACK BY AND VERIFIED WITH DAVID WALKER ON 05/26/15 AT 2348 PM BY TLB        LOWEST DETECTABLE LIMIT FOR SERUM ALCOHOL IS 5 mg/dL FOR MEDICAL PURPOSES ONLY   Salicylate level     Status: None   Collection Time: 05/26/15 11:04 PM  Result Value Ref Range   Salicylate Lvl <1.7 2.8 - 30.0 mg/dL  Acetaminophen level     Status: Abnormal   Collection  Time: 05/26/15 11:04 PM  Result Value Ref Range   Acetaminophen (Tylenol), Serum <10 (L) 10 - 30 ug/mL    Comment:        THERAPEUTIC CONCENTRATIONS VARY SIGNIFICANTLY. A RANGE OF 10-30 ug/mL MAY BE AN EFFECTIVE CONCENTRATION FOR MANY PATIENTS. HOWEVER, SOME ARE BEST TREATED AT CONCENTRATIONS OUTSIDE THIS RANGE. ACETAMINOPHEN CONCENTRATIONS >150 ug/mL AT 4 HOURS AFTER INGESTION AND >50 ug/mL AT 12 HOURS AFTER INGESTION ARE OFTEN ASSOCIATED WITH TOXIC REACTIONS.   cbc     Status: Abnormal   Collection Time: 05/26/15 11:04 PM  Result Value Ref Range   WBC 8.3 3.8 - 10.6 K/uL   RBC 3.95 (L) 4.40 - 5.90 MIL/uL   Hemoglobin 13.6 13.0 - 18.0 g/dL   HCT 39.5 (L) 40.0 - 52.0 %   MCV 100.2 (H) 80.0 - 100.0 fL   MCH 34.4 (H) 26.0 - 34.0 pg   MCHC 34.4 32.0 - 36.0 g/dL   RDW 14.3 11.5 - 14.5 %   Platelets 301 150 - 440 K/uL  Urine Drug Screen, Qualitative     Status: Abnormal   Collection Time: 05/27/15  8:53 AM  Result Value Ref Range   Tricyclic, Ur Screen NONE DETECTED NONE DETECTED   Amphetamines, Ur Screen NONE DETECTED NONE DETECTED   MDMA (Ecstasy)Ur Screen NONE DETECTED NONE DETECTED   Cocaine Metabolite,Ur [place] NONE DETECTED NONE DETECTED   Opiate, Ur Screen NONE DETECTED NONE DETECTED   Phencyclidine (PCP) Ur S NONE DETECTED NONE DETECTED   Cannabinoid 50 Ng, Ur [place] NONE DETECTED NONE DETECTED   Barbiturates, Ur Screen NONE DETECTED NONE DETECTED   Benzodiazepine, Ur Scrn POSITIVE (A) NONE DETECTED   Methadone Scn, Ur NONE DETECTED NONE DETECTED    Comment: (NOTE) 408  Tricyclics, urine               Cutoff 1000 ng/mL 200  Amphetamines, urine             Cutoff 1000 ng/mL 300  MDMA (Ecstasy), urine           Cutoff 500 ng/mL 400  Cocaine Metabolite, urine       Cutoff 300 ng/mL 500  Opiate, urine                   Cutoff 300 ng/mL 600  Phencyclidine (PCP), urine      Cutoff 25 ng/mL 700  Cannabinoid, urine              Cutoff 50 ng/mL 800  Barbiturates, urine              Cutoff 200 ng/mL 900  Benzodiazepine, urine           Cutoff 200 ng/mL 1000 Methadone, urine                Cutoff 300 ng/mL 1100 1200 The urine drug screen provides only a preliminary, unconfirmed 1300 analytical test result and should not be used for non-medical 1400 purposes. Clinical consideration and professional judgment should 1500 be applied to any positive drug screen result due to possible 1600 interfering substances. A more specific alternate chemical method 1700 must be used in order to obtain a confirmed analytical result.  1800 Gas chromato graphy / mass spectrometry (GC/MS) is the preferred 1900 confirmatory method.     Blood Alcohol level:  Lab Results  Component Value Date   [facility] 355* 05/26/2015   ETH <5 38/75/6433    Metabolic Disorder Labs:  Lab Results  Component Value Date   HGBA1C 5.9* 08/14/2014   MPG 123* 08/14/2014   No results found for: PROLACTIN No results found for: CHOL, TRIG, HDL, CHOLHDL, VLDL, LDLCALC  Current Medications: Current Facility-Administered Medications  Medication Dose Route Frequency Provider Last Rate Last Dose  . acetaminophen (TYLENOL) tablet 650 mg  650 mg Oral Q6H PRN Nicholaus Bloom, MD      . alum & mag hydroxide-simeth (MAALOX/MYLANTA) 200-200-20 MG/5ML suspension 30 mL  30 mL Oral Q4H PRN Nicholaus Bloom, MD      . LORazepam (ATIVAN) tablet 1-2 mg  1-2 mg Oral Q6H Nicholaus Bloom, MD   1 mg at 05/28/15 0021  . magnesium hydroxide (MILK OF MAGNESIA) suspension 30 mL  30 mL Oral Daily PRN Nicholaus Bloom, MD      . traZODone (DESYREL) tablet 50 mg  50 mg Oral QHS PRN,MR X 1 Nicholaus Bloom, MD       PTA Medications: Prescriptions prior to admission  Medication Sig Dispense Refill Last Dose  . magnesium oxide (MAG-OX) 400 MG tablet Take 400 mg by mouth 2 (two) times daily.   unknown  . Multiple Vitamins-Minerals (MULTIVITAMIN WITH MINERALS) tablet Take 1 tablet by mouth daily.   unknown  . pantoprazole (PROTONIX) 40 MG tablet  Take 40 mg by mouth daily.   unknown  . thiamine 100 MG tablet Take 1 tablet (100 mg total) by mouth daily.   unknown    Musculoskeletal: Strength & Muscle Tone: within normal limits Gait & Station: normal Patient leans: normal  Psychiatric Specialty Exam: Physical Exam  Review of Systems  Constitutional: Negative.   HENT: Negative.   Eyes: Negative.   Respiratory: Positive for cough.        Pack a day  Cardiovascular: Negative.   Gastrointestinal: Positive for heartburn.  Genitourinary: Negative.   Musculoskeletal: Negative.   Skin: Negative.   Neurological: Positive for dizziness.  Endo/Heme/Allergies: Negative.   Psychiatric/Behavioral: Positive for depression and substance abuse. The patient is nervous/anxious and has insomnia.     Blood pressure 131/100, pulse 82, temperature 97.7 F (36.5 C), temperature source Oral, resp. rate 24, height 5' 6"  (1.676  m), weight 69.4 kg (153 lb), SpO2 100 %.Body mass index is 24.71 kg/(m^2).  General Appearance: Fairly Groomed  Engineer, water::  Fair  Speech:  Clear and Coherent  Volume:  fluctuates  Mood:  Anxious and Dysphoric  Affect:  Restricted  Thought Process:  Coherent and Goal Directed  Orientation:  Full (Time, Place, and Person)  Thought Content:  symptoms events worries concerns  Suicidal Thoughts:  No  Homicidal Thoughts:  No  Memory:  Immediate;   Fair Recent;   Fair Remote;   Fair  Judgement:  Fair  Insight:  Shallow  Psychomotor Activity:  Restlessness  Concentration:  Fair  Recall:  [employer] of Knowledge:Fair  Language: Fair  Akathisia:  No  Handed:  Right  AIMS (if indicated):     Assets:  Desire for Improvement Housing  ADL's:  Intact  Cognition: WNL  Sleep:  Number of Hours: 6     Treatment Plan Summary: Daily contact with patient to assess and evaluate symptoms and progress in treatment and Medication management Supportive approach/coping skills Get collateral information  Alcohol dependence;  Ativan detox protocol/work a relapse prevention plan Mood instability; reassess for the use of a mood stabilizer vs. an antidepressant Work with CBT/mindfulness Explore residential treatment options Observation Level/Precautions:  15 minute checks  Laboratory:  As per the ED  Psychotherapy:  Individual/group  Medications:  Will reassess for detox needs and the need for psychotropics  Consultations:    Discharge Concerns:    Estimated LOS: 3-5 days  Other:     I certify that inpatient services furnished can reasonably be expected to improve the patient's condition.    Nicholaus Bloom, MD 5/3/201711:46 AM

## 2015-05-28 NOTE — Progress Notes (Signed)
DAR NOTE: Pt present with flat affect and depressed mood in the unit. Pt has been isolating himself and has been bed most of the time. Pt denies physical pain, took all his meds as scheduled. As per self inventory, pt had a poor night sleep, good appetite, normal energy, and good concentration. Pt rate depression at 4, hopeless ness at 4, and anxiety at a 5. Pt's goal is " discharge." Pt's safety ensured with 15 minute and environmental checks. Pt currently denies SI/HI and A/V hallucinations. Pt verbally agrees to seek staff if SI/HI or A/VH occurs and to consult with staff before acting on these thoughts. Will continue POC.

## 2015-05-28 NOTE — [facility] Suicide Risk Assessment (Signed)
[facility] INPATIENT:  Family/Significant Other Suicide Prevention Education  Suicide Prevention Education:   There was no suicidal ideation at the time of admission, and SPE is not required.  Ambrose MantleMareida Grossman-Orr, LCSW 05/28/2015, 8:46 AM

## 2015-05-28 NOTE — [facility] Suicide Risk Assessment (Signed)
[facility]   Nursing information obtained from:  Patient Demographic factors:  Male, Caucasian, Low socioeconomic status, Unemployed, Access to firearms Current Mental Status:  NA Loss Factors:  Decrease in vocational status, Financial problems / change in socioeconomic status Historical Factors:  NA Risk Reduction Factors:  Living with another person, especially a relative, Positive social support  Total Time spent with patient: 45 minutes Principal Problem: Alcohol dependence (HCC) Diagnosis:   Patient Active Problem List   Diagnosis Date Noted  . Alcohol dependence (HCC) [F10.20] 05/28/2015  . Alcoholic hepatitis [K70.10] 05/27/2015  . Substance induced mood disorder (HCC) [F19.94] 05/27/2015  . Bipolar 1 disorder (HCC) [F31.9] 05/27/2015  . Delirium tremens (HCC) [F10.231] 04/03/2015  . Chronic pancreatitis (HCC) [K86.1] 04/03/2015  . Seizure (HCC) [R56.9] 04/02/2015  . 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 Rectal bleeding [K62.5] 07/31/2014  . Alcohol withdrawal (HCC) [F10.239] 07/31/2014  . Hyponatremia [E87.1] 07/31/2014  . Encephalopathy acute [G93.40] 07/31/2014  . Alcohol abuse [F10.10]    Subjective Data: see admission H and P  Continued Clinical Symptoms:  Alcohol Use Disorder Identification Test Final Score (AUDIT): 20 The "Alcohol Use Disorders Identification Test", Guidelines for Use in Primary Care, Second Edition.  World Science writerHealth Organization [facility](WHO). Score between 0-7:  no or low risk or alcohol related problems. Score between 8-15:  moderate risk of alcohol related problems. Score between 16-19:  high risk of alcohol related problems. Score 20 or above:  warrants further diagnostic evaluation for alcohol dependence and treatment.   CLINICAL FACTORS:   Bipolar Disorder:   Mixed State Alcohol/Substance Abuse/Dependencies  Psychiatric Specialty Exam: ROS  Blood pressure 131/100, pulse 82, temperature 97.7 F (36.5 C),  temperature source Oral, resp. rate 24, height 5\' 6"  (1.676 m), weight 69.4 kg (153 lb), SpO2 100 %.Body mass index is 24.71 kg/(m^2).   COGNITIVE FEATURES THAT CONTRIBUTE TO RISK:  Closed-mindedness, Polarized thinking and Thought constriction (tunnel vision)    SUICIDE RISK:   Moderate:  Frequent suicidal ideation with limited intensity, and duration, some specificity in terms of plans, no associated intent, good self-control, limited dysphoria/symptomatology, some risk factors present, and identifiable protective factors, including available and accessible social support.  PLAN OF CARE: see admission H and P                                         I certify that inpatient services furnished can reasonably be expected to improve the patient's condition.   Rachael FeeLUGO,Teleshia Lemere A, MD 05/28/2015, 4:49 PM

## 2015-05-28 NOTE — Progress Notes (Signed)
Patient attended N/A group tonight.  

## 2015-05-28 NOTE — Tx Team (Addendum)
Initial Interdisciplinary Treatment Plan   PATIENT STRESSORS: Financial difficulties Medication change or noncompliance Substance abuse   PATIENT STRENGTHS: General fund of knowledge Motivation for treatment/growth Supportive family/friends   PROBLEM LIST: Problem List/Patient Goals Date to be addressed Date deferred Reason deferred Estimated date of resolution  "I dont need help with anything, I've already detoxed" 05-28-2015     "Maybe help me learn how to cope" 05-28-2015     Substance Abuse-Alcohol 5-3-201     Increased risk for SI                                     DISCHARGE CRITERIA:  Ability to meet basic life and health needs Improved stabilization in mood, thinking, and/or behavior Verbal commitment to aftercare and medication compliance  PRELIMINARY DISCHARGE PLAN: Attend aftercare/continuing care group Attend 12-step recovery group Outpatient therapy Return to previous living arrangement  PATIENT/FAMIILY INVOLVEMENT: This treatment plan has been presented to and reviewed with the patient, Jerry Dunn.  The patient and family have been given the opportunity to ask questions and make suggestions.  Jerry Dunn 05/28/2015, 12:59 AM

## 2015-05-28 NOTE — [facility] Group Notes (Signed)
[facility] Note   05/28/2015 11:50 AM  Participation Quality:  Invited. DID NOT ATTEND> pt chose to remain in bed.  Smart, Briena Swingler LCSW

## 2015-05-28 NOTE — Tx Team (Signed)
Interdisciplinary Treatment Plan Update (Adult)  Date:  05/28/2015  Time Reviewed:  11:51 AM   Progress in Treatment: Attending groups: No. New to unit. Continuing to assess.  Participating in groups:  No. Taking medication as prescribed:  Yes. Tolerating medication:  Yes. Family/Significant othe contact made:   Patient understands diagnosis:  Yes. and As evidenced by:  seeking treatment for alcohol abuse, AVH, depression, and for medication stabilization. Pt IVCed by his mother Discussing patient identified problems/goals with staff:  Yes. Medical problems stabilized or resolved:  Yes. Denies suicidal/homicidal ideation: Yes. Issues/concerns per patient self-inventory:  Other:  Discharge Plan or Barriers: CSW assessing for appropriate referrals. He has no insurance and [place] in [place].   Reason for Continuation of Hospitalization: Depression Medication stabilization Withdrawal symptoms  Comments:  Jerry Dunn is an 35 y.o. male. Jerry Dunn arrived to the ED by way of the [facility] department. He states that his mother was upset that he was drinking, so she called the sheriff's department to come get him and bring him to the hospital. He states that he normally feels depressed, but not more depressed than normal. He denied symptoms of anxiety. He denied having auditory or visual hallucinations. He denied suicidal or homicidal ideation or intent. He reports drinking a few shots of vodka last night. He reports no major stressor or life changes that have occurred in recent months. IVC documentation states Jerry Dunn has "History of alcoholism, threatened to jump from moving vehicle". IVC paperwork was taken out by his mother.Diagnosis: Bipolar Disorder, Alcohol Abuse  Estimated length of stay:  2-5 days   New goal(s): to develop effective aftercare plan.   Additional Comments:  Patient and CSW reviewed pt's identified goals and  treatment plan. Patient verbalized understanding and agreed to treatment plan. CSW reviewed [facility] "Discharge Process and Patient Involvement" Form. Pt verbalized understanding of information provided and signed form.    Review of initial/current patient goals per problem list:  1. Goal(s): Patient will participate in aftercare plan  Met: No-goal progressing.   Target date: at discharge  As evidenced by: Patient will participate within aftercare plan AEB aftercare provider and housing plan at discharge being identified.  5/3: CSW assessing for appropriate referrals. Possibly [facility] location.   2. Goal (s): Patient will exhibit decreased depressive symptoms and suicidal ideations.  Met: No.    Target date: at discharge  As evidenced by: Patient will utilize self rating of depression at 3 or below and demonstrate decreased signs of depression or be deemed stable for discharge by MD.  5/3: Pt rates depression as high today. Denies SI/HI/AVH.   3. Goal(s): Patient will demonstrate decreased signs of withdrawal due to substance abuse  Met:No.   Target date:at discharge   As evidenced by: Patient will produce a CIWA/COWS score of 0, have stable vitals signs, and no symptoms of withdrawal.  5/3: Pt reports moderate withdrawals with CIWA score of 5 and high BP.   Attendees: Patient:   05/28/2015 11:51 AM   Family:   05/28/2015 11:51 AM   Physician:  Dr. Carlton Adam, MD 05/28/2015 11:51 AM   Nursing:   Chestine Spore RN 05/28/2015 11:51 AM   Clinical Social Worker: Maxie Better, LCSW 05/28/2015 11:51 AM   Clinical Social Worker: Erasmo Downer Drinkard LCSWA 05/28/2015 11:51 AM   Other:  Gerline Legacy Nurse Case Manager 05/28/2015 11:51 AM   Other:  Agustina Caroli NP  05/28/2015 11:51 AM   Other:   05/28/2015 11:51  AM   Other:  05/28/2015 11:51 AM   Other:  05/28/2015 11:51 AM   Other:  05/28/2015 11:51 AM    05/28/2015 11:51 AM    05/28/2015 11:51 AM    05/28/2015 11:51 AM    05/28/2015 11:51 AM     Scribe for Treatment Team:   Maxie Better, LCSW 05/28/2015 11:51 AM

## 2015-05-29 MED ORDER — LISINOPRIL 10 MG PO TABS
10.0000 mg | ORAL_TABLET | Freq: Every day | ORAL | Status: DC
  Filled 2015-05-29 (×2): qty 1
  Filled 2015-05-29: qty 7

## 2015-05-29 MED ORDER — LISINOPRIL 20 MG PO TABS
20.0000 mg | ORAL_TABLET | Freq: Once | ORAL | Status: AC
  Administered 2015-05-29: 20 mg via ORAL
  Filled 2015-05-29 (×2): qty 1

## 2015-05-29 MED ORDER — CITALOPRAM HYDROBROMIDE 20 MG PO TABS
20.0000 mg | ORAL_TABLET | Freq: Every day | ORAL | Status: DC
  Administered 2015-05-29 – 2015-05-30 (×2): 20 mg via ORAL
  Filled 2015-05-29: qty 7
  Filled 2015-05-29 (×4): qty 1

## 2015-05-29 NOTE — Progress Notes (Signed)
Patient ID: Coral ElseWilliam Fearnow, male   DOB: [birth year], 35 y.o.   MRN: [id number]   Adult Psychoeducational Group Note  Date:  05/29/2015 Time: 09:00am  Group Topic/Focus:  Overcoming Stress:   The focus of this group is to define stress and help patients assess their triggers.  Participation Level:  Did Not Attend  Participation Quality: n/a  Affect: n/a  Cognitive: n/a  Insight: n/a  Engagement in Group: n/a  Modes of Intervention:  Discussion, Education and Support  Additional Comments:  Pt chose not to attend group, pt in bed asleep.   Aurora Maskwyman, Chanah Tidmore E 05/29/2015, 9:58 AM

## 2015-05-29 NOTE — Progress Notes (Signed)
D: Pt presents blunted in affect and "neutral" in mood. Pt denies any current withdrawal symptoms. Pt presented with asymptomatic HTN. May be withdrawal related as pt presented with BAL of 355. Pt  also presented with an AST of 569 and ALT of 431 PTA.  Pt given scheduled Ativan. Pt is currently denying any SI/HI/AVH. A: Writer administered scheduled and prn medications to pt, per MD orders. Continued support and availability as needed was extended to this pt. Staff continues to monitor pt with q5915min checks.  R: No adverse drug reactions noted. Pt receptive to treatment. Pt remains safe at this time.

## 2015-05-29 NOTE — Progress Notes (Addendum)
[facility] MD Progress Note  05/29/2015 5:21 PM Jerry Dunn  MRN:  [id number] Subjective:  Jerry Dunn states that he is committed to abstain. States he understands the severity of the consequences if he was to continue to drink. He still does not think he needs a residential treatment program. He thinks he can do it on his own by going to RR meetings Principal Problem: Alcohol dependence (HCC) Diagnosis:   Patient Active Problem List   Diagnosis Date Noted  . Alcohol dependence (HCC) [F10.20] 05/28/2015  . Alcoholic hepatitis [K70.10] 05/27/2015  . Substance induced mood disorder (HCC) [F19.94] 05/27/2015  . Bipolar 1 disorder (HCC) [F31.9] 05/27/2015  . Delirium tremens (HCC) [F10.231] 04/03/2015  . Chronic pancreatitis (HCC) [K86.1] 04/03/2015  . Seizure (HCC) [R56.9] 04/02/2015  . 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 Rectal bleeding [K62.5] 07/31/2014  . Alcohol withdrawal (HCC) [F10.239] 07/31/2014  . Hyponatremia [E87.1] 07/31/2014  . Encephalopathy acute [G93.40] 07/31/2014  . Alcohol abuse [F10.10]    Total Time spent with patient: 20 minutes  Past Psychiatric History: see admission H and P  Past Medical History:  Past Medical History  Diagnosis Date  . Pancreatitis   . Alcohol abuse     Past Surgical History  Procedure Laterality Date  . None     Family History:  Family History  Problem Relation Age of Onset  . Diabetes Mother   . Hypertension Father    Family Psychiatric  History: see admission H and P Social History:  History  Alcohol Use  . 42.0 oz/week  . 0 Standard drinks or equivalent, 70 Shots of liquor per week    Comment: 10 beers daily     History  Drug Use No    Social History   Social History  . Marital Status: Single    Spouse Name: N/A  . Number of Children: N/A  . Years of Education: N/A   Occupational History  . unemployed    Social History Main Topics  . Smoking status: Current Every Day Smoker -- 1.00 packs/day for 16 years     Types: Cigarettes  . Smokeless tobacco: None  . Alcohol Use: 42.0 oz/week    0 Standard drinks or equivalent, 70 Shots of liquor per week     Comment: 10 beers daily  . Drug Use: No  . Sexual Activity: Not Currently   Other Topics Concern  . None   Social History Narrative   Lives with parents at home   Additional Social History:    Pain Medications: NO Prescriptions: NO Over the Counter: NO History of alcohol / drug use?: Yes Negative Consequences of Use: Work / Programmer, multimedia, Surveyor, quantity Withdrawal Symptoms: Tremors, Irritability, DTs, Delirium Name of Substance 1: Alcohol 1 - Age of First Use: 17 1 - Amount (size/oz): a pint to a fifth of vodka  1 - Frequency: daily 1 - Last Use / Amount: 05/26/2015                  Sleep: Fair  Appetite:  Fair  Current Medications: Current Facility-Administered Medications  Medication Dose Route Frequency Provider Last Rate Last Dose  . acetaminophen (TYLENOL) tablet 650 mg  650 mg Oral Q6H PRN Rachael Fee, MD      . alum & mag hydroxide-simeth (MAALOX/MYLANTA) 200-200-20 MG/5ML suspension 30 mL  30 mL Oral Q4H PRN Rachael Fee, MD      . citalopram (CELEXA) tablet 20 mg  20 mg Oral Daily Rachael Fee, MD  20 mg at 05/29/15 1210  . hydrOXYzine (ATARAX/VISTARIL) tablet 25 mg  25 mg Oral Q6H PRN Rachael FeeIrving A Nainoa Woldt, MD   25 mg at 05/28/15 2153  . [START ON 05/30/2015] lisinopril (PRINIVIL,ZESTRIL) tablet 10 mg  10 mg Oral Daily Sanjuana KavaAgnes I Nwoko, NP      . loperamide (IMODIUM) capsule 2-4 mg  2-4 mg Oral PRN Rachael FeeIrving A Marckus Hanover, MD      . LORazepam (ATIVAN) tablet 1 mg  1 mg Oral Q6H PRN Rachael FeeIrving A Pleas Carneal, MD      . LORazepam (ATIVAN) tablet 1 mg  1 mg Oral QID Rachael FeeIrving A Carrye Goller, MD   1 mg at 05/29/15 1720   Followed by  . [START ON 05/30/2015] LORazepam (ATIVAN) tablet 1 mg  1 mg Oral TID Rachael FeeIrving A Taima Rada, MD       Followed by  . [START ON 05/31/2015] LORazepam (ATIVAN) tablet 1 mg  1 mg Oral BID Rachael FeeIrving A Venera Privott, MD       Followed by  . [START ON 06/01/2015]  LORazepam (ATIVAN) tablet 1 mg  1 mg Oral Daily Rachael FeeIrving A Raeford Brandenburg, MD      . magnesium hydroxide (MILK OF MAGNESIA) suspension 30 mL  30 mL Oral Daily PRN Rachael FeeIrving A Carrington Olazabal, MD      . multivitamin with minerals tablet 1 tablet  1 tablet Oral Daily Rachael FeeIrving A Toshika Parrow, MD   1 tablet at 05/29/15 [phone]  . ondansetron (ZOFRAN-ODT) disintegrating tablet 4 mg  4 mg Oral Q6H PRN Rachael FeeIrving A Sabriah Hobbins, MD      . thiamine (VITAMIN B-1) tablet 100 mg  100 mg Oral Daily Rachael FeeIrving A Khaleesi Gruel, MD   100 mg at 05/29/15 0846  . traZODone (DESYREL) tablet 50 mg  50 mg Oral QHS PRN,MR X 1 Rachael FeeIrving A Kimaya Whitlatch, MD        Lab Results: No results found for this or any previous visit (from the past 48 hour(s)).  Blood Alcohol level:  Lab Results  Component Value Date   [facility] 05/26/2015   ETH <5 04/02/2015    Physical Findings: AIMS: Facial and Oral Movements Muscles of Facial Expression: None, normal Lips and Perioral Area: None, normal Jaw: None, normal Tongue: None, normal,Extremity Movements Upper (arms, wrists, hands, fingers): None, normal Lower (legs, knees, ankles, toes): None, normal, Trunk Movements Neck, shoulders, hips: None, normal, Overall Severity Severity of abnormal movements (highest score from questions above): None, normal Incapacitation due to abnormal movements: None, normal Patient's awareness of abnormal movements (rate only patient's report): No Awareness, Dental Status Current problems with teeth and/or dentures?: No Does patient usually wear dentures?: No  CIWA:  CIWA-Ar Total: 2 COWS:  COWS Total Score: 3  Musculoskeletal: Strength & Muscle Tone: within normal limits Gait & Station: normal Patient leans: normal  Psychiatric Specialty Exam: Review of Systems  Constitutional: Negative.   HENT: Negative.   Eyes: Negative.   Respiratory: Negative.   Cardiovascular: Negative.   Gastrointestinal: Negative.   Genitourinary: Negative.   Musculoskeletal: Negative.   Skin: Negative.   Neurological:  Negative.   Endo/Heme/Allergies: Negative.   Psychiatric/Behavioral: Positive for depression and substance abuse. The patient is nervous/anxious.     Blood pressure 140/103, pulse 65, temperature 97.7 F (36.5 C), temperature source Oral, resp. rate 18, height 5\' 6"  (1.676 m), weight 69.4 kg (153 lb), SpO2 100 %.Body mass index is 24.71 kg/(m^2).  General Appearance: Fairly Groomed  Patent attorneyye Contact::  Fair  Speech:  Clear and Coherent  Volume:  Decreased  Mood:  Anxious and Depressed  Affect:  Restricted  Thought Process:  Coherent and Goal Directed  Orientation:  Full (Time, Place, and Person)  Thought Content:  symptoms events worries concerns  Suicidal Thoughts:  No  Homicidal Thoughts:  No  Memory:  Immediate;   Fair Recent;   Fair Remote;   Fair  Judgement:  Fair  Insight:  Shallow  Psychomotor Activity:  Restlessness  Concentration:  Fair  Recall:  [employer] of Knowledge:Fair  Language: Fair  Akathisia:  No  Handed:  Right  AIMS (if indicated):     Assets:  Desire for Improvement Housing  ADL's:  Intact  Cognition: WNL  Sleep:  Number of Hours: 6.75   Treatment Plan Summary: Daily contact with patient to assess and evaluate symptoms and progress in treatment and Medication management Supportive approach/coping skills Alcohol dependence; continue the Ativan detox protocol Depression; continue the Celexa 20 mg daily Continue to encourage to consider a residential treatment program Work a relapse prevention plan  Breck Maryland A, MD 05/29/2015, 5:21 PM

## 2015-05-29 NOTE — [facility] Group Notes (Signed)
[facility] LCSW Group Therapy  05/29/2015 4:34 PM  Type of Therapy:  Group Therapy  Participation Level:  Minimal  Participation Quality:  Attentive  Affect:  Appropriate  Cognitive:  Alert  Insight:  Improving  Engagement in Therapy:  Improving  Modes of Intervention:  Confrontation, Discussion, Education, Exploration, Problem-solving, Rapport Building, Socialization and Support  Summary of Progress/Problems: Today's Topic: Overcoming Obstacles. Patients identified one short term goal and potential obstacles in reaching this goal. Patients processed barriers involved in overcoming these obstacles. Patients identified steps necessary for overcoming these obstacles and explored motivation (internal and external) for facing these difficulties head on. Chrissie NoaWilliam was attentive during group with minimal active participation. He shared that his biggest obstacle is remaining sober and getting out of the hospital. He reports that his father and mother will take him back and is hoping to d/c tomorrow. "[facility] [place] [facility] [place]   Smart, ConcordiaHeather LCSW 05/29/2015, 4:34 PM

## 2015-05-29 NOTE — Progress Notes (Signed)
Patient ID: Jerry Dunn, male   DOB: [birth year], 35 y.o.   MRN: [id number]   Pt currently presents with a flat affect and irritable behavior. Per self inventory, pt rates depression, hopelessness and anxiety at a 2. Pt's daily goal is to "discharge and look into outpatient therapy" and they intend to do so by "ask for guidance." Pt reports good sleep, a good appetite, normal energy and good concentration. Pt denies any signs and symptoms of withdrawal, writes "not withdrawing, I feel great physically." Pt eyes are reddened, avoids eye contact, pupils are dilated and hypertensive.   Pt provided with medications per providers orders. Pt's labs and vitals were monitored throughout the day. Pt supported emotionally and encouraged to express concerns and questions. Pt educated on medications.  Pt's safety ensured with 15 minute and environmental checks. Pt currently denies SI/HI and A/V hallucinations. Pt verbally agrees to seek staff if SI/HI or A/VH occurs and to consult with staff before acting on these thoughts. Will continue POC.

## 2015-05-29 NOTE — Progress Notes (Signed)
[facility] Group Notes:  (Nursing/MHT/Case Management/Adjunct)  Date:  05/29/2015  Time:  2100   Type of Therapy:  wrap up group   Participation Level:  Active  Participation Quality:  Appropriate, Attentive, Sharing and Supportive  Affect:  Flat  Cognitive:  Appropriate  Insight:  Improving  Engagement in Group:  Engaged  Modes of Intervention:  Clarification, Education and Support  Summary of Progress/Problems:  Pt shared that he had been in the hospital six times in the last year, 2 for detox and 4 for life saving measures.  Pt plans on pursuing cognitive therapy and an antidepressant after discharge.. Pt shared that he slept well last night.   Johann CapersMcNeil, Winfield Caba S 05/29/2015, 10:08 PM

## 2015-05-30 DIAGNOSIS — F10230 Alcohol dependence with withdrawal, uncomplicated: Secondary | ICD-10-CM | POA: Insufficient documentation

## 2015-05-30 MED ORDER — LISINOPRIL 5 MG PO TABS: 5.0000 mg | ORAL_TABLET | Freq: Every day | ORAL | Status: DC

## 2015-05-30 MED ORDER — MULTI-VITAMIN/MINERALS PO TABS: 1.0000 | ORAL_TABLET | Freq: Every day | ORAL | Status: AC

## 2015-05-30 MED ORDER — TRAZODONE HCL 50 MG PO TABS: ORAL_TABLET | ORAL | Status: DC

## 2015-05-30 MED ORDER — HYDROXYZINE HCL 25 MG PO TABS: ORAL_TABLET | ORAL | Status: AC

## 2015-05-30 MED ORDER — LISINOPRIL 5 MG PO TABS: 10.0000 mg | ORAL_TABLET | Freq: Every day | ORAL | Status: DC

## 2015-05-30 MED ORDER — LISINOPRIL 5 MG PO TABS
5.0000 mg | ORAL_TABLET | Freq: Every day | ORAL | Status: DC
  Filled 2015-05-30: qty 7

## 2015-05-30 MED ORDER — CITALOPRAM HYDROBROMIDE 20 MG PO TABS: 20.0000 mg | ORAL_TABLET | Freq: Every day | ORAL | Status: AC

## 2015-05-30 NOTE — Progress Notes (Signed)
D: Pt denies any current withdrawal symptoms. Pt presents guarded. Pt reports a plan to attend meetings with [facility]. Pt also verbalizes a plan to attend outpatient services upon discharge. Pt is currently denying any SI/HI/AVH. Pt is visible within the milieu but with minimal interactions with others.  A: Writer administered scheduled medications to pt, per MD orders. Continued support and availability as needed was extended to this pt. Staff continues to monitor pt with q7815min checks.   R: No adverse drug reactions noted. Pt receptive to treatment. Pt remains safe at this time.

## 2015-05-30 NOTE — Progress Notes (Signed)
Patient ID: Jerry ElseWilliam Yanni, male   DOB: 1980/06/27, 35 y.o.   MRN: [id number]   Pt currently presents with a flat affect and ambivalent behavior. Per self inventory, pt rates depression at a 1, hopelessness 1 and anxiety 0. Pt's daily goal is to "discharge and aftercare follow-up." Pt reports good sleep, a good appetite, normal energy and good concentration.   Pt provided with medications per providers orders. Pt's labs and vitals were monitored throughout the day. Pt supported emotionally and encouraged to express concerns and questions. Pt educated on medications and suicide prevention resources. Reported hypotension to provider for assessment. Educated patient on signs of hypotension, orthostatic hypotension and fall risk.  Pt's safety ensured with 15 minute and environmental checks. Pt currently denies SI/HI and A/V hallucinations. Pt verbally agrees to seek staff if SI/HI or A/VH occurs and to consult with staff before acting on these thoughts. Pt to be discharged per MD orders. Will continue POC.

## 2015-05-30 NOTE — Plan of Care (Signed)
Problem: Alteration in mood; excessive anxiety as evidenced by: Goal: STG-Pt will report an absence of self-harm thoughts/actions (Patient will report an absence of self-harm thoughts or actions)  Outcome: Progressing Pt denies any suicidal ideation     

## 2015-05-30 NOTE — Tx Team (Signed)
Interdisciplinary Treatment Plan Update (Adult)  Date:  05/30/2015  Time Reviewed:  10:47 AM   Progress in Treatment: Attending groups: Yes Participating in groups:  Minimally when he attends  Taking medication as prescribed:  Yes. Tolerating medication:  Yes. Family/Significant othe contact made:  SPE not required for this pt.  Patient understands diagnosis:  Yes. and As evidenced by:  seeking treatment for alcohol abuse, AVH, depression, and for medication stabilization. Pt IVCed by his mother Discussing patient identified problems/goals with staff:  Yes. Medical problems stabilized or resolved:  Yes. Denies suicidal/homicidal ideation: Yes. Issues/concerns per patient self-inventory:  Other:  Discharge Plan or Barriers: Pt plans to return home and agreeable to follow-up at [facility]. [facility] in [place]. Also, pt provided with AA list.   Reason for Continuation of Hospitalization: none  Comments:  Jerry Dunn is an 35 y.o. male. Jerry Dunn arrived to the ED by way of the [facility] department. He states that his mother was upset that he was drinking, so she called the sheriff's department to come get him and bring him to the hospital. He states that he normally feels depressed, but not more depressed than normal. He denied symptoms of anxiety. He denied having auditory or visual hallucinations. He denied suicidal or homicidal ideation or intent. He reports drinking a few shots of vodka last night. He reports no major stressor or life changes that have occurred in recent months. IVC documentation states Jerry Dunn has "History of alcoholism, threatened to jump from moving vehicle". IVC paperwork was taken out by his mother.Diagnosis: Bipolar Disorder, Alcohol Abuse  Estimated length of stay:  D/c today   Additional Comments:  Patient and CSW reviewed pt's identified goals and treatment plan. Patient verbalized understanding and agreed to treatment plan. CSW  reviewed [facility] "Discharge Process and Patient Involvement" Form. Pt verbalized understanding of information provided and signed form.    Review of initial/current patient goals per problem list:  1. Goal(s): Patient will participate in aftercare plan  Met:Yes  Target date: at discharge  As evidenced by: Patient will participate within aftercare plan AEB aftercare provider and housing plan at discharge being identified.  5/3: CSW assessing for appropriate referrals. Possibly [facility] location.   5/5: Pt to return home; follow-up at [facility] [place] in [place]  2. Goal (s): Patient will exhibit decreased depressive symptoms and suicidal ideations.  Met:Yes.    Target date: at discharge  As evidenced by: Patient will utilize self rating of depression at 3 or below and demonstrate decreased signs of depression or be deemed stable for discharge by MD.  5/3: Pt rates depression as high today. Denies SI/HI/AVH.   5/5: Pt rates depression as low. Denies SI/HI/AVH.   3. Goal(s): Patient will demonstrate decreased signs of withdrawal due to substance abuse  Met:Yes  Target date:at discharge   As evidenced by: Patient will produce a CIWA/COWS score of 0, have stable vitals signs, and no symptoms of withdrawal.  5/3: Pt reports moderate withdrawals with CIWA score of 5 and high BP.   5/5: Pt reports no signs of withdrawal with CIWA of 1 and stable vitals. Per MD, pt is medically stable for discharge today.  Attendees: Patient:   05/30/2015 10:47 AM   Family:   05/30/2015 10:47 AM   Physician:  Dr. Carlton Adam, MD 05/30/2015 10:47 AM   Nursing:   Gus Height RN 05/30/2015 10:47 AM   Clinical Social Worker: Maxie Better, LCSW 05/30/2015 10:47  AM   Clinical Social Worker: Erasmo Downer Drinkard LCSWA 05/30/2015 10:47 AM   Other:  Gerline Legacy Nurse Case Manager 05/30/2015 10:47 AM   Other:  Agustina Caroli NP  05/30/2015 10:47 AM   Other:   05/30/2015 10:47 AM    Other:  05/30/2015 10:47 AM   Other:  05/30/2015 10:47 AM   Other:  05/30/2015 10:47 AM    05/30/2015 10:47 AM    05/30/2015 10:47 AM    05/30/2015 10:47 AM    05/30/2015 10:47 AM    Scribe for Treatment Team:   Maxie Better, LCSW 05/30/2015 10:47 AM

## 2015-05-30 NOTE — Progress Notes (Signed)
  [facility] :  Will you be returning to the same living situation after discharge:  Yes,  home At discharge, do you have transportation home?: Yes,  parent Do you have the ability to pay for your medications: Yes,  mental health  Release of information consent forms completed and submitted to medical records by CSW.  Patient to Follow up at: Follow-up Information    [facility]   Why:  Walk in Monday, Wednesday, Friday between 8am-3pm (first come first serve) for assessment of mental health services (including Medication management, counseling, substance abuse).    Contact information:   [address] [place] [address] [phone]       Next level of care provider has access to [facility] Link:no  Safety Planning and Suicide Prevention discussed:n/a: SPE not required as pt was not SI/HI during admission or during stay. SPI pamphlet and Mobile Crisis information provided to the pt and he was encouraged to share this with is family/support network.   Have you used any form of tobacco in the last 30 days? (Cigarettes, Smokeless Tobacco, Cigars, and/or Pipes): Yes  Has patient been referred to the Quitline?: Patient refused referral  Patient has been referred for addiction treatment: Yes  Smart, Shontez Sermon LCSW 05/30/2015, 10:45 AM

## 2015-05-30 NOTE — [facility] Suicide Risk Assessment (Signed)
[facility]   Principal Problem: Alcohol dependence [facility](HCC) Discharge Diagnoses:  Patient Active Problem List   Diagnosis Date Noted  . Alcohol dependence (HCC) [F10.20] 05/28/2015  . Alcoholic hepatitis [K70.10] 05/27/2015  . Substance induced mood disorder (HCC) [F19.94] 05/27/2015  . Bipolar 1 disorder (HCC) [F31.9] 05/27/2015  . Delirium tremens (HCC) [F10.231] 04/03/2015  . Chronic pancreatitis (HCC) [K86.1] 04/03/2015  . Seizure (HCC) [R56.9] 04/02/2015  . 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 Rectal bleeding [K62.5] 07/31/2014  . Alcohol withdrawal (HCC) [F10.239] 07/31/2014  . Hyponatremia [E87.1] 07/31/2014  . Encephalopathy acute [G93.40] 07/31/2014  . Alcohol abuse [F10.10]     Total Time spent with patient: 20 minutes  Musculoskeletal: Strength & Muscle Tone: within normal limits Gait & Station: normal Patient leans: Right  Psychiatric Specialty Exam: Review of Systems  Constitutional: Negative.   HENT: Negative.   Respiratory: Positive for cough.   Cardiovascular: Negative.   Gastrointestinal: Negative.   Genitourinary: Negative.   Musculoskeletal: Negative.   Skin: Negative.   Neurological: Negative.   Endo/Heme/Allergies: Negative.   Psychiatric/Behavioral: Positive for substance abuse.    Blood pressure 89/57, pulse 100, temperature 97.8 F (36.6 C), temperature source Oral, resp. rate 16, height 5\' 6"  (1.676 m), weight 69.4 kg (153 lb), SpO2 100 %.Body mass index is 24.71 kg/(m^2).  General Appearance: Fairly Groomed  Patent attorneyye Contact::  Fair  Speech:  Clear and Coherent409  Volume:  Normal  Mood:  Euthymic  Affect:  Appropriate  Thought Process:  Coherent and Goal Directed  Orientation:  Full (Time, Place, and Person)  Thought Content:  plans as he moves on relapse prevention plan  Suicidal Thoughts:  No  Homicidal Thoughts:  No  Memory:  Immediate;   Fair Recent;   Fair Remote;   Fair  Judgement:  Fair  Insight:   Present  Psychomotor Activity:  Normal  Concentration:  Fair  Recall:  FiservFair  Fund of Knowledge:Fair  Language: Fair  Akathisia:  No  Handed:  Right  AIMS (if indicated):     Assets:  Desire for Improvement Housing  Sleep:  Number of Hours: 6.75  Cognition: WNL  ADL's:  Intact  In full contact with reality. There are no active S/S of withdrawal. There are no active SI plans or intent. Willing and motivated to pursue outpatient therapy Mental Status Per Nursing Assessment::   On Admission:  NA  Demographic Factors:  Male and Caucasian  Loss Factors: Legal issues  Historical Factors: denies  Risk Reduction Factors:   Sense of responsibility to family, Living with another person, especially a relative and Positive social support  Continued Clinical Symptoms:  Alcohol/Substance Abuse/Dependencies  Cognitive Features That Contribute To Risk:  None    Suicide Risk:  Minimal: No identifiable suicidal ideation.  Patients presenting with no risk factors but with morbid ruminations; may be classified as minimal risk based on the severity of the depressive symptoms  Follow-up Information    [facility] with [facility].   Why:  Walk in Monday, Wednesday, Friday between 8am-3pm (first come first serve) for assessment of mental health services (including Medication management, counseling, substance abuse).    Contact information:   [address] [place] [address] [phone]       Plan Of Care/Follow-up recommendations:  Activity:  as tolerated Diet:  regular Follow up as above Lace Chenevert A, MD 05/30/2015, 10:52 AM

## 2015-05-30 NOTE — Discharge Summary (Signed)
Physician Discharge Summary Note  Patient:  Jerry Dunn is an 35 y.o., male MRN:  [id number] DOB:  06-Jul-1980 Patient phone:  [phone] (home)  Patient address:   [address] [place] [address],  Total Time spent with patient: 30 minutes  Date of Admission:  05/27/2015  Date of Discharge: 05-30-15  Reason for Admission:   Principal Problem: Alcohol dependence [facility](HCC)  Discharge Diagnoses: Patient Active Problem List   Diagnosis Date Noted  . Alcohol dependence (HCC) [F10.20] 05/28/2015  . Alcoholic hepatitis [K70.10] 05/27/2015  . Substance induced mood disorder (HCC) [F19.94] 05/27/2015  . Bipolar 1 disorder (HCC) [F31.9] 05/27/2015  . Delirium tremens (HCC) [F10.231] 04/03/2015  . Chronic pancreatitis (HCC) [K86.1] 04/03/2015  . Seizure (HCC) [R56.9] 04/02/2015  . 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 Rectal bleeding [K62.5] 07/31/2014  . Alcohol withdrawal (HCC) [F10.239] 07/31/2014  . Hyponatremia [E87.1] 07/31/2014  . Encephalopathy acute [G93.40] 07/31/2014  . Alcohol abuse [F10.10]    Past Psychiatric History: Bipolar disorder, Alcohol dependence  Past Medical History:  Past Medical History  Diagnosis Date  . Pancreatitis   . Alcohol abuse     Past Surgical History  Procedure Laterality Date  . None     Family History:  Family History  Problem Relation Age of Onset  . Diabetes Mother   . Hypertension Father    Family Psychiatric  History: See H&P Social History:  History  Alcohol Use  . 42.0 oz/week  . 0 Standard drinks or equivalent, 70 Shots of liquor per week    Comment: 10 beers daily     History  Drug Use No    Social History   Social History  . Marital Status: Single    Spouse Name: N/A  . Number of Children: N/A  . Years of Education: N/A   Occupational History  . unemployed    Social History Main Topics  . Smoking status: Current Every Day Smoker -- 1.00 packs/day for 16 years    Types: Cigarettes  .  Smokeless tobacco: None  . Alcohol Use: 42.0 oz/week    0 Standard drinks or equivalent, 70 Shots of liquor per week     Comment: 10 beers daily  . Drug Use: No  . Sexual Activity: Not Currently   Other Topics Concern  . None   Social History Narrative   Lives with parents at home   Hospital Course: 35 Y/O male who states that his mother is very anti alcohol she called the deputies when she saw him drinking. Had a seizure in March after he tried to quit cold [place]. Went back to drinking. States he is dealing with certain stressors: financial due to medical bills, legal issues probation for DWI 2015 convicted 2016 April. Had one in 2008. Drinking varies a lot. Tries to get 24 ounces of beer. Sometimes gets Vodka to go to sleep. Admits to hearing voices that talk among each other but states he has not heard recently.  Chrissie NoaWilliam was admitted to the hospital with his UDS reports showing positive Benzodiazepine. His BAL was 355 per toxicology tests results. He admits having been drinking a lot & it has worsened. He was here for alcohol/drug detox. He reported his stressors related to financial problems & legal issues (DWI). He has hx of alcohol related seizures. He was in need of alcohol detoxification treatments. Makail's recent lab reports indicated elevated liver enzymes (AST, ALT), possibly from chronic alcoholism. As a result, not a candidate for Librium detoxification treatment protocols.  This is because Librium is a long acting Benzodiazepine, not suitable for a compromised liver enzymes. His detoxification treatment was achieved using Ativan detox regimen on a tapering dose format. By using Ativan detox regimen, Chrissie Noa received a cleaner detoxification treatment without the lingering adverse effects of the Librium capsules. He was enrolled in the group counseling sessions, AA/NA meetings being offered and held on this unit. He participated and learned coping skills. He tolerated his treatment  regimen without any significant adverse effects and or reactions.  Besides the detoxification treatments, Letcher was also medicated & discharged on; Trazodone 50 mg for insomnia, Citalopram 20 mg for depression & Hydroxyzine 25 mg prn for anxiety. He also received other medication regimen for the other medical issues reported. Abelino has completed detox treatment and his mood is stable. This is evidenced by his reports of improved mood &  Absence of substance withdrawal symptoms. He will resume psychiatric care & routine medication management at the [facility] clinic here in [place], [place]. He was encouraged to join/attend AA/NA meetings being offered and held within his community to achieve & maintain maximum sobriety.   Upon discharge, Cindy adamantly denies any suicidal, homicidal ideations, auditory, visual hallucinations, delusional thoughts, paranoia & or substance withdrawal symptoms. He left [facility] with all personal belongings in no apparent distress. He received a 7 days worth supply samples of his [facility] discharge medications provided by [facility] pharmacy. Transportation per parents.  Physical Findings: AIMS: Facial and Oral Movements Muscles of Facial Expression: None, normal Lips and Perioral Area: None, normal Jaw: None, normal Tongue: None, normal,Extremity Movements Upper (arms, wrists, hands, fingers): None, normal Lower (legs, knees, ankles, toes): None, normal, Trunk Movements Neck, shoulders, hips: None, normal, Overall Severity Severity of abnormal movements (highest score from questions above): None, normal Incapacitation due to abnormal movements: None, normal Patient's awareness of abnormal movements (rate only patient's report): No Awareness, Dental Status Current problems with teeth and/or dentures?: No Does patient usually wear dentures?: No  CIWA:  CIWA-Ar Total: 1 COWS:  COWS Total Score: 3  Musculoskeletal: Strength & Muscle Tone: within normal limits Gait & Station:  normal Patient leans: N/A  Psychiatric Specialty Exam: Review of Systems  Constitutional: Negative.   HENT: Negative.   Eyes: Negative.   Respiratory: Negative.   Cardiovascular: Negative.   Gastrointestinal: Negative.   Genitourinary: Negative.   Musculoskeletal: Negative.   Skin: Negative.   Neurological: Negative.   Endo/Heme/Allergies: Negative.   Psychiatric/Behavioral: Positive for depression (Stable) and substance abuse (Alcoholism, chronic). Negative for suicidal ideas, hallucinations and memory loss. The patient has insomnia (Stable). The patient is not nervous/anxious.     Blood pressure 89/57, pulse 100, temperature 97.8 F (36.6 C), temperature source Oral, resp. rate 16, height  (1.676 m), weight 69.4 kg (153 lb), SpO2 100 %.Body mass index is 24.71 kg/(m^2).  See Md's SRA   Have you used any form of tobacco in the last 30 days? (Cigarettes, Smokeless Tobacco, Cigars, and/or Pipes): Yes  Has this patient used any form of tobacco in the last 30 days? (Cigarettes, Smokeless Tobacco, Cigars, and/or Pipes): No  Blood Alcohol level:  Lab Results  Component Value Date   [facility] 355* 05/26/2015   ETH <5 04/02/2015    Metabolic Disorder Labs:  Lab Results  Component Value Date   HGBA1C 5.9* 08/14/2014   MPG 123* 08/14/2014   No results found for: PROLACTIN No results found for: CHOL, TRIG, HDL, CHOLHDL, VLDL, LDLCALC  See Psychiatric Specialty Exam and Suicide Risk  Assessment completed by Attending Physician prior to discharge.  Discharge destination:  Home  Is patient on multiple antipsychotic therapies at discharge:  No   Has Patient had three or more failed trials of antipsychotic monotherapy by history:  No  Recommended Plan for Multiple Antipsychotic Therapies: NA    Medication List    STOP taking these medications        magnesium oxide 400 MG tablet  Commonly known as:  MAG-OX     pantoprazole 40 MG tablet  Commonly known as:  PROTONIX      thiamine 100 MG tablet      TAKE these medications      Indication   citalopram 20 MG tablet  Commonly known as:  CELEXA  Take 1 tablet (20 mg total) by mouth daily. For depression   Indication:  Depression     hydrOXYzine 25 MG tablet  Commonly known as:  ATARAX/VISTARIL  Take 1 tablet (25 mg) four times daily as needed: For anxiety   Indication:  Anxiety     lisinopril 5 MG tablet  Commonly known as:  PRINIVIL,ZESTRIL  Take 2 tablets (10 mg total) by mouth daily. For HTN   Indication:  High Blood Pressure     multivitamin with minerals tablet  Take 1 tablet by mouth daily. For low Vitamin   Indication:  Vitamin supplement     traZODone 50 MG tablet  Commonly known as:  DESYREL  Take 1 tablet (100 mg) at bedtime: For sleep   Indication:  Trouble Sleeping       Follow-up Information    Follow up with [facility].   Why:  Walk in Monday, Wednesday, Friday between 8am-3pm (first come first serve) for assessment of mental health services (including Medication management, counseling, substance abuse).    Contact information:   [address] [place] [address] [phone]      Follow-up recommendations: Activity:  As tolerated Diet: As recommended by your primary care doctor. Keep all scheduled follow-up appointments as recommended.    Comments: Take all your medications as prescribed by your mental healthcare provider. Report any adverse effects and or reactions from your medicines to your outpatient provider promptly. Patient is instructed and cautioned to not engage in alcohol and or illegal drug use while on prescription medicines. In the event of worsening symptoms, patient is instructed to call the crisis hotline, 911 and or go to the nearest ED for appropriate evaluation and treatment of symptoms. Follow-up with your primary care provider for your other medical issues, concerns and or health care needs.   Signed: Sanjuana Kava, NP,  PMHNP-BC 05/30/2015, 10:43 AM  I personally assessed the patient and formulated the plan Madie Reno A. Dub Mikes, M.D.

## 2015-05-30 NOTE — Progress Notes (Signed)
Patient ID: Jerry ElseWilliam Dunn, male   DOB: [birth year], 35 y.o.   MRN: [id number]   Pt discharged to lobby. Pt was stable and appreciative at that time. All papers and prescriptions were given and valuables returned. Verbal understanding expressed. Denies SI/HI and A/VH. Pt given opportunity to express concerns and ask questions.

## 2015-05-30 NOTE — Progress Notes (Signed)
Recreation Therapy Notes  Date: 05.05.2017 Time: 9:30am Location: 30 Hall Group Room   Group Topic: Stress Management  Goal Area(s) Addresses:  Patient will actively participate in stress management techniques presented during session.   Behavioral Response: Did not attend   Skarlette Lattner L Allyne Hebert, LRT/CTRS        Thorsten Climer L 05/30/2015 9:59 AM 

## 2015-06-01 ENCOUNTER — Encounter: Payer: Self-pay | Admitting: *Deleted

## 2015-06-01 ENCOUNTER — Emergency Department
Admission: EM | Admit: 2015-06-01 | Discharge: 2015-06-02 | Disposition: A | Discharge status: Home / Self Care | Payer: Self-pay | Attending: Emergency Medicine | Admitting: Emergency Medicine

## 2015-06-01 DIAGNOSIS — F329 Major depressive disorder, single episode, unspecified: Secondary | ICD-10-CM | POA: Insufficient documentation

## 2015-06-01 DIAGNOSIS — R45851 Suicidal ideations: Secondary | ICD-10-CM

## 2015-06-01 DIAGNOSIS — F1010 Alcohol abuse, uncomplicated: Secondary | ICD-10-CM

## 2015-06-01 DIAGNOSIS — F32A Depression, unspecified: Secondary | ICD-10-CM

## 2015-06-01 DIAGNOSIS — F17210 Nicotine dependence, cigarettes, uncomplicated: Secondary | ICD-10-CM | POA: Insufficient documentation

## 2015-06-01 DIAGNOSIS — F10220 Alcohol dependence with intoxication, uncomplicated: Secondary | ICD-10-CM | POA: Insufficient documentation

## 2015-06-01 DIAGNOSIS — Z79899 Other long term (current) drug therapy: Secondary | ICD-10-CM | POA: Insufficient documentation

## 2015-06-01 DIAGNOSIS — F10920 Alcohol use, unspecified with intoxication, uncomplicated: Secondary | ICD-10-CM

## 2015-06-01 DIAGNOSIS — F1994 Other psychoactive substance use, unspecified with psychoactive substance-induced mood disorder: Secondary | ICD-10-CM | POA: Diagnosis present

## 2015-06-01 DIAGNOSIS — F1020 Alcohol dependence, uncomplicated: Secondary | ICD-10-CM | POA: Diagnosis present

## 2015-06-01 HISTORY — DX: Alcohol use, unspecified with withdrawal delirium: F10.931

## 2015-06-01 HISTORY — DX: Alcohol dependence with withdrawal delirium: F10.231

## 2015-06-01 HISTORY — DX: Bipolar disorder, unspecified: F31.9

## 2015-06-01 LAB — COMPREHENSIVE METABOLIC PANEL
ALK PHOS: 75 U/L (ref 38–126)
ALT: 299 U/L — AB (ref 17–63)
ANION GAP: 15 (ref 5–15)
AST: 276 U/L — ABNORMAL HIGH (ref 15–41)
Albumin: 4.7 g/dL (ref 3.5–5.0)
BILIRUBIN TOTAL: 0.6 mg/dL (ref 0.3–1.2)
BUN: 9 mg/dL (ref 6–20)
CALCIUM: 9.3 mg/dL (ref 8.9–10.3)
CO2: 24 mmol/L (ref 22–32)
CREATININE: 0.97 mg/dL (ref 0.61–1.24)
Chloride: 100 mmol/L — ABNORMAL LOW (ref 101–111)
GFR calc non Af Amer: >60 mL/min (ref >60)
Glucose, Bld: 92 mg/dL (ref 65–99)
Potassium: 4.2 mmol/L (ref 3.5–5.1)
SODIUM: 139 mmol/L (ref 135–145)
TOTAL PROTEIN: 9.1 g/dL — AB (ref 6.5–8.1)

## 2015-06-01 LAB — CBC
HEMATOCRIT: 40.7 % (ref 40.0–52.0)
HEMOGLOBIN: 13.9 g/dL (ref 13.0–18.0)
MCH: 34.3 pg — AB (ref 26.0–34.0)
MCHC: 34.1 g/dL (ref 32.0–36.0)
MCV: 100.7 fL — AB (ref 80.0–100.0)
Platelets: 314 10*3/uL (ref 150–440)
RBC: 4.04 MIL/uL — AB (ref 4.40–5.90)
RDW: 13.8 % (ref 11.5–14.5)
WBC: 10.6 10*3/uL (ref 3.8–10.6)

## 2015-06-01 LAB — ETHANOL: Alcohol, Ethyl (B): 383 mg/dL (ref <5)

## 2015-06-01 NOTE — ED Provider Notes (Signed)
[facility] Emergency Department Provider Note  ____________________________________________  Time seen: Approximately 7:17 PM  I have reviewed the triage vital signs and the nursing notes.   HISTORY  Chief Complaint Suicidal    HPI Jerry Dunn is a 35 y.o. male with a history of alcohol dependence brought by police for suicidal ideations. Per report, the patient was threatening suicide to his family members in the setting of intoxication. The patient states that he had 3 shots of vodka today, but denies that he threatened suicide. He denies any SI, HI or hallucinations. He denies any medical complaints today.   Past Medical History  Diagnosis Date  . Pancreatitis   . Alcohol abuse     Patient Active Problem List   Diagnosis Date Noted  . Alcohol dependence with uncomplicated withdrawal (HCC)   . Alcohol dependence (HCC) 05/28/2015  . Alcoholic hepatitis 05/27/2015  . Substance induced mood disorder (HCC) 05/27/2015  . Bipolar 1 disorder (HCC) 05/27/2015  . Delirium tremens (HCC) 04/03/2015  . Chronic pancreatitis (HCC) 04/03/2015  . Seizure (HCC) 04/02/2015  . Alcoholic pancreatitis 07/31/2014  . Rectal bleeding 07/31/2014  . Alcohol withdrawal (HCC) 07/31/2014  . Hyponatremia 07/31/2014  . Encephalopathy acute 07/31/2014  . Alcohol abuse     Past Surgical History  Procedure Laterality Date  . None      Current Outpatient Rx  Name  Route  Sig  Dispense  Refill  . citalopram (CELEXA) 20 MG tablet   Oral   Take 1 tablet (20 mg total) by mouth daily. For depression   30 tablet   0   . hydrOXYzine (ATARAX/VISTARIL) 25 MG tablet      Take 1 tablet (25 mg) four times daily as needed: For anxiety   60 tablet   0   . lisinopril (PRINIVIL,ZESTRIL) 5 MG tablet   Oral   Take 1 tablet (5 mg total) by mouth daily. Take 1 tablet (5 mg) daily: For HTN   30 tablet   0   . Multiple Vitamins-Minerals (MULTIVITAMIN WITH MINERALS)  tablet   Oral   Take 1 tablet by mouth daily. For low Vitamin         . traZODone (DESYREL) 50 MG tablet      Take 1 tablet (100 mg) at bedtime: For sleep   30 tablet   0     Allergies Bee venom  Family History  Problem Relation Age of Onset  . Diabetes Mother   . Hypertension Father     Social History Social History  Substance Use Topics  . Smoking status: Current Every Day Smoker -- 1.00 packs/day for 16 years    Types: Cigarettes  . Smokeless tobacco: None  . Alcohol Use: 42.0 oz/week    0 Standard drinks or equivalent, 70 Shots of liquor per week     Comment: 10 beers daily    Review of Systems Constitutional: No fever/chills. Eyes: No visual changes. ENT: No sore throat. No congestion or rhinorrhea. Cardiovascular: Denies chest pain. Denies palpitations. Respiratory: Denies shortness of breath.  No cough. Gastrointestinal: No abdominal pain.  No nausea, no vomiting.  No diarrhea.  No constipation. Genitourinary: Negative for dysuria. Musculoskeletal: Negative for back pain. Skin: Negative for rash. Neurological: Negative for headaches. No focal numbness, tingling or weakness.  Psychiatric:Positive alcohol dependence. Positive depression. Positive reported SI although the patient is currently denying any SI, HI or hallucinations. 10-point ROS otherwise negative.  ____________________________________________   PHYSICAL EXAM:  VITAL SIGNS: ED  Triage Vitals  Enc Vitals Group     BP 06/01/15 1846 107/68 mmHg     Pulse Rate 06/01/15 1846 85     Resp 06/01/15 1846 18     Temp 06/01/15 1846 97.8 F (36.6 C)     Temp Source 06/01/15 1846 Oral     SpO2 06/01/15 1846 98 %     Weight 06/01/15 1846 150 lb (68.04 kg)     Height 06/01/15 1846 5\' 7"  (1.702 m)     Head Cir --      Peak Flow --      Pain Score --      Pain Loc --      Pain Edu? --      Excl. in GC? --     Constitutional: Alert and oriented. Mild slurred speech consistent with intoxication  but patient is able to answer questions appropriately. Eyes: Conjunctivae are normal.  EOMI. No scleral icterus. Head: Atraumatic. Nose: No congestion/rhinnorhea. Mouth/Throat: Mucous membranes are moist.  Neck: No stridor.  Supple.   Cardiovascular: Normal rate, regular rhythm. No murmurs, rubs or gallops.  Respiratory: Normal respiratory effort.  No accessory muscle use or retractions. Lungs CTAB.  No wheezes, rales or ronchi. Gastrointestinal: Soft, nontender and nondistended.  No guarding or rebound.  No peritoneal signs. Musculoskeletal: No LE edema.  Neurologic:  A&Ox3.  Speech is clear.  Face and smile are symmetric.  EOMI.  Moves all extremities well. Skin:  Skin is warm, dry and intact. No rash noted. Psychiatric: Mood and affect are normal. Speech and behavior are normal.  Presentation is consistent with acute intoxication. Patient currently denies SI, HI or hallucinations.  ____________________________________________   LABS (all labs ordered are listed, but only abnormal results are displayed)  Labs Reviewed  CBC - Abnormal; Notable for the following:    RBC 4.04 (*)    MCV 100.7 (*)    MCH 34.3 (*)    All other components within normal limits  COMPREHENSIVE METABOLIC PANEL  ETHANOL  URINE DRUG SCREEN, QUALITATIVE ([facility] ONLY)   ____________________________________________  EKG  Not indicated ____________________________________________  RADIOLOGY  No results found.  ____________________________________________   PROCEDURES  Procedure(s) performed: None  Critical Care performed: No ____________________________________________   INITIAL IMPRESSION / ASSESSMENT AND PLAN / ED COURSE  Pertinent labs & imaging results that were available during my care of the patient were reviewed by me and considered in my medical decision making (see chart for details).  35 y.o. male with a history of alcohol dependence presenting with suicidal ideations in the setting  of acute alcohol intoxication. The patient does have a history of DTs in the past, but at this time his clinical examination and vital signs, as well as current intoxication, are not indicative of acute withdrawal. We will plan to continue to monitor him for this. We will plan medical clearance, and psychiatric evaluation.  In the meantime, the pt will be involuntarily committed for SI.  ____________________________________________  FINAL CLINICAL IMPRESSION(S) / ED DIAGNOSES  Final diagnoses:  Alcohol intoxication, uncomplicated (HCC)  Suicidal ideations  Depression      NEW MEDICATIONS STARTED DURING THIS VISIT:  New Prescriptions   No medications on file     Rockne MenghiniAnne-Caroline Audianna Landgren, MD 06/01/15 1921

## 2015-06-01 NOTE — ED Notes (Signed)
H/l listed from 05/27/15 d/c'd from computer

## 2015-06-01 NOTE — BH Assessment (Addendum)
Tele Assessment Note   Jerry Dunn is an 35 y.o.single male who was brought to the [facility] today by [facility] involuntarily c/o alcohol abuse/intoxication and bipolar disorder. Per pt record, IVC was petitioned by pt's sister.  Pt sts that his sister came to visit today and after talking to him and asking him to seek treatment pt refused. Pt sts that sister then petitioned for him to be brought in involuntarily for treatment. Pt sts that his plans were to contact [facility] tomorrow (Monday) about outpatient treatment. Pt sts he has been hospitalized 6 times in the last year (2 times for detox and 4 times for life saving measures for complication of alcohol intoxication). Pt denies SI, HI, SHI and AVH. Pt sts he does not have AVH but has on occasion when drinking alcohol.  Previous diagnoses include Bipolar D/O, alcohol abuse, pancreatitis, hepatitis, DTs, black-outs and seizures. Per pt record, last seizure was in March when pt tried to quit drinking alcohol "cold [place]." Pt sts he drinks about 1 pint of vodka every day and sts that he last drank alcohol today about 12 noon. Pt BAL was 383 when tested at the [facility] ED today.  UDS is incomplete at this time. Pt denies use of any recreational drugs. Pt sts that he has no real stressors but is stressed because his sister IVC'd him today.  Prior stressors have included financial issues due to medical bills, probation due to past DUI charges and conflict with his mother and family members over his alcohol consumption.   Pt sts he lives with his mother and father and has lived with them since November, 2016. Pt sts he is single and has not children. Pt has been employed doing Airline pilot work. Pt sts he graduated high school and completed some college. Pt sts he has no psychiatrist or therapist currently.  Pt sts he had plans to contact [facility] tomorrow about services.  Pt sts he has had OPT in the past but has not found a therapist he trusted.  Pt sts he was just discharged from  [facility] on 05/30/15 after treatment of the same symptoms. Pt sts he was treated at [facility] and [facility] in April for similar symptoms. Pt sts he has no hx of abuse: physical, emotional/verbal or sexual. Pt has a hx of arrests for DUI (2008 and 2015) and is currently on probation for a 2016 conviction. Pt sts he owns firearms but that they are secured by his family. Pt sts he sleeps an average of 6 hours per night and eats regularly not having gained or lost weight in the last few months. Pt denies SI, HI, SHI and AVH. Symptoms of depression include deep sadness, fatigue, excessive guilt, decreased self esteem, self isolation, lack of motivation for activities and pleasure, irritability, negative outlook, difficulty thinking & concentrating, feeling helpless and hopeless, sleep and eating disturbances. Pt denies all but minimal symptoms of anxiety.   Pt was dressed in scrubs and sitting on his hospital bed. Pt was alert, cooperative and pleasant. Pt kept good eye contact, spoke in a clear tone and at a normal pace. Pt moved in a normal manner when moving. Pt's thought process was coherent and relevant and judgement was impaired.  No indication of delusional thinking or response to internal stimuli. Pt's mood was stated to be depressed and a little anxious and his blunted affect was congruent.  Pt was oriented x 4, to person, place, time and situation.   Diagnosis: Bipolar  D/O; Alcohol Use D/O  Past Medical History:  Past Medical History  Diagnosis Date  . Pancreatitis   . Alcohol abuse     Past Surgical History  Procedure Laterality Date  . None      Family History:  Family History  Problem Relation Age of Onset  . Diabetes Mother   . Hypertension Father     Social History:  reports that he has been smoking Cigarettes.  He has a 16 pack-year smoking history. He does not have any smokeless tobacco history on file. He reports that he drinks about 42.0 oz of alcohol per week. He reports  that he does not use illicit drugs.  Additional Social History:  Alcohol / Drug Use Prescriptions: See PTA list Longest period of sobriety (when/how long): 2 years- 10 years ago Substance #1 Name of Substance 1: Alcohol 1 - Age of First Use: 17 1 - Amount (size/oz): pint of liquor usually vodka 1 - Frequency: daily 1 - Duration: ongoing 1 - Last Use / Amount: 06/01/15- vodka not sure amount   CIWA: CIWA-Ar BP: 107/68 mmHg Pulse Rate: 85 COWS:    PATIENT STRENGTHS: (choose at least two) Average or above average intelligence Communication skills Supportive family/friends  Allergies:  Allergies  Allergen Reactions  . Bee Venom Anaphylaxis and Swelling    Home Medications:  (Not in a hospital admission)  OB/GYN Status:  No LMP for male patient.  General Assessment Data [facility] Assessment: In system Is this a Tele or Face-to-Face Assessment?: Tele Assessment Is this an Initial Assessment or a Re-assessment for this encounter?: Initial Assessment Marital status: Single Maiden name: na Is patient pregnant?: No Pregnancy Status: No Living Arrangements: Parent (lives w mom and dad) Can pt return to current living arrangement?: Yes Admission Status: Involuntary (sister petitioned) Is patient capable of signing voluntary admission?: No (IVC'd) Referral Source: Self/Family/Friend Insurance type: none  Medical Screening Exam ([facility] Walk-in ONLY) Medical Exam completed: Yes  Crisis Care Plan Living Arrangements: Parent (lives w mom and dad) Legal Guardian:  (self) Name of Psychiatrist: none Name of Therapist: none  Education Status Is patient currently in school?: No Current Grade: na Highest grade of school patient has completed: 12 (graduated HS; some college) Name of school: [employer] Contact person: na  Risk to self with the past 6 months Suicidal Ideation: No (denies) Has patient been a risk to self within the past 6 months prior to  admission? : No Suicidal Intent: No (denies) Has patient had any suicidal intent within the past 6 months prior to admission? : No Is patient at risk for suicide?: No Suicidal Plan?: No (denies) Has patient had any suicidal plan within the past 6 months prior to admission? : No Access to Means: No (has access to guns- sts tey are secured away from him) What has been your use of drugs/alcohol within the last 12 months?: daily use Previous Attempts/Gestures: No (denies) How many times?: 0 Other Self Harm Risks: none noted Triggers for Past Attempts:  (na) Intentional Self Injurious Behavior: None Family Suicide History: No Recent stressful life event(s): Other (Comment) (Hospitalized several times w/i the last month) Persecutory voices/beliefs?: No Depression Symptoms: Tearfulness, Isolating, Fatigue, Guilt, Loss of interest in usual pleasures, Feeling worthless/self pity, Feeling angry/irritable Substance abuse history and/or treatment for substance abuse?: Yes Suicide prevention information given to non-admitted patients: Not applicable  Risk to Others within the past 6 months Homicidal Ideation: No (denies) Does patient have any lifetime  risk of violence toward others beyond the six months prior to admission? : No (denies) Thoughts of Harm to Others: No (deneis) Current Homicidal Intent: No Current Homicidal Plan: No Access to Homicidal Means: Yes Identified Victim: na History of harm to others?: No (denies) Assessment of Violence: None Noted Does patient have access to weapons?: Yes (Comment) Criminal Charges Pending?: Yes (failure to appear) Describe Pending Criminal Charges: failure to appear while hospitalized Does patient have a court date: Yes Court Date:  (not sure of date) Is patient on probation?: Yes (probation fro DUI)  Psychosis Hallucinations: None noted (denies) Delusions: None noted  Mental Status Report Appearance/Hygiene: Disheveled, In scrubs Eye Contact:  Good Motor Activity: Freedom of movement, Unremarkable Speech: Logical/coherent, Unremarkable Level of Consciousness: Alert Mood: Depressed, Pleasant Affect: Blunted, Depressed Anxiety Level: Minimal Thought Processes: Coherent, Relevant Judgement: Impaired Orientation: Person, Place, Time, Situation Obsessive Compulsive Thoughts/Behaviors: None  Cognitive Functioning Concentration: Fair Memory: Recent Intact, Remote Intact IQ: Average Insight: Fair Impulse Control: Poor Appetite: Good Weight Loss: 0 Weight Gain: 0 Sleep: No Change Total Hours of Sleep: 6 (drinks to sleep at times) Vegetative Symptoms: Staying in bed  ADLScreening [facility] Assessment Services) Patient's cognitive ability adequate to safely complete daily activities?: Yes Patient able to express need for assistance with ADLs?: Yes Independently performs ADLs?: Yes (appropriate for developmental age)  Prior Inpatient Therapy Prior Inpatient Therapy: Yes Prior Therapy Dates: 6 times in last year Prior Therapy Facilty/Provider(s): [facility], [facility], [place], [place], [facility] Reason for Treatment: Bipolar D/O; ETOH abuse  Prior Outpatient Therapy Prior Outpatient Therapy: Yes Prior Therapy Dates: pt not usre of dates Prior Therapy Facilty/Provider(s): [facility] Reason for Treatment: Bipolar D/O; ETOH abuse Does patient have an ACCT team?: No Does patient have Intensive In-House Services?  : No Does patient have [facility] services? : No Does patient have P4CC services?: No  ADL Screening (condition at time of admission) Patient's cognitive ability adequate to safely complete daily activities?: Yes Patient able to express need for assistance with ADLs?: Yes Independently performs ADLs?: Yes (appropriate for developmental age)       Abuse/Neglect Assessment (Assessment to be complete while patient is alone) Physical Abuse: Denies Verbal Abuse: Denies Sexual Abuse: Denies Exploitation of  patient/patient's resources: Denies Self-Neglect: Denies     Merchant navy officer (For Healthcare) Does patient have an advance directive?: No Would patient like information on creating an advanced directive?: No - patient declined information    Additional Information 1:1 In Past 12 Months?: No CIRT Risk: No Elopement Risk: No Does patient have medical clearance?: Yes     Disposition:  Disposition Initial Assessment Completed for this Encounter: Yes Disposition of Patient: Other dispositions (Pending review w [facility] Extender) Other disposition(s): Other (Comment)     Beryle Flock, MS, [facility], [facility] Triage Specialist [facility] T 06/01/2015 8:49 PM

## 2015-06-01 NOTE — ED Notes (Signed)
Pt arrived to ED in police Custody. Pt refused to talk to triage nurse. Pt has an IVC taken out by pts sister which states pt is suicidal and making claims to family that he will harm self. Pt is calm in triage.

## 2015-06-02 DIAGNOSIS — F1994 Other psychoactive substance use, unspecified with psychoactive substance-induced mood disorder: Secondary | ICD-10-CM

## 2015-06-02 LAB — ETHANOL: Alcohol, Ethyl (B): 149 mg/dL — ABNORMAL HIGH (ref <5)

## 2015-06-02 MED ORDER — LORAZEPAM 2 MG PO TABS: 0.0000 mg | ORAL_TABLET | Freq: Two times a day (BID) | ORAL | Status: DC

## 2015-06-02 MED ORDER — THIAMINE HCL 100 MG/ML IJ SOLN: 100.0000 mg | Freq: Every day | INTRAMUSCULAR | Status: DC

## 2015-06-02 MED ORDER — LORAZEPAM 2 MG PO TABS: 0.0000 mg | ORAL_TABLET | Freq: Four times a day (QID) | ORAL | Status: DC

## 2015-06-02 MED ORDER — VITAMIN B-1 100 MG PO TABS
100.0000 mg | ORAL_TABLET | Freq: Every day | ORAL | Status: DC
  Administered 2015-06-02: 100 mg via ORAL
  Filled 2015-06-02: qty 1

## 2015-06-02 NOTE — ED Notes (Signed)
D: [facility] Pt denies SI/HI/AVH. Pt is pleasant and cooperative. Pt stated he was joking when he made the comment of hurting himself and family had him committed. Pt appears to have insight into his drinking, but appears not to be ready to stop and realizes this, but his family is fed up with his drinking. Pt appears close to being ready to quit, but appears to be having issues with his family about his drinking. Pt stated he was going to out pt services but his sister had him committed.    A: Pt was offered support and encouragement.  Q 15 minute checks were done for safety.   R: safety maintained on unit.

## 2015-06-02 NOTE — Consult Note (Signed)
[facility] Consult   Reason for Consult:  Consult for this 35 year old man with alcohol abuse brought in by commitment filed by family Referring Physician:  Dahlia Client Patient Identification: Jerry Dunn MRN:  [id number] Principal Diagnosis: Substance induced mood disorder ([place]) Diagnosis:   Patient Active Problem List   Diagnosis Date Noted  . Alcohol dependence with uncomplicated withdrawal ([place]) [F10.230]   . Alcohol dependence ([place]) [F10.20] 05/28/2015  . Alcoholic hepatitis [G31.51] 05/27/2015  . Substance induced mood disorder ([place]) [F19.94] 05/27/2015  . Bipolar 1 disorder ([place]) [F31.9] 05/27/2015  . Delirium tremens ([place]) [F10.231] 04/03/2015  . Chronic pancreatitis ([place]) [K86.1] 04/03/2015  . Seizure ([place]) [R56.9] 04/02/2015  . Alcoholic pancreatitis [V61.6] 07/31/2014  . Rectal bleeding [K62.5] 07/31/2014  . Alcohol withdrawal ([place]) [F10.239] 07/31/2014  . Hyponatremia [E87.1] 07/31/2014  . Encephalopathy acute [G93.40] 07/31/2014  . Alcohol abuse [F10.10]     Total Time spent with patient: 1 hour  Subjective:   Jerry Dunn is a 35 y.o. male patient admitted with "my family doesn't approve of my drinking".  HPI:  Patient interviewed. Patient's old chart reviewed. Patient known from previous encounters as well. Labs and vitals reviewed. 35 year old man was petitioned by his family. Reports were made that he had made suicidal statements while intoxicated. Patient tells me that as far as he can remember he didn't say anything suicidal and that he certainly doesn't think that he ever had any suicidal thoughts or intention. He admits that he has continued to drink heavily every day mostly beer. He was just discharged from: Behavioral health a few days ago. Has not yet followed up with [facility]. Patient says he is not feeling particularly depressed or hopeless. He does feel nervous during alcohol withdrawal in his sleep continues to be erratic. He is not  currently having any hallucinations. Denies having done anything intentionally to harm himself denies any abuse of any other drugs. Patient says it is still his intention to try to go to [facility] and get outpatient treatment so that he can stop drinking.  Social history: He lives with his parents. Sr. visits frequently. Patient is not working. He has not been able to maintain any sobriety in months.  Medical history: History of multiple complications of alcohol abuse including pancreatitis GI bleeding alcoholic hepatitis. Has had DTs and seizures with withdrawal in the past.  Substance abuse history: Years of alcohol abuse with the negative effects just escalating. Has a history of delirium tremens briefly and of seizures with alcohol withdrawal in the past. Denies that he is abusing any other drugs.  Past Psychiatric History: Patient has behaved erratically with all intoxicated but denies any history of suicidal behavior. He was treated at [facility] inpatient probably about 5 or 6 months ago and was diagnosed with bipolar disorder and put on medications but was not compliant with follow-up. To my interview it does not sound like is a very clear history of bipolar disorder that can be separated from his alcohol abuse.  Risk to Self: Suicidal Ideation: No (denies) Suicidal Intent: No (denies) Is patient at risk for suicide?: No Suicidal Plan?: No (denies) Access to Means: No (has access to guns- sts tey are secured away from him) What has been your use of drugs/alcohol within the last 12 months?: daily use How many times?: 0 Other Self Harm Risks: none noted Triggers for Past Attempts:  (na) Intentional Self Injurious Behavior: None Risk to Others: Homicidal Ideation: No (denies) Thoughts of Harm to Others: No (  deneis) Current Homicidal Intent: No Current Homicidal Plan: No Access to Homicidal Means: Yes Identified Victim: na History of harm to others?: No (denies) Assessment of  Violence: None Noted Does patient have access to weapons?: Yes (Comment) Criminal Charges Pending?: Yes (failure to appear) Describe Pending Criminal Charges: failure to appear while hospitalized Does patient have a court date: Yes Court Date:  (not sure of date) Prior Inpatient Therapy: Prior Inpatient Therapy: Yes Prior Therapy Dates: 6 times in last year Prior Therapy Facilty/Provider(s): [facility], [place], [place], [facility] Reason for Treatment: Bipolar D/O; ETOH abuse Prior Outpatient Therapy: Prior Outpatient Therapy: Yes Prior Therapy Dates: pt not usre of dates Prior Therapy Facilty/Provider(s): [facility] Reason for Treatment: Bipolar D/O; ETOH abuse Does patient have an ACCT team?: No Does patient have Intensive In-House Services?  : No Does patient have [facility] services? : No Does patient have P4CC services?: No  Past Medical History:  Past Medical History  Diagnosis Date  . Pancreatitis   . Alcohol abuse   . DTs (delirium tremens) ([place])   . Bipolar affective [facility])     Past Surgical History  Procedure Laterality Date  . None     Family History:  Family History  Problem Relation Age of Onset  . Diabetes Mother   . Hypertension Father    Family Psychiatric  History: Family history is positive for alcohol abuse Social History:  History  Alcohol Use  . 42.0 oz/week  . 70 Shots of liquor, 0 Standard drinks or equivalent per week    Comment: 10 beers daily     History  Drug Use No    Social History   Social History  . Marital Status: Single    Spouse Name: N/A  . Number of Children: N/A  . Years of Education: N/A   Occupational History  . unemployed    Social History Main Topics  . Smoking status: Current Every Day Smoker -- 1.00 packs/day for 16 years    Types: Cigarettes  . Smokeless tobacco: None  . Alcohol Use: 42.0 oz/week    70 Shots of liquor, 0 Standard drinks or equivalent per week     Comment: 10 beers daily  . Drug Use: No   . Sexual Activity: Not Currently   Other Topics Concern  . None   Social History Narrative   Lives with parents at home   Additional Social History:    Allergies:   Allergies  Allergen Reactions  . Bee Venom Anaphylaxis and Swelling    Labs:  Results for orders placed or performed during the hospital encounter of 06/01/15 (from the past 48 hour(s))  CBC     Status: Abnormal   Collection Time: 06/01/15  6:56 PM  Result Value Ref Range   WBC 10.6 3.8 - 10.6 K/uL   RBC 4.04 (L) 4.40 - 5.90 MIL/uL   Hemoglobin 13.9 13.0 - 18.0 g/dL   HCT 40.7 40.0 - 52.0 %   MCV 100.7 (H) 80.0 - 100.0 fL   MCH 34.3 (H) 26.0 - 34.0 pg   MCHC 34.1 32.0 - 36.0 g/dL   RDW 13.8 11.5 - 14.5 %   Platelets 314 150 - 440 K/uL  Comprehensive metabolic panel     Status: Abnormal   Collection Time: 06/01/15  6:56 PM  Result Value Ref Range   Sodium 139 135 - 145 mmol/L   Potassium 4.2 3.5 - 5.1 mmol/L   Chloride 100 (L) 101 - 111 mmol/L  CO2 24 22 - 32 mmol/L   Glucose, Bld 92 65 - 99 mg/dL   BUN 9 6 - 20 mg/dL   Creatinine, Ser 0.97 0.61 - 1.24 mg/dL   Calcium 9.3 8.9 - 10.3 mg/dL   Total Protein 9.1 (H) 6.5 - 8.1 g/dL   Albumin 4.7 3.5 - 5.0 g/dL   AST 276 (H) 15 - 41 U/L   ALT 299 (H) 17 - 63 U/L   Alkaline Phosphatase 75 38 - 126 U/L   Total Bilirubin 0.6 0.3 - 1.2 mg/dL   GFR calc non Af Amer >60 >60 mL/min   GFR calc Af Amer >60 >60 mL/min    Comment: (NOTE) The eGFR has been calculated using the CKD EPI equation. This calculation has not been validated in all clinical situations. eGFR's persistently <60 mL/min signify possible Chronic Kidney Disease.    Anion gap 15 5 - 15  Ethanol     Status: Abnormal   Collection Time: 06/01/15  6:56 PM  Result Value Ref Range   Alcohol, Ethyl (B) 383 (HH) <5 mg/dL    Comment: CRITICAL RESULT CALLED TO, READ BACK BY AND VERIFIED WITH  NOEL WEBSTER AT 2006 06/01/15 SDR        LOWEST DETECTABLE LIMIT FOR SERUM ALCOHOL IS 5 mg/dL FOR MEDICAL  PURPOSES ONLY   Ethanol     Status: Abnormal   Collection Time: 06/02/15  3:52 AM  Result Value Ref Range   Alcohol, Ethyl (B) 149 (H) <5 mg/dL    Comment:        LOWEST DETECTABLE LIMIT FOR SERUM ALCOHOL IS 5 mg/dL FOR MEDICAL PURPOSES ONLY     Current Facility-Administered Medications  Medication Dose Route Frequency Provider Last Rate Last Dose  . LORazepam (ATIVAN) tablet 0-4 mg  0-4 mg Oral Q6H Loney Hering, MD   0 mg at 06/02/15 0618   Followed by  . [START ON 06/04/2015] LORazepam (ATIVAN) tablet 0-4 mg  0-4 mg Oral Q12H Loney Hering, MD      . thiamine (VITAMIN B-1) tablet 100 mg  100 mg Oral Daily Loney Hering, MD   100 mg at 06/02/15 1010   Or  . thiamine (B-1) injection 100 mg  100 mg Intravenous Daily Loney Hering, MD       Current Outpatient Prescriptions  Medication Sig Dispense Refill  . citalopram (CELEXA) 20 MG tablet Take 1 tablet (20 mg total) by mouth daily. For depression 30 tablet 0  . lisinopril (PRINIVIL,ZESTRIL) 5 MG tablet Take 1 tablet (5 mg total) by mouth daily. Take 1 tablet (5 mg) daily: For HTN 30 tablet 0  . Multiple Vitamins-Minerals (MULTIVITAMIN WITH MINERALS) tablet Take 1 tablet by mouth daily. For low Vitamin    . traZODone (DESYREL) 50 MG tablet Take 1 tablet (100 mg) at bedtime: For sleep 30 tablet 0  . hydrOXYzine (ATARAX/VISTARIL) 25 MG tablet Take 1 tablet (25 mg) four times daily as needed: For anxiety 60 tablet 0    Musculoskeletal: Strength & Muscle Tone: within normal limits Gait & Station: normal Patient leans: N/A  Psychiatric Specialty Exam: Review of Systems  Constitutional: Negative.   HENT: Negative.   Eyes: Negative.   Respiratory: Negative.   Cardiovascular: Negative.   Gastrointestinal: Negative.   Musculoskeletal: Negative.   Skin: Negative.   Neurological: Negative.   Psychiatric/Behavioral: Positive for memory loss and substance abuse. Negative for depression, suicidal ideas and  hallucinations. The patient is nervous/anxious and has insomnia.  Blood pressure 117/75, pulse 72, temperature 97.8 F (36.6 C), temperature source Oral, resp. rate 20, height _0  (1.702 m), weight 68.04 kg (150 lb), SpO2 99 %.Body mass index is 23.49 kg/(m^2).  General Appearance: Disheveled  Eye Sport and exercise psychologist::  Fair  Speech:  Slow  Volume:  Decreased  Mood:  Euthymic  Affect:  Flat  Thought Process:  Linear  Orientation:  Full (Time, Place, and Person)  Thought Content:  Negative  Suicidal Thoughts:  No  Homicidal Thoughts:  No  Memory:  Immediate;   Good Recent;   Fair Remote;   Poor  Judgement:  Fair  Insight:  Fair  Psychomotor Activity:  Psychomotor Retardation  Concentration:  Fair  Recall:  [employer] of Knowledge:Fair  Language: Fair  Akathisia:  No  Handed:  Right  AIMS (if indicated):     Assets:  Agricultural consultant Housing Resilience Social Support  ADL's:  Impaired  Cognition: Impaired,  Mild  Sleep:      Treatment Plan Summary: Plan 35 year old man with alcohol abuse. Multiple hospitalizations recently for detox. Patient has been counseled repeatedly about the life threatening nature of his behavior and the availability of substance abuse treatment. So far he continues to not follow-up with appropriate treatment and to relapse into drinking. On examination today the patient is calm and lucid. Vital signs stable. Doesn't show any sign of delirium. Patient is denying any suicidal ideation and does not appear to be at risk of intentional self injury. Primary problem is alcohol abuse. No indication for admission to psychiatric ward. Patient has once again been counseled about how he is drinking himself to death and the multiple physical problems available. Tried to do some supportive motivational interviewing to encourage him to stay sober and to get involved in outpatient treatment. Patient does not meet commitment criteria. He can be  discharged from the emergency room and follow-up locally.  Disposition: Patient does not meet criteria for psychiatric inpatient admission.  Alethia Berthold, MD 06/02/2015 2:23 PM

## 2015-06-02 NOTE — Discharge Instructions (Signed)
Alcohol Intoxication Alcohol intoxication occurs when you drink enough alcohol that it affects your ability to function. It can be mild or very severe. Drinking a lot of alcohol in a short time is called binge drinking. This can be very harmful. Drinking alcohol can also be more dangerous if you are taking medicines or other drugs. Some of the effects caused by alcohol may include:  Loss of coordination.  Changes in mood and behavior.  Unclear thinking.  Trouble talking (slurred speech).  Throwing up (vomiting).  Confusion.  Slowed breathing.  Twitching and shaking (seizures).  Loss of consciousness. HOME CARE  Do not drive after drinking alcohol.  Drink enough water and fluids to keep your pee (urine) clear or pale yellow. Avoid caffeine.  Only take medicine as told by your doctor. GET HELP IF:  You throw up (vomit) many times.  You do not feel better after a few days.  You frequently have alcohol intoxication. Your doctor can help decide if you should see a substance use treatment counselor. GET HELP RIGHT AWAY IF:  You become shaky when you stop drinking.  You have twitching and shaking.  You throw up blood. It may look bright red or like coffee grounds.  You notice blood in your poop (bowel movements).  You become lightheaded or pass out (faint). MAKE SURE YOU:   Understand these instructions.  Will watch your condition.  Will get help right away if you are not doing well or get worse.   This information is not intended to replace advice given to you by your health care provider. Make sure you discuss any questions you have with your health care provider.   Document Released: 06/30/2007 Document Revised: 09/13/2012 Document Reviewed: 06/16/2012 Elsevier Interactive Patient Education 2016 [employer].  Alcohol Use Disorder Alcohol use disorder is a mental disorder. It is not a one-time incident of heavy drinking. Alcohol use disorder is the excessive  and uncontrollable use of alcohol over time that leads to problems with functioning in one or more areas of daily living. People with this disorder risk harming themselves and others when they drink to excess. Alcohol use disorder also can cause other mental disorders, such as mood and anxiety disorders, and serious physical problems. People with alcohol use disorder often misuse other drugs.  Alcohol use disorder is common and widespread. Some people with this disorder drink alcohol to cope with or escape from negative life events. Others drink to relieve chronic pain or symptoms of mental illness. People with a family history of alcohol use disorder are at higher risk of losing control and using alcohol to excess.  Drinking too much alcohol can cause injury, accidents, and health problems. One drink can be too much when you are:  Working.  Pregnant or breastfeeding.  Taking medicines. Ask your doctor.  Driving or planning to drive. SYMPTOMS  Signs and symptoms of alcohol use disorder may include the following:   Consumption ofalcohol inlarger amounts or over a longer period of time than intended.  Multiple unsuccessful attempts to cutdown or control alcohol use.   A great deal of time spent obtaining alcohol, using alcohol, or recovering from the effects of alcohol (hangover).  A strong desire or urge to use alcohol (cravings).   Continued use of alcohol despite problems at work, school, or home because of alcohol use.   Continued use of alcohol despite problems in relationships because of alcohol use.  Continued use of alcohol in situations when it is physically hazardous,  such as driving a car.  Continued use of alcohol despite awareness of a physical or psychological problem that is likely related to alcohol use. Physical problems related to alcohol use can involve the brain, heart, liver, stomach, and intestines. Psychological problems related to alcohol use include  intoxication, depression, anxiety, psychosis, delirium, and dementia.   The need for increased amounts of alcohol to achieve the same desired effect, or a decreased effect from the consumption of the same amount of alcohol (tolerance).  Withdrawal symptoms upon reducing or stopping alcohol use, or alcohol use to reduce or avoid withdrawal symptoms. Withdrawal symptoms include:  Racing heart.  Hand tremor.  Difficulty sleeping.  Nausea.  Vomiting.  Hallucinations.  Restlessness.  Seizures. DIAGNOSIS Alcohol use disorder is diagnosed through an assessment by your health care provider. Your health care provider may start by asking three or four questions to screen for excessive or problematic alcohol use. To confirm a diagnosis of alcohol use disorder, at least two symptoms must be present within a 66-month period. The severity of alcohol use disorder depends on the number of symptoms:  Mild--two or three.  Moderate--four or five.  Severe--six or more. Your health care provider may perform a physical exam or use results from lab tests to see if you have physical problems resulting from alcohol use. Your health care provider may refer you to a mental health professional for evaluation. TREATMENT  Some people with alcohol use disorder are able to reduce their alcohol use to low-risk levels. Some people with alcohol use disorder need to quit drinking alcohol. When necessary, mental health professionals with specialized training in substance use treatment can help. Your health care provider can help you decide how severe your alcohol use disorder is and what type of treatment you need. The following forms of treatment are available:   Detoxification. Detoxification involves the use of prescription medicines to prevent alcohol withdrawal symptoms in the first week after quitting. This is important for people with a history of symptoms of withdrawal and for heavy drinkers who are likely to  have withdrawal symptoms. Alcohol withdrawal can be dangerous and, in severe cases, cause death. Detoxification is usually provided in a hospital or in-patient substance use treatment facility.  Counseling or talk therapy. Talk therapy is provided by substance use treatment counselors. It addresses the reasons people use alcohol and ways to keep them from drinking again. The goals of talk therapy are to help people with alcohol use disorder find healthy activities and ways to cope with life stress, to identify and avoid triggers for alcohol use, and to handle cravings, which can cause relapse.  Medicines.Different medicines can help treat alcohol use disorder through the following actions:  Decrease alcohol cravings.  Decrease the positive reward response felt from alcohol use.  Produce an uncomfortable physical reaction when alcohol is used (aversion therapy).  Support groups. Support groups are run by people who have quit drinking. They provide emotional support, advice, and guidance. These forms of treatment are often combined. Some people with alcohol use disorder benefit from intensive combination treatment provided by specialized substance use treatment centers. Both inpatient and outpatient treatment programs are available.   This information is not intended to replace advice given to you by your health care provider. Make sure you discuss any questions you have with your health care provider.   Document Released: 02/19/2004 Document Revised: 02/01/2014 Document Reviewed: 04/20/2012 Elsevier Interactive Patient Education 2016 [employer]. Substance Abuse Treatment Programs  Intensive Outpatient Programs [facility]  [employer]     [address]      [place], [place]                   [phone]       [facility] [address] [place], [place] [phone]  [facility]     (Inpatient and outpatient)     [address]           [phone]    [facility] [phone] (Suboxone and Methadone)  [address]      [place], [place] [address]      [phone]       [address] [place], [place] [phone]  [facility] (Outpatient/Inpatient, Chemical)    (insurance only) [phone]             [facility] (Groups & Residential) [place], [place] [phone]     [facility]     [address]     [place], [place]      [phone]       [facility] (for caregivers and family) [phone] [address] [place], [place] [phone]      Residential Treatment Programs [facility]      [address] [place], [place] [address]  [phone]       [facility] [address], [place], [place] [address] [phone]  [facility] [place]        [phone]       [facility] [phone]  [facility] ([facility]             [address]                                         [place], [place]                                                [phone] or [phone]                               [facility] [address] [place], [address] [phone]  [facility]    [address]      [place], [place]     [phone]       [facility] [address] [place], [place] [phone]  [facility]   [address] [place], [place] [address]     [phone]      Admissions: 8am-3pm M-F  [facility] ([facility]) [address] [place], [place] [phone]  [facility]: Residential Program [phone] Days)   [place], [place]      [phone] or [phone]     [facility]: [facility] [place], [place] (Walk in Hours over the weekend or by referral)  [facility] [address] [place], [place], [place] [address] [phone]  Crisis Mobile: Therapeutic  Alternatives:  [phone] (for crisis response 24 hours a day) [facility] Hotline:      2183813436 Outpatient Psychiatry and Counseling  [facility]rapeutic Altern[facility]: Mobile Crisis Management 24 hours:  [phone]  [facility]  of the [place] sliding scale fee and walk in schedule: M-F 8am-12pm/1pm-3pm [address]  [place], [place] [address] [phone]  [facility] [address] [place], [place] [address] [phone]  [facility] (Formerly [facility] as [facility] [employer])- new patient walk-in appointments available Monday - Friday 8am -3pm.          [address] [place], [place] [address] [phone] or crisis line- [phone]  [facility] Services/ Intensive Outpatient Therapy Program [address] [place], [place] [address] [phone]  [facility]                  Crisis Services      [phone] [address]     [place], [place] [address]                 [facility]   [facility] [phone]. [address] [place], [place] [address]   [employer] of Care          [address]  [place], [place] [address]       [phone]  [facility] [address] [place], [place] [address] [phone]  [facility]    [address]    [place], [place] [address]     [phone]       Andee Poles, MD     [address]     [place] [address]     [phone]       [facility] [address] [place] [address]  [facility]     [address] [place], [place]      [phone]       [facility] Eulogio Ditch, MD [address] [place], [place] [address] [phone]  [facility]     [address]     [place], [place]  [address]     [phone]       [facility] [address] [place], [place] [address] [phone] Resources for Temporary Residential Assistance/Crisis Centers  [facility]) M-F 8am-3pm   [address] [place], [place] [address]   [phone] Services include: laundry, barbering, support groups, case management, phone  & computer access, showers, AA/NA mtgs, mental health/substance abuse nurse, job skills class, disability information, VA assistance, spiritual classes, etc.   HOMELESS SHELTERS  [facility]     [facility]   [address] [place]     [phone]              [employer] (women and children)       [address] [place], [place] [address] [phone] [email or web address] for application and process Application Required  [facility]   [address]    [place] [address]     [phone]                    [facility] [address] [place], [place] [address] [phone] application appt.) Application Required  [facility] (women only)    [address]     [place], [place] [address]     [phone]      Intake starts 6pm daily Need valid ID,  SSC, & Police report [facility] [place] [address] [place], [place] [phone] Application Required  [facility] (men only)     [address]      [place], [place]     [phone]       [facility] [place] (Pregnant women only) [address] [place], [place] [phone]  [facility]      [address]      [place], [place] [address]     [phone]             [facility] [address] [place], [place] [phone] 90 day commitment/SA/Application process  [facility](men only)     [address]     [place],  [place]     [phone]       Check-in at [facility]            C[facility] [address] [place], [place] [phone] Men/Women/Women and Children must be there by 7 pm  [facility] [place], [place] [phone]

## 2015-06-02 NOTE — ED Notes (Signed)
Patient cooperative with all nursing interventions. Currently denies s/s ETOH withdrawal. Maintained on all safety checks.

## 2015-06-02 NOTE — ED Notes (Signed)
Patient resting quietly in room. No noted distress or abnormal behaviors noted. Will continue 15 minute checks and observation by security camera for safety. 

## 2015-06-02 NOTE — ED Provider Notes (Signed)
-----------------------------------------   7:39 AM on 06/02/2015 -----------------------------------------   Blood pressure 115/79, pulse 73, temperature 97.8 F (36.6 C), temperature source Oral, resp. rate 18, height 5\' 7"  (1.702 m), weight 150 lb (68.04 kg), SpO2 100 %.  The patient had no acute events since last update.  Calm and cooperative at this time.    Patient awaiting psych reevaluation, the patient was placed on the CIWA protocol last night.  Rebecka ApleyAllison P Jaqualin Serpa, MD 06/02/15 [phone]

## 2015-06-02 NOTE — ED Provider Notes (Signed)
Case discussed with Dr. Toni Amendlapacs of psychiatry after his evaluation in the emergency department. Feels the patient is psychiatrically stable and not an imminent danger to himself or others. Because of the presenting complaints were related to alcohol intoxication and abuse. He remains medically stable and stable for discharge and follow-up with [facility].  Sharman CheekPhillip Anaiya Wisinski, MD 06/02/15 [phone]

## 2015-06-02 NOTE — ED Notes (Signed)
Patient meeting with psychiatrist.  

## 2015-06-02 NOTE — ED Notes (Signed)
Patient is aware he will be discharged later today. Currently in no apparent distress. Maintained on all safety precautions.

## 2015-06-02 NOTE — ED Notes (Signed)
Patient discharged ambulatory to self. He denies SI or HI. Discharge instructions reviewed with staff, he verbalizes understanding. Patient received copy of DC plan and all personal belongings. 

## 2015-06-02 NOTE — ED Notes (Signed)
[facility], need repeat ethanol level.  EDP notified.

## 2015-06-02 NOTE — ED Notes (Signed)

## 2015-10-20 ENCOUNTER — Emergency Department
Admission: EM | Admit: 2015-10-20 | Discharge: 2015-10-20 | Disposition: A | Discharge status: Home / Self Care | Payer: Self-pay | Attending: Emergency Medicine | Admitting: Emergency Medicine

## 2015-10-20 ENCOUNTER — Encounter: Payer: Self-pay | Admitting: Emergency Medicine

## 2015-10-20 DIAGNOSIS — F17210 Nicotine dependence, cigarettes, uncomplicated: Secondary | ICD-10-CM | POA: Insufficient documentation

## 2015-10-20 DIAGNOSIS — K860 Alcohol-induced chronic pancreatitis: Secondary | ICD-10-CM | POA: Insufficient documentation

## 2015-10-20 DIAGNOSIS — R112 Nausea with vomiting, unspecified: Secondary | ICD-10-CM

## 2015-10-20 DIAGNOSIS — R197 Diarrhea, unspecified: Secondary | ICD-10-CM

## 2015-10-20 LAB — COMPREHENSIVE METABOLIC PANEL
ALK PHOS: 79 U/L (ref 38–126)
ALT: 298 U/L — ABNORMAL HIGH (ref 17–63)
ANION GAP: 14 (ref 5–15)
AST: 411 U/L — ABNORMAL HIGH (ref 15–41)
Albumin: 4.9 g/dL (ref 3.5–5.0)
BUN: 8 mg/dL (ref 6–20)
CALCIUM: 9.4 mg/dL (ref 8.9–10.3)
CO2: 27 mmol/L (ref 22–32)
Chloride: 99 mmol/L — ABNORMAL LOW (ref 101–111)
Creatinine, Ser: 0.83 mg/dL (ref 0.61–1.24)
Glucose, Bld: 127 mg/dL — ABNORMAL HIGH (ref 65–99)
Potassium: 3.4 mmol/L — ABNORMAL LOW (ref 3.5–5.1)
SODIUM: 140 mmol/L (ref 135–145)
TOTAL PROTEIN: 8.9 g/dL — AB (ref 6.5–8.1)
Total Bilirubin: 0.6 mg/dL (ref 0.3–1.2)

## 2015-10-20 LAB — URINALYSIS COMPLETE WITH MICROSCOPIC (ARMC ONLY)
BACTERIA UA: NONE SEEN
BILIRUBIN URINE: NEGATIVE
Glucose, UA: NEGATIVE mg/dL
Hgb urine dipstick: NEGATIVE
KETONES UR: NEGATIVE mg/dL
Leukocytes, UA: NEGATIVE
NITRITE: NEGATIVE
PH: 7 (ref 5.0–8.0)
PROTEIN: NEGATIVE mg/dL
RBC / HPF: NONE SEEN RBC/hpf (ref 0–5)
SPECIFIC GRAVITY, URINE: 1.006 (ref 1.005–1.030)
Squamous Epithelial / LPF: NONE SEEN
WBC UA: NONE SEEN WBC/hpf (ref 0–5)

## 2015-10-20 LAB — CBC
HEMATOCRIT: 37.8 % — AB (ref 40.0–52.0)
HEMOGLOBIN: 13.3 g/dL (ref 13.0–18.0)
MCH: 33.6 pg (ref 26.0–34.0)
MCHC: 35.2 g/dL (ref 32.0–36.0)
MCV: 95.6 fL (ref 80.0–100.0)
Platelets: 115 10*3/uL — ABNORMAL LOW (ref 150–440)
RBC: 3.96 MIL/uL — AB (ref 4.40–5.90)
RDW: 16.6 % — ABNORMAL HIGH (ref 11.5–14.5)
WBC: 8.5 10*3/uL (ref 3.8–10.6)

## 2015-10-20 LAB — LIPASE, BLOOD: Lipase: 74 U/L — ABNORMAL HIGH (ref 11–51)

## 2015-10-20 LAB — ETHANOL: Alcohol, Ethyl (B): 335 mg/dL (ref <5)

## 2015-10-20 MED ORDER — HYDROMORPHONE HCL 1 MG/ML IJ SOLN
1.0000 mg | Freq: Once | INTRAMUSCULAR | Status: AC
  Administered 2015-10-20: 1 mg via INTRAVENOUS
  Filled 2015-10-20: qty 1

## 2015-10-20 MED ORDER — OXYCODONE-ACETAMINOPHEN 5-325 MG PO TABS: 1.0000 | ORAL_TABLET | Freq: Four times a day (QID) | ORAL | 0 refills | Status: DC | PRN

## 2015-10-20 MED ORDER — SODIUM CHLORIDE 0.9 % IV BOLUS (SEPSIS)
1000.0000 mL | Freq: Once | INTRAVENOUS | Status: AC
  Administered 2015-10-20: 1000 mL via INTRAVENOUS

## 2015-10-20 MED ORDER — MORPHINE SULFATE (PF) 4 MG/ML IV SOLN
4.0000 mg | Freq: Once | INTRAVENOUS | Status: AC
  Administered 2015-10-20: 4 mg via INTRAVENOUS
  Filled 2015-10-20: qty 1

## 2015-10-20 MED ORDER — ONDANSETRON 4 MG PO TBDP: 4.0000 mg | ORAL_TABLET | Freq: Three times a day (TID) | ORAL | 0 refills | Status: DC | PRN

## 2015-10-20 MED ORDER — ONDANSETRON HCL 4 MG/2ML IJ SOLN
4.0000 mg | Freq: Once | INTRAMUSCULAR | Status: AC
  Administered 2015-10-20: 4 mg via INTRAVENOUS
  Filled 2015-10-20: qty 2

## 2015-10-20 NOTE — ED Notes (Signed)
Patient reports pain goes for 6 up to a 10 in waves.

## 2015-10-20 NOTE — ED Notes (Signed)
Pt asking about wait time. Explained to pt about how pts are seen based on acuity. Pt states pain is worse. Offered pt pain medication per protocol but pt refused the medication at this time. Pt understanding about wait.

## 2015-10-20 NOTE — ED Triage Notes (Signed)
Pt presents to ED with c/o abd pain that wraps around to his back for over a week with vomiting and diarrhea. Similar pain previously and was dx with pancreatitis. Pt alert and calm at this time. No distress noted.

## 2015-10-20 NOTE — ED Provider Notes (Signed)
[facility] Emergency Department Provider Note   ____________________________________________   First MD Initiated Contact with Patient 10/20/15 0425     (approximate)  I have reviewed the triage vital signs and the nursing notes.   HISTORY  Chief Complaint Abdominal Pain; Vomiting; and Diarrhea    HPI Jerry Dunn is a 35 y.o. male who comes into the hospital today thinking he has pancreatitis. The patient reports that he has had vomiting, diarrhea, extreme thirst, lethargy weight fluctuation. He reports that he was at [facility] last year for pancreatitis. The patient reports that the symptoms went away but then it came back for the past 2 weeks. He reports that he has been taking Tylenol for his pain and this pain as an 8 out of 10 in intensity. The patient reports that he is here tonight because pain has not gotten worse. He reports that he was drinking alcohol before he came into the hospital tonight. He reports that if he doesn't drink he will go into withdrawals. 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 He drinks about a fifth of vodka a day. The patient is here for evaluation.   Past Medical History:  Diagnosis Date  . Alcohol abuse   . Bipolar affective (HCC)   . DTs (delirium tremens) (HCC)   . Pancreatitis     Patient Active Problem List   Diagnosis Date Noted  . Alcohol dependence with uncomplicated withdrawal (HCC)   . Alcohol dependence (HCC) 05/28/2015  . Alcoholic hepatitis 05/27/2015  . Substance induced mood disorder (HCC) 05/27/2015  . Bipolar 1 disorder (HCC) 05/27/2015  . Delirium tremens (HCC) 04/03/2015  . Chronic pancreatitis (HCC) 04/03/2015  . Seizure (HCC) 04/02/2015  . Alcoholic pancreatitis 07/31/2014  . Rectal bleeding 07/31/2014  . Alcohol withdrawal (HCC) 07/31/2014  . Hyponatremia 07/31/2014  . Encephalopathy acute 07/31/2014  . Alcohol abuse     Past Surgical History:  Procedure  Laterality Date  . none      Prior to Admission medications   Medication Sig Start Date End Date Taking? Authorizing Provider  citalopram (CELEXA) 20 MG tablet Take 1 tablet (20 mg total) by mouth daily. For depression 05/30/15  Yes Sanjuana Kava, NP  hydrOXYzine (ATARAX/VISTARIL) 25 MG tablet Take 1 tablet (25 mg) four times daily as needed: For anxiety Patient taking differently: Take 25 mg by mouth every 6 (six) hours as needed for anxiety.  05/30/15  Yes Sanjuana Kava, NP  Multiple Vitamins-Minerals (MULTIVITAMIN WITH MINERALS) tablet Take 1 tablet by mouth daily. For low Vitamin 05/30/15  Yes Sanjuana Kava, NP  ondansetron (ZOFRAN ODT) 4 MG disintegrating tablet Take 1 tablet (4 mg total) by mouth every 8 (eight) hours as needed for nausea or vomiting. 10/20/15   Rebecka Apley, MD  oxyCODONE-acetaminophen (ROXICET) 5-325 MG tablet Take 1 tablet by mouth every 6 (six) hours as needed. 10/20/15   Rebecka Apley, MD    Allergies Bee venom  Family History  Problem Relation Age of Onset  . Diabetes Mother   . Hypertension Father     Social History Social History  Substance Use Topics  . Smoking status: Current Every Day Smoker    Packs/day: 1.00    Years: 16.00    Types: Cigarettes  . Smokeless tobacco: Never Used  . Alcohol use 42.0 oz/week    70 Shots of liquor per week     Comment: 10 beers daily    Review of Systems Constitutional:  No fever/chills Eyes: No visual changes. ENT: No sore throat. Cardiovascular: Denies chest pain. Respiratory: Denies shortness of breath. Gastrointestinal:  abdominal pain.   nausea, vomiting.  No diarrhea.  No constipation. Genitourinary: Negative for dysuria. Musculoskeletal: Negative for back pain. Skin: Negative for rash. Neurological: Negative for headaches, focal weakness or numbness.  10-point ROS otherwise negative.  ____________________________________________   PHYSICAL EXAM:  VITAL SIGNS: ED Triage Vitals  Enc Vitals  Group     BP 10/20/15 0107 (!) 154/105     Pulse Rate 10/20/15 0107 (!) 114     Resp 10/20/15 0107 20     Temp 10/20/15 0107 98.3 F (36.8 C)     Temp Source 10/20/15 0107 Oral     SpO2 10/20/15 0107 97 %     Weight 10/20/15 0107 140 lb (63.5 kg)     Height 10/20/15 0107 5\' 7"  (1.702 m)     Head Circumference --      Peak Flow --      Pain Score 10/20/15 0108 9     Pain Loc --      Pain Edu? --      Excl. in GC? --     Constitutional: Alert and oriented. Well appearing and in Moderate distress. Eyes: Conjunctivae are normal. PERRL. EOMI. Head: Atraumatic. Nose: No congestion/rhinnorhea. Mouth/Throat: Mucous membranes are moist.  Oropharynx non-erythematous. Cardiovascular: Normal rate, regular rhythm. Grossly normal heart sounds.  Good peripheral circulation. Respiratory: Normal respiratory effort.  No retractions. Lungs CTAB. Gastrointestinal: Soft with epigastric tenderness to palpation. No distention. Positive bowel sounds Musculoskeletal: No lower extremity tenderness nor edema.   Neurologic:  Normal speech and language.  Skin:  Skin is warm, dry and intact.  Psychiatric: Mood and affect are normal.   ____________________________________________   LABS (all labs ordered are listed, but only abnormal results are displayed)  Labs Reviewed  LIPASE, BLOOD - Abnormal; Notable for the following:       Result Value   Lipase 74 (*)    All other components within normal limits  COMPREHENSIVE METABOLIC PANEL - Abnormal; Notable for the following:    Potassium 3.4 (*)    Chloride 99 (*)    Glucose, Bld 127 (*)    Total Protein 8.9 (*)    AST 411 (*)    ALT 298 (*)    All other components within normal limits  CBC - Abnormal; Notable for the following:    RBC 3.96 (*)    HCT 37.8 (*)    RDW 16.6 (*)    Platelets 115 (*)    All other components within normal limits  URINALYSIS COMPLETEWITH MICROSCOPIC ([facility] ONLY) - Abnormal; Notable for the following:    Color, Urine  STRAW (*)    APPearance CLEAR (*)    All other components within normal limits  ETHANOL - Abnormal; Notable for the following:    Alcohol, Ethyl (B) 335 (*)    All other components within normal limits   ____________________________________________  EKG  none ____________________________________________  RADIOLOGY  none ____________________________________________   PROCEDURES  Procedure(s) performed: None  Procedures  Critical Care performed: No  ____________________________________________   INITIAL IMPRESSION / ASSESSMENT AND PLAN / ED COURSE  Pertinent labs & imaging results that were available during my care of the patient were reviewed by me and considered in my medical decision making (see chart for details).  This is a 35 year old male with a history of pancreatitis who comes into the hospital today with epigastric pain  and pancreatitis. The patient comes in with increasing pain. The patient received some morphine for his pain and a liter of normal saline. After the morphine the patient reports that he was still having pain. He does have an elevated blood alcohol level but I will give the patient a dose of Dilaudid and see if he is able to take some fluids by mouth. I will reassess the patient.   Clinical Course    The patient will be discharged home to follow-up with the acute care clinic. He will receive some nausea medicine and pain medicine for home. ____________________________________________   FINAL CLINICAL IMPRESSION(S) / ED DIAGNOSES  Final diagnoses:  Alcohol-induced chronic pancreatitis (HCC)  Nausea vomiting and diarrhea      NEW MEDICATIONS STARTED DURING THIS VISIT:  New Prescriptions   ONDANSETRON (ZOFRAN ODT) 4 MG DISINTEGRATING TABLET    Take 1 tablet (4 mg total) by mouth every 8 (eight) hours as needed for nausea or vomiting.   OXYCODONE-ACETAMINOPHEN (ROXICET) 5-325 MG TABLET    Take 1 tablet by mouth every 6 (six) hours as  needed.     Note:  This document was prepared using Dragon voice recognition software and may include unintentional dictation errors.    Rebecka Apley, MD 10/20/15 [phone]

## 2015-10-20 NOTE — ED Notes (Signed)
Dr. Zenda AlpersWebster notified of critical lab value - Etoh 335.  Acknowledged, no new orders.

## 2015-10-20 NOTE — ED Notes (Signed)
Patient reports having abdominal pain similar to previous episode of pancreatitis for approximately a week.  Patient reports that he drinks a 5th of liquor daily and last drink was just prior to arriving at the hospital.  Patient reports that the pain comes in waves.

## 2015-10-30 ENCOUNTER — Encounter: Payer: Self-pay | Admitting: *Deleted

## 2015-10-30 ENCOUNTER — Emergency Department
Admission: EM | Admit: 2015-10-30 | Discharge: 2015-10-31 | Disposition: A | Discharge status: Home / Self Care | Payer: Self-pay | Attending: Emergency Medicine | Admitting: Emergency Medicine

## 2015-10-30 DIAGNOSIS — F1010 Alcohol abuse, uncomplicated: Secondary | ICD-10-CM | POA: Insufficient documentation

## 2015-10-30 DIAGNOSIS — Z79899 Other long term (current) drug therapy: Secondary | ICD-10-CM | POA: Insufficient documentation

## 2015-10-30 DIAGNOSIS — F10239 Alcohol dependence with withdrawal, unspecified: Secondary | ICD-10-CM | POA: Diagnosis present

## 2015-10-30 DIAGNOSIS — F918 Other conduct disorders: Secondary | ICD-10-CM | POA: Insufficient documentation

## 2015-10-30 DIAGNOSIS — F17210 Nicotine dependence, cigarettes, uncomplicated: Secondary | ICD-10-CM | POA: Insufficient documentation

## 2015-10-30 DIAGNOSIS — R4589 Other symptoms and signs involving emotional state: Secondary | ICD-10-CM

## 2015-10-30 DIAGNOSIS — F1994 Other psychoactive substance use, unspecified with psychoactive substance-induced mood disorder: Secondary | ICD-10-CM | POA: Diagnosis present

## 2015-10-30 DIAGNOSIS — F10939 Alcohol use, unspecified with withdrawal, unspecified: Secondary | ICD-10-CM | POA: Diagnosis present

## 2015-10-30 DIAGNOSIS — F1020 Alcohol dependence, uncomplicated: Secondary | ICD-10-CM | POA: Diagnosis present

## 2015-10-30 DIAGNOSIS — R1013 Epigastric pain: Secondary | ICD-10-CM | POA: Insufficient documentation

## 2015-10-30 LAB — CBC
HEMATOCRIT: 42.1 % (ref 40.0–52.0)
HEMOGLOBIN: 14.3 g/dL (ref 13.0–18.0)
MCH: 33 pg (ref 26.0–34.0)
MCHC: 33.9 g/dL (ref 32.0–36.0)
MCV: 97.3 fL (ref 80.0–100.0)
Platelets: 164 10*3/uL (ref 150–440)
RBC: 4.33 MIL/uL — AB (ref 4.40–5.90)
RDW: 17.6 % — ABNORMAL HIGH (ref 11.5–14.5)
WBC: 6 10*3/uL (ref 3.8–10.6)

## 2015-10-30 LAB — COMPREHENSIVE METABOLIC PANEL
ALBUMIN: 5.4 g/dL — AB (ref 3.5–5.0)
ALK PHOS: 113 U/L (ref 38–126)
ALT: 315 U/L — ABNORMAL HIGH (ref 17–63)
AST: 505 U/L — AB (ref 15–41)
Anion gap: 20 — ABNORMAL HIGH (ref 5–15)
BILIRUBIN TOTAL: 1.5 mg/dL — AB (ref 0.3–1.2)
BUN: 7 mg/dL (ref 6–20)
CALCIUM: 9.6 mg/dL (ref 8.9–10.3)
CO2: 24 mmol/L (ref 22–32)
Chloride: 98 mmol/L — ABNORMAL LOW (ref 101–111)
Creatinine, Ser: 0.75 mg/dL (ref 0.61–1.24)
GFR calc Af Amer: >60 mL/min (ref >60)
GLUCOSE: 81 mg/dL (ref 65–99)
Potassium: 3.8 mmol/L (ref 3.5–5.1)
Sodium: 142 mmol/L (ref 135–145)
TOTAL PROTEIN: 9.8 g/dL — AB (ref 6.5–8.1)

## 2015-10-30 LAB — ETHANOL: Alcohol, Ethyl (B): 290 mg/dL — ABNORMAL HIGH (ref <5)

## 2015-10-30 LAB — URINE DRUG SCREEN, QUALITATIVE (ARMC ONLY)
AMPHETAMINES, UR SCREEN: NOT DETECTED
Barbiturates, Ur Screen: NOT DETECTED
Benzodiazepine, Ur Scrn: NOT DETECTED
COCAINE METABOLITE, UR ~~LOC~~: NOT DETECTED
Cannabinoid 50 Ng, Ur ~~LOC~~: NOT DETECTED
MDMA (ECSTASY) UR SCREEN: NOT DETECTED
METHADONE SCREEN, URINE: NOT DETECTED
OPIATE, UR SCREEN: NOT DETECTED
PHENCYCLIDINE (PCP) UR S: NOT DETECTED
Tricyclic, Ur Screen: NOT DETECTED

## 2015-10-30 LAB — LIPASE, BLOOD: Lipase: 31 U/L (ref 11–51)

## 2015-10-30 NOTE — ED Triage Notes (Signed)
Pt brought in by [place] sheriff dept.  Pt is IVC.  Pt admits to etoh use today.  Denies drug use.   Denies SI orHI Pt calm and cooperative in triage.
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
## 2015-10-30 NOTE — ED Notes (Signed)
BEHAVIORAL HEALTH ROUNDING  Patient sleeping: Yes Patient alert and oriented: Sleeping Behavior appropriate: Yes. ; If no, describe:  Nutrition and fluids offered: No, sleeping  Toileting and hygiene offered: No, sleeping  Sitter present: q15 minute observations and security monitoring  Law enforcement present: Yes ODS 

## 2015-10-30 NOTE — ED Notes (Signed)
tts in with pt 
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
## 2015-10-31 DIAGNOSIS — F1994 Other psychoactive substance use, unspecified with psychoactive substance-induced mood disorder: Secondary | ICD-10-CM

## 2015-10-31 MED ORDER — LORAZEPAM 2 MG PO TABS
0.0000 mg | ORAL_TABLET | Freq: Four times a day (QID) | ORAL | Status: DC
  Administered 2015-10-31 (×2): 2 mg via ORAL
  Filled 2015-10-31 (×2): qty 1

## 2015-10-31 MED ORDER — LORAZEPAM 2 MG PO TABS: 0.0000 mg | ORAL_TABLET | Freq: Two times a day (BID) | ORAL | Status: DC

## 2015-10-31 NOTE — ED Provider Notes (Signed)
The patient is tolerating oral oral hydration. Patient clinically sober and denies any active SI or HI. Has been eval by psychiatry he does not appear to meet any criteria or needs for inpatient admission. Patient otherwise currently stable. LFTs and electrolyte abnormalities at baseline.  Patient appropriate for follow-up.   Jerry EddyPatrick Adisa Vigeant, MD 10/31/15 [phone]
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
## 2015-10-31 NOTE — ED Notes (Signed)
Pt a/o. No complaints. Sitting in bed waiting on breakfast.

## 2015-10-31 NOTE — ED Notes (Signed)
BEHAVIORAL HEALTH ROUNDING Patient sleeping: No. Patient alert and oriented: yes Behavior appropriate: Yes.  ; If no, describe:  Nutrition and fluids offered: yes Toileting and hygiene offered: Yes  Sitter present: q15 minute observations and security  monitoring Law enforcement present: Yes  ODS  
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
## 2015-10-31 NOTE — ED Notes (Signed)
BEHAVIORAL HEALTH ROUNDING  Patient sleeping: Yes Patient alert and oriented: Sleeping Behavior appropriate: Yes. ; If no, describe:  Nutrition and fluids offered: No, sleeping  Toileting and hygiene offered: No, sleeping  Sitter present: q15 minute observations and security monitoring  Law enforcement present: Yes ODS 

## 2015-10-31 NOTE — ED Notes (Signed)
MD notified of CIWA score. MD stated that he would put orders in for CIWA meds.
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
## 2016-01-22 ENCOUNTER — Inpatient Hospital Stay
Admission: EM | Admit: 2016-01-22 | Discharge: 2016-01-25 | DRG: 439 | Disposition: A | Discharge status: Home / Self Care | Payer: Self-pay | Attending: Internal Medicine | Admitting: Internal Medicine

## 2016-01-22 ENCOUNTER — Encounter: Payer: Self-pay | Admitting: Emergency Medicine

## 2016-01-22 DIAGNOSIS — K852 Alcohol induced acute pancreatitis without necrosis or infection: Principal | ICD-10-CM | POA: Diagnosis present

## 2016-01-22 DIAGNOSIS — K859 Acute pancreatitis without necrosis or infection, unspecified: Secondary | ICD-10-CM | POA: Diagnosis present

## 2016-01-22 DIAGNOSIS — F1994 Other psychoactive substance use, unspecified with psychoactive substance-induced mood disorder: Secondary | ICD-10-CM | POA: Diagnosis present

## 2016-01-22 DIAGNOSIS — F331 Major depressive disorder, recurrent, moderate: Secondary | ICD-10-CM | POA: Diagnosis present

## 2016-01-22 DIAGNOSIS — D6959 Other secondary thrombocytopenia: Secondary | ICD-10-CM | POA: Diagnosis present

## 2016-01-22 DIAGNOSIS — F1010 Alcohol abuse, uncomplicated: Secondary | ICD-10-CM

## 2016-01-22 DIAGNOSIS — F17210 Nicotine dependence, cigarettes, uncomplicated: Secondary | ICD-10-CM | POA: Diagnosis present

## 2016-01-22 DIAGNOSIS — F319 Bipolar disorder, unspecified: Secondary | ICD-10-CM | POA: Diagnosis present

## 2016-01-22 DIAGNOSIS — Z885 Allergy status to narcotic agent status: Secondary | ICD-10-CM

## 2016-01-22 DIAGNOSIS — Z91030 Bee allergy status: Secondary | ICD-10-CM

## 2016-01-22 DIAGNOSIS — Z8249 Family history of ischemic heart disease and other diseases of the circulatory system: Secondary | ICD-10-CM

## 2016-01-22 DIAGNOSIS — K7010 Alcoholic hepatitis without ascites: Secondary | ICD-10-CM | POA: Diagnosis present

## 2016-01-22 DIAGNOSIS — Z833 Family history of diabetes mellitus: Secondary | ICD-10-CM

## 2016-01-22 DIAGNOSIS — F10230 Alcohol dependence with withdrawal, uncomplicated: Secondary | ICD-10-CM | POA: Diagnosis present

## 2016-01-22 DIAGNOSIS — Z23 Encounter for immunization: Secondary | ICD-10-CM

## 2016-01-22 HISTORY — DX: Anxiety disorder, unspecified: F41.9

## 2016-01-22 LAB — URINALYSIS, COMPLETE (UACMP) WITH MICROSCOPIC
BILIRUBIN URINE: NEGATIVE
GLUCOSE, UA: NEGATIVE mg/dL
HGB URINE DIPSTICK: NEGATIVE
Ketones, ur: 20 mg/dL — AB
LEUKOCYTES UA: NEGATIVE
NITRITE: NEGATIVE
Protein, ur: 30 mg/dL — AB
SPECIFIC GRAVITY, URINE: 1.013 (ref 1.005–1.030)
Squamous Epithelial / LPF: NONE SEEN
pH: 7 (ref 5.0–8.0)

## 2016-01-22 LAB — COMPREHENSIVE METABOLIC PANEL
ALT: 207 U/L — ABNORMAL HIGH (ref 17–63)
AST: 408 U/L — AB (ref 15–41)
Albumin: 4.8 g/dL (ref 3.5–5.0)
Alkaline Phosphatase: 70 U/L (ref 38–126)
Anion gap: 17 — ABNORMAL HIGH (ref 5–15)
BILIRUBIN TOTAL: 0.9 mg/dL (ref 0.3–1.2)
BUN: 7 mg/dL (ref 6–20)
CO2: 24 mmol/L (ref 22–32)
Calcium: 9.2 mg/dL (ref 8.9–10.3)
Chloride: 97 mmol/L — ABNORMAL LOW (ref 101–111)
Creatinine, Ser: 0.62 mg/dL (ref 0.61–1.24)
Glucose, Bld: 122 mg/dL — ABNORMAL HIGH (ref 65–99)
POTASSIUM: 3.9 mmol/L (ref 3.5–5.1)
Sodium: 138 mmol/L (ref 135–145)
TOTAL PROTEIN: 9.1 g/dL — AB (ref 6.5–8.1)

## 2016-01-22 LAB — LIPASE, BLOOD: LIPASE: 915 U/L — AB (ref 11–51)

## 2016-01-22 LAB — CBC
HEMATOCRIT: 42.7 % (ref 40.0–52.0)
Hemoglobin: 14.6 g/dL (ref 13.0–18.0)
MCH: 34.2 pg — ABNORMAL HIGH (ref 26.0–34.0)
MCHC: 34.2 g/dL (ref 32.0–36.0)
MCV: 100 fL (ref 80.0–100.0)
PLATELETS: 147 10*3/uL — AB (ref 150–440)
RBC: 4.28 MIL/uL — AB (ref 4.40–5.90)
RDW: 14 % (ref 11.5–14.5)
WBC: 8 10*3/uL (ref 3.8–10.6)

## 2016-01-22 LAB — ETHANOL: Alcohol, Ethyl (B): 129 mg/dL — ABNORMAL HIGH (ref <5)

## 2016-01-22 MED ORDER — ENOXAPARIN SODIUM 40 MG/0.4ML ~~LOC~~ SOLN
40.0000 mg | SUBCUTANEOUS | Status: DC
  Administered 2016-01-22 – 2016-01-24 (×3): 40 mg via SUBCUTANEOUS
  Filled 2016-01-22 (×3): qty 0.4

## 2016-01-22 MED ORDER — THIAMINE HCL 100 MG/ML IJ SOLN
INTRAMUSCULAR | Status: AC
  Filled 2016-01-22: qty 2

## 2016-01-22 MED ORDER — INFLUENZA VAC SPLIT QUAD 0.5 ML IM SUSY
0.5000 mL | PREFILLED_SYRINGE | INTRAMUSCULAR | Status: AC
Start: 2016-01-23 — End: 2016-01-23
  Administered 2016-01-23: 0.5 mL via INTRAMUSCULAR
  Filled 2016-01-22: qty 0.5

## 2016-01-22 MED ORDER — LORAZEPAM 1 MG PO TABS
0.0000 mg | ORAL_TABLET | Freq: Two times a day (BID) | ORAL | Status: DC
  Administered 2016-01-24: 2 mg via ORAL
  Filled 2016-01-22: qty 2

## 2016-01-22 MED ORDER — THIAMINE HCL 100 MG/ML IJ SOLN
100.0000 mg | Freq: Every day | INTRAMUSCULAR | Status: DC
  Administered 2016-01-23: 100 mg via INTRAVENOUS
  Filled 2016-01-22: qty 2

## 2016-01-22 MED ORDER — SODIUM CHLORIDE 0.9 % IV BOLUS (SEPSIS)
1000.0000 mL | INTRAVENOUS | Status: AC
  Administered 2016-01-22: 1000 mL via INTRAVENOUS

## 2016-01-22 MED ORDER — PNEUMOCOCCAL VAC POLYVALENT 25 MCG/0.5ML IJ INJ
0.5000 mL | INJECTION | INTRAMUSCULAR | Status: AC
  Administered 2016-01-23: 0.5 mL via INTRAMUSCULAR
  Filled 2016-01-22: qty 0.5

## 2016-01-22 MED ORDER — ONDANSETRON HCL 4 MG PO TABS: 4.0000 mg | ORAL_TABLET | Freq: Four times a day (QID) | ORAL | Status: DC | PRN

## 2016-01-22 MED ORDER — POTASSIUM CHLORIDE IN NACL 20-0.9 MEQ/L-% IV SOLN
INTRAVENOUS | Status: DC
  Administered 2016-01-22: 100 mL/h via INTRAVENOUS
  Administered 2016-01-23: 21:00:00 via INTRAVENOUS
  Filled 2016-01-22 (×10): qty 1000

## 2016-01-22 MED ORDER — KETOROLAC TROMETHAMINE 30 MG/ML IJ SOLN
30.0000 mg | Freq: Four times a day (QID) | INTRAMUSCULAR | Status: DC | PRN
  Administered 2016-01-22: 30 mg via INTRAVENOUS
  Filled 2016-01-22: qty 1

## 2016-01-22 MED ORDER — ONDANSETRON HCL 4 MG/2ML IJ SOLN
4.0000 mg | INTRAMUSCULAR | Status: AC
  Administered 2016-01-22: 4 mg via INTRAVENOUS

## 2016-01-22 MED ORDER — LORAZEPAM 1 MG PO TABS
0.0000 mg | ORAL_TABLET | Freq: Four times a day (QID) | ORAL | Status: AC
  Administered 2016-01-24: 2 mg via ORAL
  Filled 2016-01-22: qty 2

## 2016-01-22 MED ORDER — ONDANSETRON HCL 4 MG/2ML IJ SOLN: 4.0000 mg | Freq: Four times a day (QID) | INTRAMUSCULAR | Status: DC | PRN

## 2016-01-22 MED ORDER — THIAMINE HCL 100 MG/ML IJ SOLN
100.0000 mg | Freq: Once | INTRAMUSCULAR | Status: AC
  Administered 2016-01-22: 100 mg via INTRAVENOUS

## 2016-01-22 MED ORDER — ONDANSETRON HCL 4 MG/2ML IJ SOLN
INTRAMUSCULAR | Status: AC
  Administered 2016-01-22: 4 mg via INTRAVENOUS
  Filled 2016-01-22: qty 2

## 2016-01-22 MED ORDER — ONDANSETRON 4 MG PO TBDP
4.0000 mg | ORAL_TABLET | Freq: Once | ORAL | Status: AC | PRN
  Administered 2016-01-22: 4 mg via ORAL
  Filled 2016-01-22: qty 1

## 2016-01-22 MED ORDER — ALBUTEROL SULFATE (2.5 MG/3ML) 0.083% IN NEBU: 2.5000 mg | INHALATION_SOLUTION | RESPIRATORY_TRACT | Status: DC | PRN

## 2016-01-22 MED ORDER — HYDRALAZINE HCL 20 MG/ML IJ SOLN
10.0000 mg | Freq: Four times a day (QID) | INTRAMUSCULAR | Status: DC | PRN
  Administered 2016-01-22: 10 mg via INTRAVENOUS
  Filled 2016-01-22: qty 1

## 2016-01-22 MED ORDER — MORPHINE SULFATE (PF) 4 MG/ML IV SOLN
4.0000 mg | Freq: Once | INTRAVENOUS | Status: AC
  Administered 2016-01-22: 4 mg via INTRAVENOUS

## 2016-01-22 MED ORDER — MORPHINE SULFATE (PF) 4 MG/ML IV SOLN
4.0000 mg | INTRAVENOUS | Status: DC | PRN
  Administered 2016-01-22 – 2016-01-23 (×2): 4 mg via INTRAVENOUS
  Filled 2016-01-22 (×2): qty 1

## 2016-01-22 MED ORDER — MORPHINE SULFATE (PF) 4 MG/ML IV SOLN
INTRAVENOUS | Status: AC
  Administered 2016-01-22: 4 mg via INTRAVENOUS
  Filled 2016-01-22: qty 1

## 2016-01-22 NOTE — ED Notes (Signed)
Transporting patient to room 141-1A

## 2016-01-22 NOTE — Progress Notes (Signed)
Pt c/o left upper and mid abdominal pain with 9 to 10/10 scale unrelieved by Toradol IV. On call Dr Anne HahnWillis paged and ordered for Morphine 4mg  IV every 4hrs prn. Will administer as ordered.

## 2016-01-22 NOTE — ED Triage Notes (Addendum)
Pt presents to ED with c/o LUQ abd pain; onset upon waking this afternoon. Pt has hx of the same with recent admission to this hospital with pancreatitis. Pt reports vomiting X2 today prior to arrival. zofran and warm blanket provided in triage. Pt placed in subwiat pt comfort so that pt could lie down and ease some of his discomfort.

## 2016-01-22 NOTE — ED Notes (Signed)
Pt hx of ETOH abuse, last drink was last night. Pt states he drinks liquor everyday.

## 2016-01-22 NOTE — ED Provider Notes (Signed)
[facility] Emergency Department Provider Note  ____________________________________________   First MD Initiated Contact with Patient 01/22/16 2026     (approximate)  I have reviewed the triage vital signs and the nursing notes.   HISTORY  Chief Complaint Abdominal Pain and Emesis    HPI Jerry Dunn is a 35 y.o. male who presents for evaluation of acute onset upper and left upper abdominal pain.  He states that it occurred upon awakening this morning as soon as he ate breakfast.  He has had numerous episodes of vomiting anytime he tries to eat or drink anything today.  The pain is severe, sharp, and constant.  Movement makes it worse and eating makes it worse, nothing makes it better.  It feels consistent with his prior episode of pancreatitis.  He denies fever/chills, chest pain, shortness of breath dysuria, diarrhea.  He reports that he drinksa fifth of vodka daily and that his last drink was yesterday.  He reports having had complicated alcohol withdrawal in the past including both seizures and hallucinations (DTs).  he currently has no homicidal or suicidal ideation.   Past Medical History:  Diagnosis Date  . Alcohol abuse   . Bipolar affective ([facility])   . DTs (delirium tremens) ([facility])   . Pancreatitis     Patient Active Problem List   Diagnosis Date Noted  . Acute pancreatitis 01/22/2016  . Alcohol dependence with uncomplicated withdrawal ([facility])   . Alcohol dependence ([facility]) 05/28/2015  . Alcoholic hepatitis 05/27/2015  . Substance induced mood disorder ([facility]) 05/27/2015  . Bipolar 1 disorder ([facility]) 05/27/2015  . Delirium tremens ([facility]) 04/03/2015  . Chronic pancreatitis ([facility]) 04/03/2015  . Seizure ([facility]) 04/02/2015  . Alcoholic pancreatitis 07/31/2014  . Rectal bleeding 07/31/2014  . Alcohol withdrawal ([facility]) 07/31/2014  . Hyponatremia 07/31/2014  . Encephalopathy acute 07/31/2014  . Alcohol abuse     Past Surgical History:  Procedure  Laterality Date  . none      Prior to Admission medications   Medication Sig Start Date End Date Taking? Authorizing Provider  citalopram (CELEXA) 20 MG tablet Take 1 tablet (20 mg total) by mouth daily. For depression 05/30/15   Sanjuana KavaAgnes I Nwoko, NP  hydrOXYzine (ATARAX/VISTARIL) 25 MG tablet Take 1 tablet (25 mg) four times daily as needed: For anxiety Patient taking differently: Take 25 mg by mouth every 6 (six) hours as needed for anxiety.  05/30/15   Sanjuana KavaAgnes I Nwoko, NP  Multiple Vitamins-Minerals (MULTIVITAMIN WITH MINERALS) tablet Take 1 tablet by mouth daily. For low Vitamin 05/30/15   Sanjuana KavaAgnes I Nwoko, NP  ondansetron (ZOFRAN ODT) 4 MG disintegrating tablet Take 1 tablet (4 mg total) by mouth every 8 (eight) hours as needed for nausea or vomiting. 10/20/15   Rebecka ApleyAllison P Webster, MD  oxyCODONE-acetaminophen (ROXICET) 5-325 MG tablet Take 1 tablet by mouth every 6 (six) hours as needed. 10/20/15   Rebecka ApleyAllison P Webster, MD    Allergies Bee venom and Vicodin [hydrocodone-acetaminophen]  Family History  Problem Relation Age of Onset  . Diabetes Mother   . Hypertension Father     Social History Social History  Substance Use Topics  . Smoking status: Current Every Day Smoker    Packs/day: 1.00    Years: 16.00    Types: Cigarettes  . Smokeless tobacco: Never Used  . Alcohol use 42.0 oz/week    70 Shots of liquor per week     Comment: 10 beers daily    Review of Systems Constitutional: No fever/chills  Eyes: No visual changes. ENT: No sore throat. Cardiovascular: Denies chest pain. Respiratory: Denies shortness of breath. Gastrointestinal: acute onset severe abdominal pain with nausea and vomiting Genitourinary: Negative for dysuria. Musculoskeletal: Negative for back pain. Skin: Negative for rash. Neurological: Negative for headaches, focal weakness or numbness.  10-point ROS otherwise negative.  ____________________________________________   PHYSICAL EXAM:  VITAL SIGNS: ED Triage  Vitals  Enc Vitals Group     BP 01/22/16 1913 (!) 154/118     Pulse Rate 01/22/16 1913 82     Resp 01/22/16 1913 20     Temp 01/22/16 1913 97.8 F (36.6 C)     Temp Source 01/22/16 1913 Oral     SpO2 01/22/16 1913 100 %     Weight 01/22/16 1914 140 lb (63.5 kg)     Height 01/22/16 1914 5\' 7"  (1.702 m)     Head Circumference --      Peak Flow --      Pain Score 01/22/16 1913 10     Pain Loc --      Pain Edu? --      Excl. in GC? --     Constitutional: Alert and oriented. Appears to be in a significant amount of pain Eyes: Conjunctivae are normal. PERRL. EOMI. Head: Atraumatic. Nose: No congestion/rhinnorhea. Mouth/Throat: Mucous membranes are moist.  Oropharynx non-erythematous. Neck: No stridor.  No meningeal signs.   Cardiovascular: Normal rate, regular rhythm. Good peripheral circulation. Grossly normal heart sounds. Respiratory: Normal respiratory effort.  No retractions. Lungs CTAB. Gastrointestinal: tense with diffuse abdominal tenderness throughout, worse in the epigastrium and left upper quadrant Musculoskeletal: No lower extremity tenderness nor edema. No gross deformities of extremities. Neurologic:  Normal speech and language. No gross focal neurologic deficits are appreciated.  no tremor Skin:  Skin is warm, dry and intact. No rash noted. Psychiatric: Mood and affect are normal. Speech and behavior are normal.  ____________________________________________   LABS (all labs ordered are listed, but only abnormal results are displayed)  Labs Reviewed  LIPASE, BLOOD - Abnormal; Notable for the following:       Result Value   Lipase 915 (*)    All other components within normal limits  COMPREHENSIVE METABOLIC PANEL - Abnormal; Notable for the following:    Chloride 97 (*)    Glucose, Bld 122 (*)    Total Protein 9.1 (*)    AST 408 (*)    ALT 207 (*)    Anion gap 17 (*)    All other components within normal limits  CBC - Abnormal; Notable for the following:     RBC 4.28 (*)    MCH 34.2 (*)    Platelets 147 (*)    All other components within normal limits  URINALYSIS, COMPLETE (UACMP) WITH MICROSCOPIC  ETHANOL   ____________________________________________  EKG  None - EKG not ordered by ED physician ____________________________________________  RADIOLOGY   No results found.  ____________________________________________   PROCEDURES  Procedure(s) performed:   Procedures   Critical Care performed: No ____________________________________________   INITIAL IMPRESSION / ASSESSMENT AND PLAN / ED COURSE  Pertinent labs & imaging results that were available during my care of the patient were reviewed by me and considered in my medical decision making (see chart for details).  severe chronic alcoholic now with significantly elevated lipase and signs and symptoms consistent with pancreatitis.  No indication for imaging at this time. I put the patient on CIWA protocol and gave thiamine 100 mg IV.  Morphine and  Zofran for pain/nausea.  1L NS bolus.  Admission for further management.  ____________________________________________  FINAL CLINICAL IMPRESSION(S) / ED DIAGNOSES  Final diagnoses:  Alcohol-induced acute pancreatitis, unspecified complication status  Alcohol abuse     MEDICATIONS GIVEN DURING THIS VISIT:  Medications  LORazepam (ATIVAN) tablet 0-4 mg (not administered)    Followed by  LORazepam (ATIVAN) tablet 0-4 mg (not administered)  thiamine (B-1) injection 100 mg (not administered)  ondansetron (ZOFRAN-ODT) disintegrating tablet 4 mg (4 mg Oral Given 01/22/16 1925)  morphine 4 MG/ML injection 4 mg (4 mg Intravenous Given 01/22/16 2027)  ondansetron (ZOFRAN) injection 4 mg (4 mg Intravenous Given 01/22/16 2027)  sodium chloride 0.9 % bolus 1,000 mL (1,000 mLs Intravenous New Bag/Given 01/22/16 2026)  thiamine (B-1) injection 100 mg (100 mg Intravenous Given 01/22/16 2039)     NEW OUTPATIENT MEDICATIONS  STARTED DURING THIS VISIT:  New Prescriptions   No medications on file    Modified Medications   No medications on file    Discontinued Medications   No medications on file     Note:  This document was prepared using Dragon voice recognition software and may include unintentional dictation errors.    Loleta Roseory Ronny Ruddell, MD 01/22/16 2041

## 2016-01-22 NOTE — H&P (Signed)
[facility] - [place] at [facility]   PATIENT NAME: Jerry Dunn Brooking    MR#:  [id number]  DATE OF BIRTH:  01-25-1981  DATE OF ADMISSION:  01/22/2016  PRIMARY CARE PHYSICIAN: No PCP Per Patient   REQUESTING/REFERRING PHYSICIAN: Loleta Roseory Forbach, MD  CHIEF COMPLAINT:   Chief Complaint  Patient presents with  . Abdominal Pain  . Emesis   Abdominal pain, nausea and vomiting today. HISTORY OF PRESENT ILLNESS:  Jerry Dunn Heuer  is a 35 y.o. male with a known history of Alcohol abuse, delirium tremens and pancreatitis. He present to the ED with the above chief complaint. The abdominal pain is seen middle part of the abdomen, sharp, constant, 10 out of a 10 without radiation, associated with nausea and vomiting. The patient has no fever or chills, no diarrhea, melena or bloody stool. The last DRINKING was last night. He said he drinks a lot of alcohol. He also complains of tremors. He had complicated alcohol withdrawal in the past including seizures and hallucination.  PAST MEDICAL HISTORY:   Past Medical History:  Diagnosis Date  . Alcohol abuse   . Bipolar affective (HCC)   . DTs (delirium tremens) (HCC)   . Pancreatitis     PAST SURGICAL HISTORY:   Past Surgical History:  Procedure Laterality Date  . none      SOCIAL HISTORY:   Social History  Substance Use Topics  . Smoking status: Current Every Day Smoker    Packs/day: 1.00    Years: 16.00    Types: Cigarettes  . Smokeless tobacco: Never Used  . Alcohol use 42.0 oz/week    70 Shots of liquor per week     Comment: 10 beers daily    FAMILY HISTORY:   Family History  Problem Relation Age of Onset  . Diabetes Mother   . Hypertension Father     DRUG ALLERGIES:   Allergies  Allergen Reactions  . Bee Venom Anaphylaxis and Swelling  . Vicodin [Hydrocodone-Acetaminophen] Itching    REVIEW OF SYSTEMS:   Review of Systems  Constitutional: Positive for chills and malaise/fatigue. Negative  for fever.  HENT: Negative for congestion and sore throat.   Eyes: Negative for blurred vision and double vision.  Respiratory: Negative for cough, shortness of breath, wheezing and stridor.   Cardiovascular: Negative for chest pain and leg swelling.  Gastrointestinal: Positive for abdominal pain, nausea and vomiting. Negative for blood in stool, diarrhea and melena.  Genitourinary: Negative for dysuria, hematuria and urgency.  Musculoskeletal: Negative for back pain and joint pain.  Skin: Negative for itching and rash.  Neurological: Negative for dizziness, focal weakness and loss of consciousness.  Psychiatric/Behavioral: Negative for depression. The patient is not nervous/anxious.     MEDICATIONS AT HOME:   Prior to Admission medications   Medication Sig Start Date End Date Taking? Authorizing Provider  ibuprofen (ADVIL,MOTRIN) 200 MG tablet Take 600 mg by mouth every 6 (six) hours as needed.   Yes Historical Provider, MD  Multiple Vitamins-Minerals (MULTIVITAMIN WITH MINERALS) tablet Take 1 tablet by mouth daily. For low Vitamin 05/30/15  Yes Sanjuana KavaAgnes I Nwoko, NP  citalopram (CELEXA) 20 MG tablet Take 1 tablet (20 mg total) by mouth daily. For depression Patient not taking: Reported on 01/22/2016 05/30/15   Sanjuana KavaAgnes I Nwoko, NP  hydrOXYzine (ATARAX/VISTARIL) 25 MG tablet Take 1 tablet (25 mg) four times daily as needed: For anxiety Patient not taking: Reported on 01/22/2016 05/30/15   Sanjuana KavaAgnes I Nwoko, NP  ondansetron [facility](ZOFRAN  ODT) 4 MG disintegrating tablet Take 1 tablet (4 mg total) by mouth every 8 (eight) hours as needed for nausea or vomiting. Patient not taking: Reported on 01/22/2016 10/20/15   Rebecka ApleyAllison P Webster, MD  oxyCODONE-acetaminophen (ROXICET) 5-325 MG tablet Take 1 tablet by mouth every 6 (six) hours as needed. Patient not taking: Reported on 01/22/2016 10/20/15   Rebecka ApleyAllison P Webster, MD      VITAL SIGNS:  Blood pressure (!) 161/113, pulse 86, temperature 97.8 F (36.6 C), temperature  source Oral, resp. rate 20, height 5\' 7"  (1.702 m), weight 140 lb (63.5 kg), SpO2 100 %.  PHYSICAL EXAMINATION:  Physical Exam  GENERAL:  35 y.o.-year-old patient lying in the bed with no acute distress.  EYES: Pupils equal, round, reactive to light and accommodation. No scleral icterus. Extraocular muscles intact.  HEENT: Head atraumatic, normocephalic. Oropharynx and nasopharynx clear.  NECK:  Supple, no jugular venous distention. No thyroid enlargement, no tenderness.  LUNGS: Normal breath sounds bilaterally, no wheezing, rales,rhonchi or crepitation. No use of accessory muscles of respiration.  CARDIOVASCULAR: S1, S2 normal. No murmurs, rubs, or gallops.  ABDOMEN: Soft, tenderness in middle part of abdomen, no rigidity or rebound, nondistended. Bowel sounds present. No organomegaly or mass.  EXTREMITIES: No pedal edema, cyanosis, or clubbing.  NEUROLOGIC: Cranial nerves II through XII are intact. Muscle strength 5/5 in all extremities. Sensation intact. Gait not checked. Mild tremors of hands. PSYCHIATRIC: The patient is alert and oriented x 3.  SKIN: No obvious rash, lesion, or ulcer.   LABORATORY PANEL:   CBC  Recent Labs Lab 01/22/16 1918  WBC 8.0  HGB 14.6  HCT 42.7  PLT 147*   ------------------------------------------------------------------------------------------------------------------  Chemistries   Recent Labs Lab 01/22/16 1918  NA 138  K 3.9  CL 97*  CO2 24  GLUCOSE 122*  BUN 7  CREATININE 0.62  CALCIUM 9.2  AST 408*  ALT 207*  ALKPHOS 70  BILITOT 0.9   ------------------------------------------------------------------------------------------------------------------  Cardiac Enzymes No results for input(s): TROPONINI in the last 168 hours. ------------------------------------------------------------------------------------------------------------------  RADIOLOGY:  No results found.    IMPRESSION AND PLAN:   Acute pancreatitis. The  patient will be admitted to medical floor. Nothing by mouth except medications and water, IV fluid support, pain control and Zofran when necessary.  Alcohol abuse. Start CIWA protocol.  Abnormal liver function test. Due to alcohol abuse.  Tobacco abuse. Smoking cessation was counseled for 3 minutes, given nicotine patch.  All the records are reviewed and case discussed with ED provider. Management plans discussed with the patient, family and they are in agreement.  CODE STATUS: Full code  TOTAL TIME TAKING CARE OF THIS PATIENT: 50 minutes.    Shaune Pollackhen, Denicia Pagliarulo M.D on 01/22/2016 at 9:34 PM  Between 7am to 6pm - Pager - [phone]  After 6pm go to [email or web address] - Scientist, research (life sciences)password [email or web address]  [facility] [place] [facility]  Office  [phone]  CC: Primary care physician; No PCP Per Patient   Note: This dictation was prepared with Dragon dictation along with smaller phrase technology. Any transcriptional errors that result from this process are unintentional.

## 2016-01-23 LAB — PROTIME-INR
INR: 1.14
Prothrombin Time: 14.7 seconds (ref 11.4–15.2)

## 2016-01-23 LAB — LIPASE, BLOOD: Lipase: 783 U/L — ABNORMAL HIGH (ref 11–51)

## 2016-01-23 MED ORDER — HYDROMORPHONE HCL 1 MG/ML IJ SOLN
1.0000 mg | INTRAMUSCULAR | Status: DC | PRN
  Administered 2016-01-23 (×3): 1 mg via INTRAVENOUS
  Filled 2016-01-23 (×3): qty 1

## 2016-01-23 MED ORDER — FAMOTIDINE 20 MG PO TABS
20.0000 mg | ORAL_TABLET | Freq: Every day | ORAL | Status: DC | PRN
Start: 2016-01-23 — End: 2016-01-25
  Administered 2016-01-23 (×2): 20 mg via ORAL
  Filled 2016-01-23 (×2): qty 1

## 2016-01-23 MED ORDER — ZOLPIDEM TARTRATE 5 MG PO TABS
5.0000 mg | ORAL_TABLET | Freq: Every evening | ORAL | Status: DC | PRN
  Administered 2016-01-23 – 2016-01-24 (×3): 5 mg via ORAL
  Filled 2016-01-23 (×3): qty 1

## 2016-01-23 NOTE — Progress Notes (Signed)
[facility] [place] [facility]   PATIENT NAME: Jerry ElseWilliam Glymph    MR#:  [id number]  DATE OF BIRTH:  September 29, 1980  SUBJECTIVE:  CHIEF COMPLAINT:   Chief Complaint  Patient presents with  . Abdominal Pain  . Emesis  Patient is a 35 year old Caucasian male with past medical history significant for cervical abuse, who presents to the hospital with complaints of epigastric abdominal pain, abdominal bloating, nausea and vomiting. Admits of drinking at least once so forth, every day. He was noted to have elevated lipase and admitted to the hospital for acute pancreatitis. He feels about the same as it was yesterday, however, denies any nausea and vomiting at present. Admits of upper abdominal pain. Requests advancement of morphine to Dilaudid. CIWA scale is ranging between 1-4  Review of Systems  Constitutional: Negative for chills, fever and weight loss.  HENT: Negative for congestion.   Eyes: Negative for blurred vision and double vision.  Respiratory: Negative for cough, sputum production, shortness of breath and wheezing.   Cardiovascular: Positive for chest pain. Negative for palpitations, orthopnea, leg swelling and PND.  Gastrointestinal: Positive for abdominal pain. Negative for blood in stool, constipation, diarrhea, nausea and vomiting.  Genitourinary: Negative for dysuria, frequency, hematuria and urgency.  Musculoskeletal: Negative for falls.  Neurological: Negative for dizziness, tremors, focal weakness and headaches.  Endo/Heme/Allergies: Does not bruise/bleed easily.  Psychiatric/Behavioral: Negative for depression. The patient does not have insomnia.     VITAL SIGNS: Blood pressure (!) 147/99, pulse 84, temperature 98.4 F (36.9 C), temperature source Oral, resp. rate 16, height 5\' 7"  (1.702 m), weight 65.8 kg (145 lb 1.6 oz), SpO2 99 %.  PHYSICAL EXAMINATION:   GENERAL:  35 y.o.-year-old patient lying in the bed in mild to moderate distress  due to significant upper abdominal pain.  EYES: Pupils equal, round, reactive to light and accommodation. No scleral icterus. Extraocular muscles intact.  HEENT: Head atraumatic, normocephalic. Oropharynx and nasopharynx clear.  NECK:  Supple, no jugular venous distention. No thyroid enlargement, no tenderness.  LUNGS: Normal breath sounds bilaterally, no wheezing, rales,rhonchi or crepitation. No use of accessory muscles of respiration.  CARDIOVASCULAR: S1, S2 normal. No murmurs, rubs, or gallops.  ABDOMEN: Soft, tender diffusely, mostly in the upper abdomen, some distended. Bowel sounds present. No organomegaly or mass.  EXTREMITIES: No pedal edema, cyanosis, or clubbing.  NEUROLOGIC: Cranial nerves II through XII are intact. Muscle strength 5/5 in all extremities. Sensation intact. Gait not checked.  PSYCHIATRIC: The patient is alert and oriented x 3.  SKIN: No obvious rash, lesion, or ulcer.   ORDERS/RESULTS REVIEWED:   CBC  Recent Labs Lab 01/22/16 1918  WBC 8.0  HGB 14.6  HCT 42.7  PLT 147*  MCV 100.0  MCH 34.2*  MCHC 34.2  RDW 14.0   ------------------------------------------------------------------------------------------------------------------  Chemistries   Recent Labs Lab 01/22/16 1918  NA 138  K 3.9  CL 97*  CO2 24  GLUCOSE 122*  BUN 7  CREATININE 0.62  CALCIUM 9.2  AST 408*  ALT 207*  ALKPHOS 70  BILITOT 0.9   ------------------------------------------------------------------------------------------------------------------ estimated creatinine clearance is 118.8 mL/min (by C-G formula based on SCr of 0.62 mg/dL). ------------------------------------------------------------------------------------------------------------------ No results for input(s): TSH, T4TOTAL, T3FREE, THYROIDAB in the last 72 hours.  Invalid input(s): FREET3  Cardiac Enzymes No results for input(s): CKMB, TROPONINI, MYOGLOBIN in the last 168 hours.  Invalid input(s):  CK ------------------------------------------------------------------------------------------------------------------ Invalid input(s): POCBNP ---------------------------------------------------------------------------------------------------------------  RADIOLOGY: No results found.  EKG:  Orders placed  or performed during the hospital encounter of 04/01/15  . EKG 12-Lead  . EKG 12-Lead  . EKG    ASSESSMENT AND PLAN:  Active Problems:   Acute pancreatitis #1. Acute on chronic alcoholic pancreatitis, continue IV fluids at a high rate, pain medications, follow lipase level in the morning, keep patient nothing by mouth for now #2. Alcohol abuse with mild focal withdrawal symptoms, continue CIWA scale, follow closely clinically, get psychologist involved for further recommendations as patient is interested in stopping drinking alcohol #3. Alcoholic hepatitis, supportive therapy, follow LFTs in the morning #4. 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   DRUG ALLERGIES:  Allergies  Allergen Reactions  . Bee Venom Anaphylaxis and Swelling  . Vicodin [Hydrocodone-Acetaminophen] Itching    CODE STATUS:     Code Status Orders        Start     Ordered   01/22/16 2208  Full code  Continuous     01/22/16 2207    Code Status History    Date Active Date Inactive Code Status Order ID Comments User Context   06/02/2015  4:40 AM 06/02/2015  8:24 PM Full Code [id number]  Rebecka ApleyAllison P Webster, MD ED   05/27/2015 11:55 PM 05/31/2015 12:30 AM Full Code [id number]  Claiborne RiggZelda W Fleming, RN Inpatient   04/02/2015  4:17 AM 04/08/2015  6:16 PM Full Code [id number]  Ihor AustinPavan Pyreddy, MD ED   04/01/2015 11:35 PM 04/02/2015  4:17 AM Full Code [id number]  Rebecka ApleyAllison P Webster, MD ED   07/31/2014 10:49 AM 08/07/2014  3:19 PM Full Code [id number]  Ron ParkerHarvette C Jenkins, MD  Inpatient      TOTAL TIME TAKING CARE OF THIS PATIENT: 35 minutes.   Discussed this patient's family, all questions were answered Chanique Duca M.D on 01/23/2016 at 2:56 PM  Between 7am to 6pm - Pager - [phone]  After 6pm go to [email or web address] - password [email or web address] [email or web address]  [facility]  Office  [phone]  CC: Primary care physician; No PCP Per Patient

## 2016-01-23 NOTE — Progress Notes (Signed)
Pt c/o heartburn and unable to sleep. Pt claimed last pain medicine given "somewhat helped a bit". Dr Anne HahnWillis paged and ordered for PRN Ambien and Famotidine PO. Will administer as ordered.

## 2016-01-23 NOTE — Progress Notes (Signed)
New Admission Note:   Arrival Method: per stretcher from ED, pt came from home Mental Orientation: alert and oriented X4 Telemetry: none ordered Assessment: Completed Skin: warm, dry, intact, no preexisting wounds or skin issues noted IV: G20 on the left AC with transparent dressing, intact Pain: 10/10 scale on the left upper abdominal quadrant extending to the mid abdomen. Will administer ordered PRN pain medicine. Safety Measures: Safety Fall Prevention Plan has been given and discussed. Admission: Completed 1A Orientation: Patient has been oriented to the room, unit and staff.  Family: no family members at bedside this time  Orders have been reviewed and implemented. Will continue to monitor patient. Call light has been placed within reach.  Janice NorrieAnessa Carolanne Mercier BSN, RN [facility] 1A

## 2016-01-24 DIAGNOSIS — F331 Major depressive disorder, recurrent, moderate: Secondary | ICD-10-CM | POA: Diagnosis present

## 2016-01-24 LAB — COMPREHENSIVE METABOLIC PANEL
ALBUMIN: 3.9 g/dL (ref 3.5–5.0)
ALT: 97 U/L — ABNORMAL HIGH (ref 17–63)
ANION GAP: 11 (ref 5–15)
AST: 120 U/L — ABNORMAL HIGH (ref 15–41)
Alkaline Phosphatase: 61 U/L (ref 38–126)
BILIRUBIN TOTAL: 1.4 mg/dL — AB (ref 0.3–1.2)
BUN: 5 mg/dL — AB (ref 6–20)
CO2: 23 mmol/L (ref 22–32)
Calcium: 7.8 mg/dL — ABNORMAL LOW (ref 8.9–10.3)
Chloride: 92 mmol/L — ABNORMAL LOW (ref 101–111)
Creatinine, Ser: 0.53 mg/dL — ABNORMAL LOW (ref 0.61–1.24)
GFR calc Af Amer: >60 mL/min (ref >60)
GFR calc non Af Amer: >60 mL/min (ref >60)
GLUCOSE: 74 mg/dL (ref 65–99)
POTASSIUM: 3.9 mmol/L (ref 3.5–5.1)
SODIUM: 126 mmol/L — AB (ref 135–145)
Total Protein: 7.5 g/dL (ref 6.5–8.1)

## 2016-01-24 LAB — CBC
HEMATOCRIT: 35.8 % — AB (ref 40.0–52.0)
HEMOGLOBIN: 12.7 g/dL — AB (ref 13.0–18.0)
MCH: 34.7 pg — ABNORMAL HIGH (ref 26.0–34.0)
MCHC: 35.5 g/dL (ref 32.0–36.0)
MCV: 97.7 fL (ref 80.0–100.0)
Platelets: 76 10*3/uL — ABNORMAL LOW (ref 150–440)
RBC: 3.67 MIL/uL — ABNORMAL LOW (ref 4.40–5.90)
RDW: 13.7 % (ref 11.5–14.5)
WBC: 7.8 10*3/uL (ref 3.8–10.6)

## 2016-01-24 LAB — LIPASE, BLOOD: LIPASE: 167 U/L — AB (ref 11–51)

## 2016-01-24 MED ORDER — AMITRIPTYLINE HCL 25 MG PO TABS
25.0000 mg | ORAL_TABLET | Freq: Every day | ORAL | Status: DC
  Administered 2016-01-24: 25 mg via ORAL
  Filled 2016-01-24: qty 1

## 2016-01-24 NOTE — Progress Notes (Addendum)
[facility] - [place] at [facility]   PATIENT NAME: Jerry ElseWilliam Dunn    MR#:  [id number]  DATE OF BIRTH:  1980-09-02  SUBJECTIVE: Abdominal pain, lipase  Level decreaseing,, LFTs improved. Started on diet today. CHIEF COMPLAINT:   Chief Complaint  Patient presents with  . Abdominal Pain  . Emesis  Patient is a 35 year old Caucasian male with past medical history significant for  Alcohol  abuse, who presents to the hospital with complaints of epigastric abdominal pain, abdominal bloating, nausea and vomiting. Admits of drinking at least once so forth, every day. He was noted to have elevated lipase and admitted to the hospital for acute pancreatitis.  CIWA scale is ranging between 1-4  Review of Systems  Constitutional: Negative for chills, fever and weight loss.  HENT: Negative for congestion.   Eyes: Negative for blurred vision and double vision.  Respiratory: Negative for cough, sputum production, shortness of breath and wheezing.   Cardiovascular: Positive for chest pain. Negative for palpitations, orthopnea, leg swelling and PND.  Gastrointestinal: Positive for abdominal pain. Negative for blood in stool, constipation, diarrhea, nausea and vomiting.  Genitourinary: Negative for dysuria, frequency, hematuria and urgency.  Musculoskeletal: Negative for falls.  Neurological: Negative for dizziness, tremors, focal weakness and headaches.  Endo/Heme/Allergies: Does not bruise/bleed easily.  Psychiatric/Behavioral: Negative for depression. The patient does not have insomnia.     VITAL SIGNS: Blood pressure 131/80, pulse 86, temperature 98.4 F (36.9 C), temperature source Oral, resp. rate 18, height 5\' 7"  (1.702 m), weight 65.8 kg (145 lb 1.6 oz), SpO2 100 %.  PHYSICAL EXAMINATION:   GENERAL:  35 y.o.-year-old patient lying in the bed in mild to moderate distress due to significant upper abdominal pain.  EYES: Pupils equal, round, reactive to light and  accommodation. No scleral icterus. Extraocular muscles intact.  HEENT: Head atraumatic, normocephalic. Oropharynx and nasopharynx clear.  NECK:  Supple, no jugular venous distention. No thyroid enlargement, no tenderness.  LUNGS: Normal breath sounds bilaterally, no wheezing, rales,rhonchi or crepitation. No use of accessory muscles of respiration.  CARDIOVASCULAR: S1, S2 normal. No murmurs, rubs, or gallops.  ABDOMEN: Soft, tender diffusely, mostly in the upper abdomen, some distended. Bowel sounds present. No organomegaly or mass.  EXTREMITIES: No pedal edema, cyanosis, or clubbing.  NEUROLOGIC: Cranial nerves II through XII are intact. Muscle strength 5/5 in all extremities. Sensation intact. Gait not checked.  PSYCHIATRIC: The patient is alert and oriented x 3.  SKIN: No obvious rash, lesion, or ulcer.   ORDERS/RESULTS REVIEWED:   CBC  Recent Labs Lab 01/22/16 1918 01/24/16 0504  WBC 8.0 7.8  HGB 14.6 12.7*  HCT 42.7 35.8*  PLT 147* 76*  MCV 100.0 97.7  MCH 34.2* 34.7*  MCHC 34.2 35.5  RDW 14.0 13.7   ------------------------------------------------------------------------------------------------------------------  Chemistries   Recent Labs Lab 01/22/16 1918 01/24/16 0504  NA 138 126*  K 3.9 3.9  CL 97* 92*  CO2 24 23  GLUCOSE 122* 74  BUN 7 5*  CREATININE 0.62 0.53*  CALCIUM 9.2 7.8*  AST 408* 120*  ALT 207* 97*  ALKPHOS 70 61  BILITOT 0.9 1.4*   ------------------------------------------------------------------------------------------------------------------ estimated creatinine clearance is 118.8 mL/min (by C-G formula based on SCr of 0.53 mg/dL (L)). ------------------------------------------------------------------------------------------------------------------ No results for input(s): TSH, T4TOTAL, T3FREE, THYROIDAB in the last 72 hours.  Invalid input(s): FREET3  Cardiac Enzymes No results for input(s): CKMB, TROPONINI, MYOGLOBIN in the last 168  hours.  Invalid input(s): CK ------------------------------------------------------------------------------------------------------------------ Invalid input(s): POCBNP ---------------------------------------------------------------------------------------------------------------  RADIOLOGY: No results found.  EKG:  Orders placed or performed during the hospital encounter of 04/01/15  . EKG 12-Lead  . EKG 12-Lead  . EKG    ASSESSMENT AND PLAN:  Active Problems:   Acute pancreatitis #1. Acute on chronic alcoholic pancreatitis,  Improving, lipase  Level decreasing, LFTs improving, start on a regular diet today, decrease IV fluids, watch f today and likely discharge tomorrow  #2. Alcohol abuse with mild focal withdrawal symptoms, continue CIWA scale, follow closely clinically, says he has been into multiple alcohol abuse prevention programs.   #3. Alcoholic hepatitis, supportive therapy, follow LFTs in the morning #4. 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   DRUG ALLERGIES:  Allergies  Allergen Reactions  . Bee Venom Anaphylaxis and Swelling  . Vicodin [Hydrocodone-Acetaminophen] Itching    CODE STATUS:     Code Status Orders        Start     Ordered   01/22/16 2208  Full code  Continuous     01/22/16 2207    Code Status History    Date Active Date Inactive Code Status Order ID Comments User Context   06/02/2015  4:40 AM 06/02/2015  8:24 PM Full Code [id number]  Rebecka ApleyAllison P Webster, MD ED   05/27/2015 11:55 PM 05/31/2015 12:30 AM Full Code [id number]  Claiborne RiggZelda W Fleming, RN Inpatient   04/02/2015  4:17 AM 04/08/2015  6:16 PM Full Code [id number]  Ihor AustinPavan Pyreddy, MD ED   04/01/2015 11:35 PM 04/02/2015  4:17 AM Full Code [id number]  Rebecka ApleyAllison P Webster, MD ED   07/31/2014 10:49 AM 08/07/2014  3:19 PM Full Code [id number]  Ron ParkerHarvette C Jenkins, MD Inpatient      TOTAL TIME TAKING CARE OF THIS PATIENT: 35  minutes.   Discussed this patient's family, all questions were answered Terrelle Ruffolo M.D on 01/24/2016 at 10:32 AM  Between 7am to 6pm - Pager - [phone]  After 6pm go to [email or web address] - password [email or web address] [email or web address]  [facility]  Office  [phone]  CC: Primary care physician; No PCP Per Patient

## 2016-01-24 NOTE — Consult Note (Addendum)
[facility] Face-to-Face Psychiatry Consult   Reason for Consult:  Depression and alcoholism. Referring Physician:  Dr. Ether Griffins Patient Identification: Jerry Dunn MRN:  [id number] Principal Diagnosis: Major depressive disorder, recurrent episode, moderate ([place]) Diagnosis:   Patient Active Problem List   Diagnosis Date Noted  . Major depressive disorder, recurrent episode, moderate ([place]) [F33.1] 01/24/2016  . Acute pancreatitis [K85.90] 01/22/2016  . Alcohol dependence with uncomplicated withdrawal ([place]) [F10.230]   . Alcohol dependence ([place]) [F10.20] 05/28/2015  . Alcoholic hepatitis [A25.05] 05/27/2015  . Substance induced mood disorder ([place]) [F19.94] 05/27/2015  . Bipolar 1 disorder ([place]) [F31.9] 05/27/2015  . Delirium tremens ([place]) [F10.231] 04/03/2015  . Chronic pancreatitis ([place]) [K86.1] 04/03/2015  . Seizure ([place]) [R56.9] 04/02/2015  . Alcoholic pancreatitis [L97.67] 07/31/2014  . Rectal bleeding [K62.5] 07/31/2014  . Alcohol withdrawal ([place]) [F10.239] 07/31/2014  . Hyponatremia [E87.1] 07/31/2014  . Encephalopathy acute [G93.40] 07/31/2014  . Alcohol abuse [F10.10]     Total Time spent with patient: 45 minutes  Subjective:    Identifying data. Jerry Dunn is a 35 y.o. male with a history of depression, mood instability, and alcoholism.  Chief complaint. "I want to stop."  History of present illness. Information was obtained from the patient and the chart. The patient has a long history of alcoholism and depression. He has been treated with multiple antidepressants in the past with very little improvement. He was hospitalized a year or so ago at [facility] and was prescribed Seroquel and SSRI which he did not continue taking. He did not like the way medication made him feel. He remembers that at some point he was treated with Elavil which he tolerated very well. He found it helpful for depression, anxiety, and insomnia. The patient has been admitted numerous times for  treatment of alcohol withdrawal, alcohol withdrawal seizures, and consequences of alcoholism by pancreatitis. Recently he has been coming to the hospital when very, very sick. He does not have health insurance and hospital bills are being stressor. He does not report any meaningful periods of sobriety. He was at the substance abuse treatment center in [place] for a month in July and August. He relapsed on alcohol almost immediately following discharge. The patient reports symptoms of depression with poor sleep, decreased appetite, anhedonia, feeling of guilt and hopelessness worthlessness, poor energy and concentration. He denies suicidal or homicidal thoughts, psychosis, or symptoms suggestive of bipolar mania. He has infrequent panic attacks but no other forms of anxiety. He does not use drugs other than alcohol.  Past psychiatric history. Multiple hospitalizations for detox. He denies ever attempting suicide.  Family psychiatric history. Multiple family members with alcoholism.  Social history. He has never been married has no children. He lives alone. He works at a [employer] He has some support from his parents and sisters.  Risk to Self: Is patient at risk for suicide?: No Risk to Others:   Prior Inpatient Therapy:   Prior Outpatient Therapy:    Past Medical History:  Past Medical History:  Diagnosis Date  . Alcohol abuse   . Anxiety   . Bipolar affective ([place])   . DTs (delirium tremens) ([place])   . Pancreatitis     Past Surgical History:  Procedure Laterality Date  . none     Family History:  Family History  Problem Relation Age of Onset  . Diabetes Mother   . Hypertension Father    Social History:  History  Alcohol Use  . 42.0 oz/week  . 70 Shots of liquor  per week    Comment: 10 beers daily     History  Drug Use No    Social History   Social History  . Marital status: Single    Spouse name: N/A  . Number of children: N/A  . Years of education: N/A    Occupational History  . unemployed    Social History Main Topics  . Smoking status: Current Every Day Smoker    Packs/day: 1.00    Years: 16.00    Types: Cigarettes  . Smokeless tobacco: Never Used  . Alcohol use 42.0 oz/week    70 Shots of liquor per week     Comment: 10 beers daily  . Drug use: No  . Sexual activity: Not Currently   Other Topics Concern  . None   Social History Narrative   Lives with parents at home   Additional Social History:    Allergies:   Allergies  Allergen Reactions  . Bee Venom Anaphylaxis and Swelling  . Vicodin [Hydrocodone-Acetaminophen] Itching    Labs:  Results for orders placed or performed during the hospital encounter of 01/22/16 (from the past 48 hour(s))  Lipase, blood     Status: Abnormal   Collection Time: 01/22/16  7:18 PM  Result Value Ref Range   Lipase 915 (H) 11 - 51 U/L    Comment: RESULT CONFIRMED BY MANUAL DILUTION KLW  Comprehensive metabolic panel     Status: Abnormal   Collection Time: 01/22/16  7:18 PM  Result Value Ref Range   Sodium 138 135 - 145 mmol/L   Potassium 3.9 3.5 - 5.1 mmol/L   Chloride 97 (L) 101 - 111 mmol/L   CO2 24 22 - 32 mmol/L   Glucose, Bld 122 (H) 65 - 99 mg/dL   BUN 7 6 - 20 mg/dL   Creatinine, Ser 0.62 0.61 - 1.24 mg/dL   Calcium 9.2 8.9 - 10.3 mg/dL   Total Protein 9.1 (H) 6.5 - 8.1 g/dL   Albumin 4.8 3.5 - 5.0 g/dL   AST 408 (H) 15 - 41 U/L   ALT 207 (H) 17 - 63 U/L   Alkaline Phosphatase 70 38 - 126 U/L   Total Bilirubin 0.9 0.3 - 1.2 mg/dL   GFR calc non Af Amer >60 >60 mL/min   GFR calc Af Amer >60 >60 mL/min    Comment: (NOTE) The eGFR has been calculated using the CKD EPI equation. This calculation has not been validated in all clinical situations. eGFR's persistently <60 mL/min signify possible Chronic Kidney Disease.    Anion gap 17 (H) 5 - 15  CBC     Status: Abnormal   Collection Time: 01/22/16  7:18 PM  Result Value Ref Range   WBC 8.0 3.8 - 10.6 K/uL   RBC  4.28 (L) 4.40 - 5.90 MIL/uL   Hemoglobin 14.6 13.0 - 18.0 g/dL   HCT 42.7 40.0 - 52.0 %   MCV 100.0 80.0 - 100.0 fL   MCH 34.2 (H) 26.0 - 34.0 pg   MCHC 34.2 32.0 - 36.0 g/dL   RDW 14.0 11.5 - 14.5 %   Platelets 147 (L) 150 - 440 K/uL  Ethanol     Status: Abnormal   Collection Time: 01/22/16  7:18 PM  Result Value Ref Range   Alcohol, Ethyl (B) 129 (H) <5 mg/dL    Comment:        LOWEST DETECTABLE LIMIT FOR SERUM ALCOHOL IS 5 mg/dL FOR MEDICAL PURPOSES ONLY   Urinalysis,  Complete w Microscopic     Status: Abnormal   Collection Time: 01/22/16 10:17 PM  Result Value Ref Range   Color, Urine YELLOW (A) YELLOW   APPearance CLEAR (A) CLEAR   Specific Gravity, Urine 1.013 1.005 - 1.030   pH 7.0 5.0 - 8.0   Glucose, UA NEGATIVE NEGATIVE mg/dL   Hgb urine dipstick NEGATIVE NEGATIVE   Bilirubin Urine NEGATIVE NEGATIVE   Ketones, ur 20 (A) NEGATIVE mg/dL   Protein, ur 30 (A) NEGATIVE mg/dL   Nitrite NEGATIVE NEGATIVE   Leukocytes, UA NEGATIVE NEGATIVE   RBC / HPF 0-5 0 - 5 RBC/hpf   WBC, UA 0-5 0 - 5 WBC/hpf   Bacteria, UA RARE (A) NONE SEEN   Squamous Epithelial / LPF NONE SEEN NONE SEEN   Mucous PRESENT    Hyaline Casts, UA PRESENT   Lipase, blood     Status: Abnormal   Collection Time: 01/23/16  3:44 AM  Result Value Ref Range   Lipase 783 (H) 11 - 51 U/L    Comment: RESULT CONFIRMED BY MANUAL DILUTION. SGD  Protime-INR     Status: None   Collection Time: 01/23/16 11:29 AM  Result Value Ref Range   Prothrombin Time 14.7 11.4 - 15.2 seconds   INR 1.14   Lipase, blood     Status: Abnormal   Collection Time: 01/24/16  5:04 AM  Result Value Ref Range   Lipase 167 (H) 11 - 51 U/L  CBC     Status: Abnormal   Collection Time: 01/24/16  5:04 AM  Result Value Ref Range   WBC 7.8 3.8 - 10.6 K/uL   RBC 3.67 (L) 4.40 - 5.90 MIL/uL   Hemoglobin 12.7 (L) 13.0 - 18.0 g/dL   HCT 35.8 (L) 40.0 - 52.0 %   MCV 97.7 80.0 - 100.0 fL   MCH 34.7 (H) 26.0 - 34.0 pg   MCHC 35.5 32.0 -  36.0 g/dL   RDW 13.7 11.5 - 14.5 %   Platelets 76 (L) 150 - 440 K/uL    Comment: RESULT REPEATED AND VERIFIED  Comprehensive metabolic panel     Status: Abnormal   Collection Time: 01/24/16  5:04 AM  Result Value Ref Range   Sodium 126 (L) 135 - 145 mmol/L   Potassium 3.9 3.5 - 5.1 mmol/L   Chloride 92 (L) 101 - 111 mmol/L   CO2 23 22 - 32 mmol/L   Glucose, Bld 74 65 - 99 mg/dL   BUN 5 (L) 6 - 20 mg/dL   Creatinine, Ser 0.53 (L) 0.61 - 1.24 mg/dL   Calcium 7.8 (L) 8.9 - 10.3 mg/dL   Total Protein 7.5 6.5 - 8.1 g/dL   Albumin 3.9 3.5 - 5.0 g/dL   AST 120 (H) 15 - 41 U/L   ALT 97 (H) 17 - 63 U/L   Alkaline Phosphatase 61 38 - 126 U/L   Total Bilirubin 1.4 (H) 0.3 - 1.2 mg/dL   GFR calc non Af Amer >60 >60 mL/min   GFR calc Af Amer >60 >60 mL/min    Comment: (NOTE) The eGFR has been calculated using the CKD EPI equation. This calculation has not been validated in all clinical situations. eGFR's persistently <60 mL/min signify possible Chronic Kidney Disease.    Anion gap 11 5 - 15    Current Facility-Administered Medications  Medication Dose Route Frequency Provider Last Rate Last Dose  . albuterol (PROVENTIL) (2.5 MG/3ML) 0.083% nebulizer solution 2.5 mg  2.5 mg Nebulization  Q2H PRN Demetrios Loll, MD      . amitriptyline (ELAVIL) tablet 25 mg  25 mg Oral QHS Elise Gladden B Tieasha Larsen, MD      . enoxaparin (LOVENOX) injection 40 mg  40 mg Subcutaneous Q24H Demetrios Loll, MD   40 mg at 01/23/16 2127  . famotidine (PEPCID) tablet 20 mg  20 mg Oral Daily PRN Lance Coon, MD   20 mg at 01/23/16 1738  . hydrALAZINE (APRESOLINE) injection 10 mg  10 mg Intravenous Q6H PRN Demetrios Loll, MD   10 mg at 01/22/16 2226  . HYDROmorphone (DILAUDID) injection 1 mg  1 mg Intravenous Q4H PRN Theodoro Grist, MD   1 mg at 01/23/16 1946  . ketorolac (TORADOL) 30 MG/ML injection 30 mg  30 mg Intravenous Q6H PRN Demetrios Loll, MD   30 mg at 01/22/16 2222  . LORazepam (ATIVAN) tablet 0-4 mg  0-4 mg Oral Q6H Hinda Kehr,  MD   2 mg at 01/24/16 3086   Followed by  . LORazepam (ATIVAN) tablet 0-4 mg  0-4 mg Oral Q12H Hinda Kehr, MD      . ondansetron [facility]) tablet 4 mg  4 mg Oral Q6H PRN Demetrios Loll, MD       Or  . ondansetron [facility]) injection 4 mg  4 mg Intravenous Q6H PRN Demetrios Loll, MD      . thiamine (B-1) injection 100 mg  100 mg Intravenous Daily Hinda Kehr, MD   100 mg at 01/23/16 0915  . zolpidem (AMBIEN) tablet 5 mg  5 mg Oral QHS PRN Lance Coon, MD   5 mg at 01/23/16 2134    Musculoskeletal: Strength & Muscle Tone: within normal limits Gait & Station: normal Patient leans: N/A  Psychiatric Specialty Exam: I reviewed physical examination performed by medical provider and agree with the findings. Physical Exam  Nursing note and vitals reviewed.   Review of Systems  Psychiatric/Behavioral: Positive for depression and substance abuse. The patient has insomnia.   All other systems reviewed and are negative.   Blood pressure 131/80, pulse 86, temperature 98.4 F (36.9 C), temperature source Oral, resp. rate 18, height 5' 7"  (1.702 m), weight 65.8 kg (145 lb 1.6 oz), SpO2 100 %.Body mass index is 22.73 kg/m.  General Appearance: Casual  Eye Contact:  Good  Speech:  Clear and Coherent  Volume:  Normal  Mood:  Depressed  Affect:  Appropriate  Thought Process:  Goal Directed and Descriptions of Associations: Intact  Orientation:  Full (Time, Place, and Person)  Thought Content:  WDL  Suicidal Thoughts:  No  Homicidal Thoughts:  No  Memory:  Immediate;   Fair Recent;   Fair Remote;   Fair  Judgement:  Poor  Insight:  Shallow  Psychomotor Activity:  Normal  Concentration:  Concentration: Fair and Attention Span: Fair  Recall:  [employer] of Knowledge:  Fair  Language:  Fair  Akathisia:  No  Handed:  Right  AIMS (if indicated):     Assets:  Communication Skills Desire for Improvement Housing Resilience Social Support Vocational/Educational  ADL's:  Intact  Cognition:  WNL   Sleep:        Treatment Plan Summary: Daily contact with patient to assess and evaluate symptoms and progress in treatment and Medication management   PLAN: 1. I will start Elavil 25 mg for depression.  2. I will provide the patient with information about local IOP programs. He may not be able to participate due to his work schedule.  I will provide him with information about local substance abuse counselors.  3. The patient has never been treated with medications were recommended for alcoholism or with Antabuse. We will continue discussion tomorrow.  Disposition: No evidence of imminent risk to self or others at present.   Patient does not meet criteria for psychiatric inpatient admission. Refer to IOP. Discussed crisis plan, support from social network, calling 911, coming to the Emergency Department, and calling Suicide Hotline.  Orson Slick, MD 01/24/2016 11:50 AM

## 2016-01-25 ENCOUNTER — Inpatient Hospital Stay
Admission: EM | Admit: 2016-01-25 | Discharge: 2016-01-28 | DRG: 896 | Disposition: A | Discharge status: Home / Self Care | Payer: Self-pay | Attending: Internal Medicine | Admitting: Internal Medicine

## 2016-01-25 DIAGNOSIS — R443 Hallucinations, unspecified: Secondary | ICD-10-CM

## 2016-01-25 DIAGNOSIS — E871 Hypo-osmolality and hyponatremia: Secondary | ICD-10-CM | POA: Diagnosis present

## 2016-01-25 DIAGNOSIS — F10931 Alcohol use, unspecified with withdrawal delirium: Secondary | ICD-10-CM

## 2016-01-25 DIAGNOSIS — F10951 Alcohol use, unspecified with alcohol-induced psychotic disorder with hallucinations: Secondary | ICD-10-CM

## 2016-01-25 DIAGNOSIS — F10231 Alcohol dependence with withdrawal delirium: Principal | ICD-10-CM | POA: Diagnosis present

## 2016-01-25 DIAGNOSIS — F1994 Other psychoactive substance use, unspecified with psychoactive substance-induced mood disorder: Secondary | ICD-10-CM | POA: Diagnosis present

## 2016-01-25 DIAGNOSIS — E860 Dehydration: Secondary | ICD-10-CM | POA: Diagnosis present

## 2016-01-25 DIAGNOSIS — F10939 Alcohol use, unspecified with withdrawal, unspecified: Secondary | ICD-10-CM | POA: Diagnosis present

## 2016-01-25 DIAGNOSIS — F10239 Alcohol dependence with withdrawal, unspecified: Secondary | ICD-10-CM | POA: Diagnosis present

## 2016-01-25 DIAGNOSIS — D6959 Other secondary thrombocytopenia: Secondary | ICD-10-CM | POA: Diagnosis present

## 2016-01-25 DIAGNOSIS — F10251 Alcohol dependence with alcohol-induced psychotic disorder with hallucinations: Secondary | ICD-10-CM

## 2016-01-25 DIAGNOSIS — F339 Major depressive disorder, recurrent, unspecified: Secondary | ICD-10-CM | POA: Diagnosis present

## 2016-01-25 DIAGNOSIS — F1020 Alcohol dependence, uncomplicated: Secondary | ICD-10-CM | POA: Diagnosis present

## 2016-01-25 DIAGNOSIS — F419 Anxiety disorder, unspecified: Secondary | ICD-10-CM | POA: Diagnosis present

## 2016-01-25 DIAGNOSIS — K709 Alcoholic liver disease, unspecified: Secondary | ICD-10-CM | POA: Diagnosis present

## 2016-01-25 DIAGNOSIS — F319 Bipolar disorder, unspecified: Secondary | ICD-10-CM | POA: Diagnosis present

## 2016-01-25 DIAGNOSIS — E876 Hypokalemia: Secondary | ICD-10-CM | POA: Diagnosis present

## 2016-01-25 DIAGNOSIS — K852 Alcohol induced acute pancreatitis without necrosis or infection: Secondary | ICD-10-CM | POA: Diagnosis present

## 2016-01-25 DIAGNOSIS — Z8249 Family history of ischemic heart disease and other diseases of the circulatory system: Secondary | ICD-10-CM

## 2016-01-25 DIAGNOSIS — Z91030 Bee allergy status: Secondary | ICD-10-CM

## 2016-01-25 DIAGNOSIS — Z833 Family history of diabetes mellitus: Secondary | ICD-10-CM

## 2016-01-25 DIAGNOSIS — Z885 Allergy status to narcotic agent status: Secondary | ICD-10-CM

## 2016-01-25 DIAGNOSIS — F17210 Nicotine dependence, cigarettes, uncomplicated: Secondary | ICD-10-CM | POA: Diagnosis present

## 2016-01-25 LAB — CBC
HCT: 36.9 % — ABNORMAL LOW (ref 40.0–52.0)
HEMOGLOBIN: 12.9 g/dL — AB (ref 13.0–18.0)
MCH: 34.7 pg — AB (ref 26.0–34.0)
MCHC: 35 g/dL (ref 32.0–36.0)
MCV: 99.1 fL (ref 80.0–100.0)
PLATELETS: 81 10*3/uL — AB (ref 150–440)
RBC: 3.73 MIL/uL — AB (ref 4.40–5.90)
RDW: 13.5 % (ref 11.5–14.5)
WBC: 9.4 10*3/uL (ref 3.8–10.6)

## 2016-01-25 LAB — GLUCOSE, CAPILLARY: GLUCOSE-CAPILLARY: 147 mg/dL — AB (ref 65–99)

## 2016-01-25 LAB — COMPREHENSIVE METABOLIC PANEL
ALBUMIN: 4.3 g/dL (ref 3.5–5.0)
ALK PHOS: 69 U/L (ref 38–126)
ALT: 89 U/L — ABNORMAL HIGH (ref 17–63)
ANION GAP: 12 (ref 5–15)
AST: 99 U/L — ABNORMAL HIGH (ref 15–41)
BILIRUBIN TOTAL: 1 mg/dL (ref 0.3–1.2)
BUN: 8 mg/dL (ref 6–20)
CALCIUM: 8.5 mg/dL — AB (ref 8.9–10.3)
CO2: 24 mmol/L (ref 22–32)
Chloride: 96 mmol/L — ABNORMAL LOW (ref 101–111)
Creatinine, Ser: 0.75 mg/dL (ref 0.61–1.24)
GFR calc Af Amer: >60 mL/min (ref >60)
GLUCOSE: 94 mg/dL (ref 65–99)
POTASSIUM: 3 mmol/L — AB (ref 3.5–5.1)
Sodium: 132 mmol/L — ABNORMAL LOW (ref 135–145)
TOTAL PROTEIN: 8.6 g/dL — AB (ref 6.5–8.1)

## 2016-01-25 LAB — URINE DRUG SCREEN, QUALITATIVE (ARMC ONLY)
Amphetamines, Ur Screen: NOT DETECTED
BARBITURATES, UR SCREEN: NOT DETECTED
Benzodiazepine, Ur Scrn: POSITIVE — AB
CANNABINOID 50 NG, UR ~~LOC~~: NOT DETECTED
Cocaine Metabolite,Ur ~~LOC~~: NOT DETECTED
MDMA (ECSTASY) UR SCREEN: NOT DETECTED
METHADONE SCREEN, URINE: NOT DETECTED
Opiate, Ur Screen: NOT DETECTED
Phencyclidine (PCP) Ur S: NOT DETECTED
TRICYCLIC, UR SCREEN: POSITIVE — AB

## 2016-01-25 LAB — ETHANOL: ALCOHOL ETHYL (B): 144 mg/dL — AB (ref <5)

## 2016-01-25 MED ORDER — ACETAMINOPHEN 650 MG RE SUPP
650.0000 mg | Freq: Four times a day (QID) | RECTAL | Status: DC | PRN
Start: 2016-01-25 — End: 2016-01-28

## 2016-01-25 MED ORDER — VITAMIN B-1 100 MG PO TABS
100.0000 mg | ORAL_TABLET | Freq: Every day | ORAL | Status: DC
  Administered 2016-01-26 – 2016-01-28 (×3): 100 mg via ORAL
  Filled 2016-01-25 (×3): qty 1

## 2016-01-25 MED ORDER — FOLIC ACID 1 MG PO TABS
1.0000 mg | ORAL_TABLET | Freq: Every day | ORAL | Status: DC
  Administered 2016-01-25: 1 mg via ORAL

## 2016-01-25 MED ORDER — FOLIC ACID 1 MG PO TABS
ORAL_TABLET | ORAL | Status: AC
  Administered 2016-01-25: 1 mg via ORAL
  Filled 2016-01-25: qty 1

## 2016-01-25 MED ORDER — ADULT MULTIVITAMIN W/MINERALS CH
ORAL_TABLET | ORAL | Status: AC
  Administered 2016-01-25: 1 via ORAL
  Filled 2016-01-25: qty 1

## 2016-01-25 MED ORDER — CHLORDIAZEPOXIDE HCL 25 MG PO CAPS
25.0000 mg | ORAL_CAPSULE | Freq: Once | ORAL | Status: AC
  Administered 2016-01-25: 25 mg via ORAL
  Filled 2016-01-25: qty 1

## 2016-01-25 MED ORDER — AMITRIPTYLINE HCL 25 MG PO TABS
25.0000 mg | ORAL_TABLET | Freq: Every day | ORAL | Status: DC
Start: 2016-01-26 — End: 2016-01-28
  Administered 2016-01-26 – 2016-01-28 (×3): 25 mg via ORAL
  Filled 2016-01-25 (×3): qty 1

## 2016-01-25 MED ORDER — ONDANSETRON HCL 4 MG PO TABS: 4.0000 mg | ORAL_TABLET | Freq: Four times a day (QID) | ORAL | Status: DC | PRN

## 2016-01-25 MED ORDER — CHLORDIAZEPOXIDE HCL 25 MG PO CAPS
50.0000 mg | ORAL_CAPSULE | Freq: Three times a day (TID) | ORAL | Status: AC
  Administered 2016-01-26 – 2016-01-27 (×5): 50 mg via ORAL
  Filled 2016-01-25 (×5): qty 2

## 2016-01-25 MED ORDER — LORAZEPAM 2 MG/ML IJ SOLN
INTRAMUSCULAR | Status: AC
  Administered 2016-01-25: 2 mg
  Filled 2016-01-25: qty 1

## 2016-01-25 MED ORDER — FOLIC ACID 1 MG PO TABS
ORAL_TABLET | ORAL | Status: AC
  Filled 2016-01-25: qty 1

## 2016-01-25 MED ORDER — LORAZEPAM 1 MG PO TABS: 1.0000 mg | ORAL_TABLET | Freq: Four times a day (QID) | ORAL | Status: DC | PRN

## 2016-01-25 MED ORDER — ADULT MULTIVITAMIN W/MINERALS CH
1.0000 | ORAL_TABLET | Freq: Every day | ORAL | Status: DC
  Administered 2016-01-25 – 2016-01-28 (×4): 1 via ORAL
  Filled 2016-01-25 (×3): qty 1

## 2016-01-25 MED ORDER — LORAZEPAM 2 MG/ML IJ SOLN
0.0000 mg | Freq: Two times a day (BID) | INTRAMUSCULAR | Status: DC
  Administered 2016-01-25: 4 mg via INTRAVENOUS

## 2016-01-25 MED ORDER — ALBUTEROL SULFATE (2.5 MG/3ML) 0.083% IN NEBU: 2.5000 mg | INHALATION_SOLUTION | RESPIRATORY_TRACT | Status: DC | PRN

## 2016-01-25 MED ORDER — CHLORDIAZEPOXIDE HCL 25 MG PO CAPS
25.0000 mg | ORAL_CAPSULE | Freq: Three times a day (TID) | ORAL | Status: DC
  Administered 2016-01-25: 25 mg via ORAL
  Filled 2016-01-25: qty 1

## 2016-01-25 MED ORDER — LORAZEPAM 2 MG PO TABS
0.0000 mg | ORAL_TABLET | Freq: Two times a day (BID) | ORAL | Status: DC
Start: 2016-01-27 — End: 2016-01-28
  Administered 2016-01-27 – 2016-01-28 (×3): 4 mg via ORAL
  Filled 2016-01-25 (×3): qty 2

## 2016-01-25 MED ORDER — LORAZEPAM 2 MG/ML IJ SOLN
2.0000 mg | Freq: Once | INTRAMUSCULAR | Status: AC
  Administered 2016-01-25: 2 mg via INTRAVENOUS
  Filled 2016-01-25: qty 1

## 2016-01-25 MED ORDER — POLYETHYLENE GLYCOL 3350 17 G PO PACK: 17.0000 g | PACK | Freq: Every day | ORAL | Status: DC | PRN

## 2016-01-25 MED ORDER — LORAZEPAM 2 MG/ML IJ SOLN
INTRAMUSCULAR | Status: AC
  Filled 2016-01-25: qty 1

## 2016-01-25 MED ORDER — LORAZEPAM 2 MG/ML IJ SOLN
2.0000 mg | Freq: Once | INTRAMUSCULAR | Status: AC
  Administered 2016-01-25: 2 mg via INTRAVENOUS

## 2016-01-25 MED ORDER — FOLIC ACID 1 MG PO TABS
1.0000 mg | ORAL_TABLET | Freq: Every day | ORAL | Status: DC
  Administered 2016-01-26 – 2016-01-28 (×3): 1 mg via ORAL
  Filled 2016-01-25 (×3): qty 1

## 2016-01-25 MED ORDER — BISACODYL 10 MG RE SUPP
10.0000 mg | Freq: Every day | RECTAL | Status: DC | PRN
  Filled 2016-01-25: qty 1

## 2016-01-25 MED ORDER — LORAZEPAM 2 MG/ML IJ SOLN
1.0000 mg | Freq: Four times a day (QID) | INTRAMUSCULAR | Status: DC | PRN
  Filled 2016-01-25 (×2): qty 1

## 2016-01-25 MED ORDER — LORAZEPAM 2 MG/ML IJ SOLN
0.0000 mg | Freq: Four times a day (QID) | INTRAMUSCULAR | Status: DC
  Administered 2016-01-25: 4 mg via INTRAVENOUS
  Administered 2016-01-25: 2 mg via INTRAVENOUS
  Administered 2016-01-26: 4 mg via INTRAVENOUS
  Administered 2016-01-26: 2 mg via INTRAVENOUS
  Filled 2016-01-25 (×2): qty 2
  Filled 2016-01-25: qty 1
  Filled 2016-01-25: qty 2

## 2016-01-25 MED ORDER — LORAZEPAM 2 MG/ML IJ SOLN
2.0000 mg | Freq: Once | INTRAMUSCULAR | Status: DC
  Filled 2016-01-25: qty 1

## 2016-01-25 MED ORDER — QUETIAPINE FUMARATE ER 50 MG PO TB24
50.0000 mg | ORAL_TABLET | Freq: Once | ORAL | Status: AC
  Administered 2016-01-25: 50 mg via ORAL
  Filled 2016-01-25: qty 1

## 2016-01-25 MED ORDER — THIAMINE HCL 100 MG/ML IJ SOLN: 100.0000 mg | Freq: Every day | INTRAMUSCULAR | Status: DC

## 2016-01-25 MED ORDER — POTASSIUM CHLORIDE CRYS ER 20 MEQ PO TBCR: 40.0000 meq | EXTENDED_RELEASE_TABLET | ORAL | Status: AC

## 2016-01-25 MED ORDER — THIAMINE HCL 100 MG/ML IJ SOLN
100.0000 mg | Freq: Once | INTRAMUSCULAR | Status: AC
  Administered 2016-01-25: 100 mg via INTRAVENOUS
  Filled 2016-01-25: qty 2

## 2016-01-25 MED ORDER — SODIUM CHLORIDE 0.9% FLUSH: 3.0000 mL | Freq: Two times a day (BID) | INTRAVENOUS | Status: DC

## 2016-01-25 MED ORDER — SODIUM CHLORIDE 0.9 % IV BOLUS (SEPSIS)
1000.0000 mL | Freq: Once | INTRAVENOUS | Status: AC
  Administered 2016-01-25: 1000 mL via INTRAVENOUS

## 2016-01-25 MED ORDER — ONDANSETRON HCL 4 MG/2ML IJ SOLN: 4.0000 mg | Freq: Four times a day (QID) | INTRAMUSCULAR | Status: DC | PRN

## 2016-01-25 MED ORDER — VITAMIN B-1 100 MG PO TABS
100.0000 mg | ORAL_TABLET | Freq: Every day | ORAL | Status: DC
  Administered 2016-01-25: 100 mg via ORAL

## 2016-01-25 MED ORDER — VITAMIN B-1 100 MG PO TABS
ORAL_TABLET | ORAL | Status: AC
  Administered 2016-01-25: 100 mg via ORAL
  Filled 2016-01-25: qty 1

## 2016-01-25 MED ORDER — ACETAMINOPHEN 325 MG PO TABS: 650.0000 mg | ORAL_TABLET | Freq: Four times a day (QID) | ORAL | Status: DC | PRN

## 2016-01-25 MED ORDER — LORAZEPAM 2 MG/ML IJ SOLN
2.0000 mg | Freq: Once | INTRAMUSCULAR | Status: AC
  Administered 2016-01-25: 2 mg via INTRAMUSCULAR

## 2016-01-25 MED ORDER — LORAZEPAM 2 MG PO TABS
0.0000 mg | ORAL_TABLET | Freq: Four times a day (QID) | ORAL | Status: AC
  Administered 2016-01-25: 2 mg via ORAL
  Administered 2016-01-26 (×3): 4 mg via ORAL
  Filled 2016-01-25: qty 1
  Filled 2016-01-25 (×3): qty 2

## 2016-01-25 MED ORDER — LORAZEPAM 2 MG PO TABS
ORAL_TABLET | ORAL | Status: AC
  Administered 2016-01-25: 2 mg
  Filled 2016-01-25: qty 1

## 2016-01-25 MED ORDER — POTASSIUM CHLORIDE IN NACL 20-0.9 MEQ/L-% IV SOLN
INTRAVENOUS | Status: DC
  Administered 2016-01-25 – 2016-01-28 (×5): via INTRAVENOUS
  Filled 2016-01-25 (×11): qty 1000

## 2016-01-25 MED ORDER — POTASSIUM CHLORIDE CRYS ER 20 MEQ PO TBCR
40.0000 meq | EXTENDED_RELEASE_TABLET | Freq: Once | ORAL | Status: AC
  Administered 2016-01-25: 40 meq via ORAL
  Filled 2016-01-25: qty 2

## 2016-01-25 NOTE — ED Triage Notes (Signed)
Pt admitted to hospital for pancreatitis. Pt eloped and went hope. Reports "all of my labs were fine and I felt fine." Per mother, once pt got home, was not acting right. Per mother, pt was seeing cats under the bed and there were no cats. Pt was given 1 mg ativan at home.

## 2016-01-25 NOTE — ED Notes (Signed)
Patient continues restless, wandering around, checking all doors. He repeatedly knocked on window; when told to stop he said "no." Unclear if behaviors are volitional or a result of withdrawal. No noted response from medication. Maintained on 15 minute checks and observation by security camera for safety.

## 2016-01-25 NOTE — ED Notes (Signed)
Patient given lunch tray. Appetite good. Maintained on 15 minute checks and observation by security camera for safety.

## 2016-01-25 NOTE — ED Notes (Signed)

## 2016-01-25 NOTE — ED Notes (Signed)
Patient restless and somewhat confused. He thought he was at his grandmother's house but knew he was in [place]. Did take fluids with encouragement. Still appears to be responding to internal stimuli. CIWA at 17, medicated per protocol.

## 2016-01-25 NOTE — ED Provider Notes (Signed)
[facility] Emergency Department Provider Note   ____________________________________________    I have reviewed the triage vital signs and the nursing notes.   HISTORY  Chief Complaint Hallucinations    HPI Jerry Dunn is a 35 y.o. male who was recently admitted to the hospital for pancreatitis due to alcohol abuse. He apparently drinks a fifth of liquor daily. Overnight at approximately 5 AM he eloped and went home. Apparently the mother reports she was concerned because he appeared to be hallucinating and was "chasing cats" and they do not have any cats. Father brought the patient back to the emergency department. Currently the patient is calm and has no complaints. He is convinced there was a cat underneath his bed.    Past Medical History:  Diagnosis Date  . Alcohol abuse   . Anxiety   . Bipolar affective (HCC)   . DTs (delirium tremens) (HCC)   . Pancreatitis     Patient Active Problem List   Diagnosis Date Noted  . Major depressive disorder, recurrent episode, moderate (HCC) 01/24/2016  . Acute pancreatitis 01/22/2016  . Alcohol dependence with uncomplicated withdrawal ([facility])   . Alcohol dependence (HCC) 05/28/2015  . Alcoholic hepatitis 05/27/2015  . Substance induced mood disorder (HCC) 05/27/2015  . Bipolar 1 disorder (HCC) 05/27/2015  . Delirium tremens (HCC) 04/03/2015  . Chronic pancreatitis (HCC) 04/03/2015  . Seizure (HCC) 04/02/2015  . Alcoholic pancreatitis 07/31/2014  . Rectal bleeding 07/31/2014  . Alcohol withdrawal (HCC) 07/31/2014  . Hyponatremia 07/31/2014  . Encephalopathy acute 07/31/2014  . Alcohol abuse     Past Surgical History:  Procedure Laterality Date  . none      Prior to Admission medications   Medication Sig Start Date End Date Taking? Authorizing Provider  citalopram (CELEXA) 20 MG tablet Take 1 tablet (20 mg total) by mouth daily. For depression Patient not taking: Reported on 01/22/2016  05/30/15   Sanjuana KavaAgnes I Nwoko, NP  hydrOXYzine (ATARAX/VISTARIL) 25 MG tablet Take 1 tablet (25 mg) four times daily as needed: For anxiety Patient not taking: Reported on 01/22/2016 05/30/15   Sanjuana KavaAgnes I Nwoko, NP  ibuprofen (ADVIL,MOTRIN) 200 MG tablet Take 600 mg by mouth every 6 (six) hours as needed.    Historical Provider, MD  Multiple Vitamins-Minerals (MULTIVITAMIN WITH MINERALS) tablet Take 1 tablet by mouth daily. For low Vitamin 05/30/15   Sanjuana KavaAgnes I Nwoko, NP  ondansetron (ZOFRAN ODT) 4 MG disintegrating tablet Take 1 tablet (4 mg total) by mouth every 8 (eight) hours as needed for nausea or vomiting. Patient not taking: Reported on 01/22/2016 10/20/15   Rebecka ApleyAllison P Webster, MD  oxyCODONE-acetaminophen (ROXICET) 5-325 MG tablet Take 1 tablet by mouth every 6 (six) hours as needed. Patient not taking: Reported on 01/22/2016 10/20/15   Rebecka ApleyAllison P Webster, MD     Allergies Bee venom and Vicodin [hydrocodone-acetaminophen]  Family History  Problem Relation Age of Onset  . Diabetes Mother   . Hypertension Father     Social History Social History  Substance Use Topics  . Smoking status: Current Every Day Smoker    Packs/day: 1.00    Years: 16.00    Types: Cigarettes  . Smokeless tobacco: Never Used  . Alcohol use 42.0 oz/week    70 Shots of liquor per week     Comment: 10 beers daily    Review of Systems  Constitutional: No fever/chills Eyes: No visual changes.   Cardiovascular: Denies chest pain.No palpitations Respiratory: Denies shortness of breath.  Gastrointestinal: No abdominal pain.  No nausea, no vomiting.    Musculoskeletal: Negative for back pain.  Neurological: Negative for headaches or weakness  10-point ROS otherwise negative.  ____________________________________________   PHYSICAL EXAM:  VITAL SIGNS: ED Triage Vitals  Enc Vitals Group     BP      Pulse      Resp      Temp      Temp src      SpO2      Weight      Height      Head Circumference      Peak  Flow      Pain Score      Pain Loc      Pain Edu?      Excl. in GC?     Constitutional: Alert and oriented. No acute distress. Pleasant and interactive Eyes: Conjunctivae are normal.  Head: Atraumatic. Nose: No congestion/rhinnorhea. Mouth/Throat: Mucous membranes are moist.   Neck:  Painless ROM Cardiovascular: Normal rate, regular rhythm. Grossly normal heart sounds.  Good peripheral circulation. Respiratory: Normal respiratory effort.  No retractions. Lungs CTAB. Gastrointestinal: Soft and nontender. No distention.  No CVA tenderness. Genitourinary: deferred Musculoskeletal: No lower extremity tenderness nor edema.  Warm and well perfused Neurologic:  Normal speech and language. No gross focal neurologic deficits are appreciated.  Skin:  Skin is warm, dry and intact. No rash noted. Psychiatric: Patient is convinced there was a cat underneath his bed but otherwise is calm with appropriate mood  ____________________________________________   LABS (all labs ordered are listed, but only abnormal results are displayed)  Labs Reviewed - No data to display ____________________________________________  EKG  None ____________________________________________  RADIOLOGY  None ____________________________________________   PROCEDURES  Procedure(s) performed: No    Critical Care performed: No ____________________________________________   INITIAL IMPRESSION / ASSESSMENT AND PLAN / ED COURSE  Pertinent labs & imaging results that were available during my care of the patient were reviewed by me and considered in my medical decision making (see chart for details).  Patient overall well-appearing and in no distress. He may be hallucinating. We'll discuss further with parents  Clinical Course   ----------------------------------------- 8:14 AM on 01/25/2016 -----------------------------------------  Discussed with mother who reports that the patient was tearing the  house apart "looking for cats "and they have no cats. She strongly feels he is hallucinating and needs IVC.  Given this discussion I have placed the patient under IVC and consulted psychiatry ____________________________________________ Tele psych agrees with continuing IVC.   FINAL CLINICAL IMPRESSION(S) / ED DIAGNOSES  Hallucinations   NEW MEDICATIONS STARTED DURING THIS VISIT:  New Prescriptions   No medications on file     Note:  This document was prepared using Dragon voice recognition software and may include unintentional dictation errors.    Jene Everyobert Airanna Partin, MD 01/25/16 [phone]

## 2016-01-25 NOTE — ED Notes (Signed)
Pt ripped out IV. Will try to re-establish IV.

## 2016-01-25 NOTE — ED Notes (Signed)
Patient noticed standing on bed trying to get to overhead light. He appears to be actively hallucinating. States there is water dripping from his ceiling. Slow to respond to verbal redirection. Difficult to reorient to reality.  Patient encouraged to sit in common area so that staff could maintain closer observation.

## 2016-01-25 NOTE — ED Notes (Signed)
Patient wandering around unit in aimless manner but able to redirected. Appears confused on initial conversation. Currently meeting with TTS for assessment. Maintained on 15 minute checks and observation by security camera for safety.

## 2016-01-25 NOTE — ED Notes (Signed)
Spoke with Dr Anne HahnWillis and Dr Fanny BienQuale about patient and new order received for ativan 2mg  IV at this time.

## 2016-01-25 NOTE — ED Notes (Signed)
Report from nurse on 1A who was pt's primary nurse last night; Pt was admitted to room 141 on 01/22/16 for pancreatitis; RN reports pt was ambulatory on unit and would go outside periodically;   during her rounding she noticed pt had left the unit and been gone for about an hour but had not returned; she says she called the pt's home and his mother said pt was actually at home; mother was concerned as pt was exhibiting bizarre behavior; says pt was looking for cats under the bed, and they have no cats; pt was brought back to the ED by father for evaluation and re-admittance if necessary; pt calm and cooperative;

## 2016-01-25 NOTE — ED Notes (Signed)
Patient alert, oriented to name and place. He is very guarded on interview. Patient says his parents brought him to the hospital because of "spite." Mood is anxious, paranoid. Patient denies SI or HI. + visual and auditory hallucinations. Thoughts are mildly disorganized with some LOA.  CIWA done per MD order. Patient cooperative with taking oral medications.  Maintained on 15 minute checks and observation by security camera for safety.

## 2016-01-25 NOTE — ED Notes (Signed)
[facility]

## 2016-01-25 NOTE — ED Notes (Signed)
Patient still very restless and unable to follow redirection.  RN put snack and beverage on a chair for patient.Maintained on 15 minute checks and observation by security camera for safety.

## 2016-01-25 NOTE — ED Notes (Signed)
Report called to RN on 1A.

## 2016-01-25 NOTE — ED Notes (Addendum)
Pt belongings in bag: clothing and sneakers. Wallet (131 dollars) and cell phone given to PD to be locked up. Key #9 in ED main pyxis.

## 2016-01-25 NOTE — BH Assessment (Signed)
Assessment Note  Jerry Dunn is an 35 y.o. male. Jerry Dunn was unsure of how he arrived to the ED and stated that he would have to look into that.  He report symptoms of depression.  He states that he sleeps more and "spend more time reading and writing and focusing on his own goals", as signs that he knows that he is depressed. He reports anxiety stating that "he grows 8 arms, gets fat. (Patient began to giggle). He denied suicidal or homicidal ideation or intent. He reports that he has been under financial stress, work, school, automobile, and social. He was unable to identify what pressures he is facing.  He reports use of vodka most days (6 days).  He reports that he works, but could not tell where. He was further unable to say how many hours a week he works. He denied having auditory or visual hallucinations. During the assessment, Jerry Dunn  would respond to internal stimulation.  On multiple occasions, Jerry Dunn would reach into the air and grab something, attempt to manipulate what he grabbed, and would ask the TTS if she saw the item or if she wanted the item.  He was unable to maintain a train of thought and would often wander into random topics midsentence.   Diagnosis: Visual Hallucinations, Alcohol Use Disorder  Past Medical History:  Past Medical History:  Diagnosis Date  . Alcohol abuse   . Anxiety   . Bipolar affective (HCC)   . DTs (delirium tremens) (HCC)   . Pancreatitis     Past Surgical History:  Procedure Laterality Date  . none      Family History:  Family History  Problem Relation Age of Onset  . Diabetes Mother   . Hypertension Father     Social History:  reports that he has been smoking Cigarettes.  He has a 16.00 pack-year smoking history. He has never used smokeless tobacco. He reports that he drinks about 42.0 oz of alcohol per week . He reports that he does not use drugs.  Additional Social History:  Alcohol / Drug Use History of alcohol  / drug use?: Yes Substance #1 Name of Substance 1: Vodka or Whiskey 1 - Age of First Use: 15 1 - Amount (size/oz): a pint to a fifth of whiskey or vodka 1 - Frequency: 6 days a week 1 - Last Use / Amount: 01/21/2016  CIWA: CIWA-Ar BP: 127/87 Pulse Rate: (!) 108 Nausea and Vomiting: no nausea and no vomiting Tactile Disturbances: none Tremor: moderate, with patient's arms extended Auditory Disturbances: not present Paroxysmal Sweats: no sweat visible Visual Disturbances: moderately severe hallucinations Anxiety: moderately anxious, or guarded, so anxiety is inferred Headache, Fullness in Head: none present Agitation: normal activity Orientation and Clouding of Sensorium: cannot do serial additions or is uncertain about date CIWA-Ar Total: 13 COWS:    Allergies:  Allergies  Allergen Reactions  . Bee Venom Anaphylaxis and Swelling  . Vicodin [Hydrocodone-Acetaminophen] Itching    Home Medications:  (Not in a hospital admission)  OB/GYN Status:  No LMP for male patient.  General Assessment Data Location of Assessment: [facility] ED TTS Assessment: In system Is this a Tele or Face-to-Face Assessment?: Face-to-Face Is this an Initial Assessment or a Re-assessment for this encounter?: Initial Assessment Marital status: Single Maiden name: n/a Is patient pregnant?: No Pregnancy Status: No Living Arrangements: Parent Can pt return to current living arrangement?: Yes Admission Status: Involuntary Is patient capable of signing voluntary admission?: No Referral Source: Self/Family/Friend  Insurance type: None  Medical Screening Exam [facility] Walk-in ONLY) Medical Exam completed: Yes  Crisis Care Plan Living Arrangements: Parent Legal Guardian: Other: (Self) Name of Psychiatrist: Dr. Bard HerbertMoffit Name of Therapist: None  Education Status Is patient currently in school?: No Current Grade: n/a Highest grade of school patient has completed: College Name of school: [employer] Contact  person: n/a  Risk to self with the past 6 months Suicidal Ideation: No Has patient been a risk to self within the past 6 months prior to admission? : No Suicidal Intent: No Has patient had any suicidal intent within the past 6 months prior to admission? : No Is patient at risk for suicide?: No Suicidal Plan?: No Has patient had any suicidal plan within the past 6 months prior to admission? : No Access to Means: No What has been your use of drugs/alcohol within the last 12 months?: Use of alcohol daily Previous Attempts/Gestures: No How many times?: 0 Other Self Harm Risks: denied /  Alcohol Use Triggers for Past Attempts: None known Intentional Self Injurious Behavior: None Family Suicide History: Unknown Recent stressful life event(s): Job Loss, Financial Problems Persecutory voices/beliefs?: No Depression: No Depression Symptoms:  (None identified) Substance abuse history and/or treatment for substance abuse?: Yes Suicide prevention information given to non-admitted patients: Not applicable  Risk to Others within the past 6 months Homicidal Ideation: No Does patient have any lifetime risk of violence toward others beyond the six months prior to admission? : No Thoughts of Harm to Others: No Current Homicidal Intent: No Current Homicidal Plan: No Access to Homicidal Means: No Identified Victim: None identified History of harm to others?: No Assessment of Violence: None Noted Does patient have access to weapons?: Yes (Comment) (Knives and guns - per patient report) Criminal Charges Pending?: No Does patient have a court date: No Is patient on probation?: No  Psychosis Hallucinations: Visual Delusions: None noted  Mental Status Report Appearance/Hygiene: In scrubs Eye Contact: Fair Motor Activity: Freedom of movement Speech: Incoherent, Tangential Level of Consciousness: Alert Mood: Pleasant Affect: Appropriate to circumstance Anxiety Level: None Thought Processes:  Flight of Ideas, Tangential, Irrelevant Judgement: Unable to Assess Orientation: Person (Could identify TTS, but not himself) Obsessive Compulsive Thoughts/Behaviors: None  Cognitive Functioning Concentration: Poor Memory: Unable to Assess IQ: Average Insight: Unable to Assess Impulse Control: Unable to Assess Appetite: Fair Sleep: Decreased Vegetative Symptoms: None  ADLScreening [facility] Assessment Services) Patient's cognitive ability adequate to safely complete daily activities?: Yes Patient able to express need for assistance with ADLs?: Yes Independently performs ADLs?: Yes (appropriate for developmental age)  Prior Inpatient Therapy Prior Inpatient Therapy: No (Patient unable to answer) Prior Therapy Dates: n/a Prior Therapy Facilty/Provider(s): n/a Reason for Treatment: n/a  Prior Outpatient Therapy Prior Outpatient Therapy: Yes Prior Therapy Dates: Current Prior Therapy Facilty/Provider(s): [facility] Reason for Treatment: Depression Does patient have an ACCT team?: No Does patient have Intensive In-House Services?  : No Does patient have [facility] services? : No Does patient have P4CC services?: No  ADL Screening (condition at time of admission) Patient's cognitive ability adequate to safely complete daily activities?: Yes Patient able to express need for assistance with ADLs?: Yes Independently performs ADLs?: Yes (appropriate for developmental age)       Abuse/Neglect Assessment (Assessment to be complete while patient is alone) Physical Abuse: Denies Verbal Abuse: Denies Sexual Abuse: Denies Exploitation of patient/patient's resources: Denies Self-Neglect: Denies     Merchant navy officerAdvance Directives (For Healthcare) Does Patient Have a Medical Advance Directive?: No  Additional Information 1:1 In Past 12 Months?: No CIRT Risk: No Elopement Risk: No Does patient have medical clearance?: Yes     Disposition:  Disposition Initial Assessment Completed for this  Encounter: Yes Disposition of Patient: Other dispositions  On Site Evaluation by:   Reviewed with Physician:    Justice DeedsKeisha Bayden Gil 01/25/2016 12:42 PM

## 2016-01-25 NOTE — ED Provider Notes (Signed)
Vitals:   01/25/16 1835 01/25/16 1838  BP: 108/78 108/78  Pulse: (!) 111 (!) 111  Resp:  20  Temp:       Patient and a renal health unit, has began having additional shaking, worsening hallucinations. Patient's current withdrawal score is 12, patient moved back to the main ED, given additional 2 mg Ativan IV, he is awake and alert, but does demonstrate ongoing tremulousness, dilated pupils, and mild active hallucinations asking and talking to his father in the room. Who is not present.  Patient will be admitted for ongoing treatment, medical care for what appears to be acute drawl and delirium tremens additional thiamine ordered as well   Sharyn CreamerMark Quale, MD 01/25/16 [phone]

## 2016-01-25 NOTE — ED Notes (Signed)
Pt becoming more restless. Will assess CIWA and administer medications as needed.

## 2016-01-25 NOTE — ED Notes (Signed)
BEHAVIORAL HEALTH ROUNDING  Patient sleeping: No.  Patient alert and oriented: yes  Behavior appropriate: No. ; If no, describe: restless, picking at clothing, pulling off monitor leads.  Nutrition and fluids offered: Yes  Toileting and hygiene offered: Yes  Sitter present: safety sitter at bedside, Q 15 min safety rounds and observation.  Law enforcement present: Yes ODS

## 2016-01-25 NOTE — ED Notes (Signed)
[facility]

## 2016-01-25 NOTE — ED Notes (Signed)
SOC at bedside. 

## 2016-01-25 NOTE — H&P (Signed)
Pt. Became missing from room. Code walker called. Called emergency contact and discovered pt. Was at his mothers home. Spoke with pt. And mother. Suggested pt. Come back to hospital. Spoke with dr. Sheryle Hailiamond and if he returns to emergency room he will be involuntary committed.

## 2016-01-25 NOTE — ED Notes (Addendum)
ENVIRONMENTAL ASSESSMENT  Potentially harmful objects out of patient reach: Yes.  Personal belongings secured: Yes.  Patient dressed in hospital provided attire only: Yes.  Plastic bags out of patient reach: Yes.  Patient care equipment (cords, cables, call bells, lines, and drains) shortened, removed, or accounted for: Yes.  Equipment and supplies removed from bottom of stretcher: Yes.  Potentially toxic materials out of patient reach: Yes.  Sharps container removed or out of patient reach: Yes.   BEHAVIORAL HEALTH ROUNDING  Patient sleeping: No.  Patient alert and oriented: yes  Behavior appropriate: Yes. ; If no, describe:  Nutrition and fluids offered: Yes  Toileting and hygiene offered: Yes  Sitter present: safety sitter at bedside  , Q 15 min safety rounds and observation.  Law enforcement present: Yes ODS  Pt calm at this time. Answering question appropriately.

## 2016-01-25 NOTE — Progress Notes (Signed)
Patient arrived to floor, aggressive, non compliant, shows increased signs of withdrawal.   Hallucinating and constantly trying to get out of bed.  Prn ativan given with no positive changes.  Spoke with MD. Oralia Manisavid Willis to increase librium and reassess.  Sitter at bedside.

## 2016-01-25 NOTE — ED Notes (Signed)
Patient appears grossly psychotic. States there are "inches of water" in his room. Patient dragged his mattress out of the room "to dry off."  It looks like patient spilled a cup of water in his room.  Patient opening doors to other patient rooms, stating his family are in the rooms. Very restless. Patient unable to respond to any reality orientation.  Patient received emergency dose of Ativan per MD order to assist with behavioral control and reduction of any ETOH withdrawal sxs.

## 2016-01-25 NOTE — ED Notes (Signed)
Patient transferred back to quad area of the ED. Appears to be rapidly deteriorating with elevated pulse, confusion, paranoia, and aggression.  Report given to RipleyFelicia, [place]RN

## 2016-01-25 NOTE — ED Notes (Signed)
Patient took mattress of the bed and is standing on the bed frame. He was told to get down for safety reasons.  Maintained on 15 minute checks and observation by security camera for safety.

## 2016-01-25 NOTE — H&P (Signed)
[facility] [place] [facility]   PATIENT NAME: Jerry Dunn    MR#:  [id number]  DATE OF BIRTH:  [birth year]  DATE OF ADMISSION:  01/25/2016  PRIMARY CARE PHYSICIAN: No PCP Per Patient   REQUESTING/REFERRING PHYSICIAN: Dr. Fanny BienQuale  CHIEF COMPLAINT:   Chief Complaint  Patient presents with  . Hallucinations    HISTORY OF PRESENT ILLNESS:  Jerry Dunn to the hospital for acute pancreatitis and alcohol withdrawal had eloped from the hospital yesterday. He was found to be acting bizarre by family and EMS called. Patient has been in the emergency room with involuntary commitment. He has been noticed to have delirium tremens with significant hallucinations needing high doses of IV Ativan and is being Dunn to the hospital.  He is confused at this time. Poor historian. Does not complain of any abdominal pain. Tells me he just got out of the hospital after having multiple dental surgeries.   PAST MEDICAL HISTORY:   Past Medical History:  Diagnosis Date  . Alcohol abuse   . Anxiety   . Bipolar affective (HCC)   . DTs (delirium tremens) (HCC)   . Pancreatitis     PAST SURGICAL HISTORY:   Past Surgical History:  Procedure Laterality Date  . none      SOCIAL HISTORY:   Social History  Substance Use Topics  . Smoking status: Current Every Day Smoker    Packs/day: 1.00    Years: 16.00    Types: Cigarettes  . Smokeless tobacco: Never Used  . Alcohol use 42.0 oz/week    70 Shots of liquor per week     Comment: 10 beers daily    FAMILY HISTORY:   Family History  Problem Relation Age of Onset  . Diabetes Mother   . Hypertension Father     DRUG ALLERGIES:   Allergies  Allergen Reactions  . Bee Venom Anaphylaxis and Swelling  . Vicodin [Hydrocodone-Acetaminophen] Itching    REVIEW OF SYSTEMS:   Review of Systems  Unable to perform ROS:  Mental status change    MEDICATIONS AT HOME:   Prior to Admission medications   Medication Sig Start Date End Date Taking? Authorizing Provider  Multiple Vitamins-Minerals (MULTIVITAMIN WITH MINERALS) tablet Take 1 tablet by mouth daily. For low Vitamin 05/30/15  Yes Sanjuana KavaAgnes I Nwoko, NP  amitriptyline (ELAVIL) 25 MG tablet Take 25 mg by mouth daily.    Historical Provider, MD  citalopram (CELEXA) 20 MG tablet Take 1 tablet (20 mg total) by mouth daily. For depression Patient not taking: Reported on 01/25/2016 05/30/15   Sanjuana KavaAgnes I Nwoko, NP  hydrOXYzine (ATARAX/VISTARIL) 25 MG tablet Take 1 tablet (25 mg) four times daily as needed: For anxiety Patient not taking: Reported on 01/25/2016 05/30/15   Sanjuana KavaAgnes I Nwoko, NP  ibuprofen (ADVIL,MOTRIN) 200 MG tablet Take 600 mg by mouth every 6 (six) hours as needed.    Historical Provider, MD  ondansetron (ZOFRAN ODT) 4 MG disintegrating tablet Take 1 tablet (4 mg total) by mouth every 8 (eight) hours as needed for nausea or vomiting. Patient not taking: Reported on 01/25/2016 10/20/15   Rebecka ApleyAllison P Webster, MD  oxyCODONE-acetaminophen (ROXICET) 5-325 MG tablet Take 1 tablet by mouth every 6 (six) hours as needed. Patient not taking: Reported on 01/25/2016 10/20/15   Rebecka ApleyAllison P Webster, MD     VITAL SIGNS:  Blood pressure 108/78, pulse Marland Kitchen(!)  111, temperature 98.4 F (36.9 C), temperature source Oral, resp. rate 20, height 5\' 7"  (1.702 m), weight 65.8 kg (145 lb), SpO2 100 %.  PHYSICAL EXAMINATION:  Physical Exam  GENERAL:  35 y.o.-year-old patient lying in the bed, Restless EYES: Pupils equal, round, reactive to light and accommodation. No scleral icterus. Extraocular muscles intact.  HEENT: Head atraumatic, normocephalic. Oropharynx and nasopharynx clear. No oropharyngeal erythema, moist oral mucosa  NECK:  Supple, no jugular venous distention. No thyroid enlargement, no tenderness.  LUNGS: Normal breath sounds bilaterally, no wheezing, rales, rhonchi. No use of  accessory muscles of respiration.  CARDIOVASCULAR: S1, S2 normal. No murmurs, rubs, or gallops. Tachycardia ABDOMEN: Soft, nontender, nondistended. Bowel sounds present. No organomegaly or mass.  EXTREMITIES: No pedal edema, cyanosis, or clubbing. + 2 pedal & radial pulses b/l.   NEUROLOGIC: Cranial nerves II through XII are intact. No focal Motor or sensory deficits appreciated b/l PSYCHIATRIC: The patient is alert and awake. Restless and confused. SKIN: No obvious rash, lesion, or ulcer.   LABORATORY PANEL:   CBC  Recent Labs Lab 01/25/16 0759  WBC 9.4  HGB 12.9*  HCT 36.9*  PLT 81*   ------------------------------------------------------------------------------------------------------------------  Chemistries   Recent Labs Lab 01/25/16 0759  NA 132*  K 3.0*  CL 96*  CO2 24  GLUCOSE 94  BUN 8  CREATININE 0.75  CALCIUM 8.5*  AST 99*  ALT 89*  ALKPHOS 69  BILITOT 1.0   ------------------------------------------------------------------------------------------------------------------  Cardiac Enzymes No results for input(s): TROPONINI in the last 168 hours. ------------------------------------------------------------------------------------------------------------------  RADIOLOGY:  No results found.   IMPRESSION AND PLAN:   * Delirium tremens/ alcohol withdrawal Start CIWA protocol. We'll put him on scheduled Librium 25 mg 3 times a day. Added 6 doses. Thiamine and folic acid orally. IV fluids. [facility]. Patient has IVC in place. Sitter.  * Acute pancreatitis, alcoholic Lipase had trended down significantly prior to patient leaving the hospital. No abdominal pain or tenderness at this time.  * Hyponatremia likely from dehydration. Start IV fluids.  * Hypokalemia. We will replace orally. Add potassium supplement to IV fluids.  * Thrombocytopenia secondary to alcohol. Monitor. No bleeding.  * DVT prophylaxis with SCDs.  All the records  are reviewed and case discussed with ED provider. Management plans discussed with the patient, family and they are in agreement.  CODE STATUS: FULL CODE  TOTAL TIME TAKING CARE OF THIS PATIENT: 40 minutes.   Milagros LollSudini, Ireta Pullman R M.D on 01/25/2016 at 7:07 PM  Between 7am to 6pm - Pager - [phone]  After 6pm go to [email or web address] - password [email or web address] [facility]  [facility] [place] [facility]  Office  [phone]  CC: Primary care physician; No PCP Per Patient  Note: This dictation was prepared with Dragon dictation along with smaller phrase technology. Any transcriptional errors that result from this process are unintentional.

## 2016-01-25 NOTE — ED Notes (Signed)
SOC machine placed in pt room by RN (felicia)

## 2016-01-26 LAB — CBC
HCT: 35.3 % — ABNORMAL LOW (ref 40.0–52.0)
Hemoglobin: 12.3 g/dL — ABNORMAL LOW (ref 13.0–18.0)
MCH: 34.2 pg — AB (ref 26.0–34.0)
MCHC: 34.7 g/dL (ref 32.0–36.0)
MCV: 98.6 fL (ref 80.0–100.0)
PLATELETS: 89 10*3/uL — AB (ref 150–440)
RBC: 3.58 MIL/uL — ABNORMAL LOW (ref 4.40–5.90)
RDW: 13.9 % (ref 11.5–14.5)
WBC: 6.8 10*3/uL (ref 3.8–10.6)

## 2016-01-26 LAB — BASIC METABOLIC PANEL
Anion gap: 7 (ref 5–15)
BUN: 7 mg/dL (ref 6–20)
CALCIUM: 8.9 mg/dL (ref 8.9–10.3)
CHLORIDE: 108 mmol/L (ref 101–111)
CO2: 25 mmol/L (ref 22–32)
CREATININE: 0.66 mg/dL (ref 0.61–1.24)
GFR calc Af Amer: >60 mL/min (ref >60)
GFR calc non Af Amer: >60 mL/min (ref >60)
Glucose, Bld: 74 mg/dL (ref 65–99)
Potassium: 3.7 mmol/L (ref 3.5–5.1)
SODIUM: 140 mmol/L (ref 135–145)

## 2016-01-26 LAB — LIPASE, BLOOD: Lipase: 61 U/L — ABNORMAL HIGH (ref 11–51)

## 2016-01-26 MED ORDER — HALOPERIDOL LACTATE 5 MG/ML IJ SOLN
2.0000 mg | Freq: Four times a day (QID) | INTRAMUSCULAR | Status: DC | PRN
  Administered 2016-01-26: 2 mg via INTRAVENOUS
  Filled 2016-01-26: qty 1

## 2016-01-26 MED ORDER — HALOPERIDOL LACTATE 5 MG/ML IJ SOLN
5.0000 mg | Freq: Once | INTRAMUSCULAR | Status: AC
  Administered 2016-01-26: 5 mg via INTRAVENOUS
  Filled 2016-01-26: qty 1

## 2016-01-26 MED ORDER — LORAZEPAM 1 MG PO TABS: 1.0000 mg | ORAL_TABLET | ORAL | Status: DC | PRN

## 2016-01-26 MED ORDER — LORAZEPAM 2 MG/ML IJ SOLN: 1.0000 mg | INTRAMUSCULAR | Status: DC | PRN

## 2016-01-26 NOTE — Progress Notes (Signed)
[facility] - [place] [facility]   PATIENT NAME: Jerry ElseWilliam Dunn    MR#:  [id number]  DATE OF BIRTH:  01-08-1981  SUBJECTIVE:  CHIEF COMPLAINT:   Chief Complaint  Patient presents with  . Hallucinations   - very confused, agitated - still has hallucinations - admitted for alcohol withdrawal  REVIEW OF SYSTEMS:  Review of Systems  Unable to perform ROS: Mental acuity    DRUG ALLERGIES:   Allergies  Allergen Reactions  . Bee Venom Anaphylaxis and Swelling  . Vicodin [Hydrocodone-Acetaminophen] Itching    VITALS:  Blood pressure (!) 144/102, pulse 96, temperature 98.3 F (36.8 C), temperature source Oral, resp. rate 18, height 5\' 7"  (1.702 m), weight 65.1 kg (143 lb 8.3 oz), SpO2 100 %.  PHYSICAL EXAMINATION:  Physical Exam  GENERAL:  36 y.o.-year-old patient lying in the bed and trying to get up  EYES: Pupils equal, round, reactive to light and accommodation. No scleral icterus. Extraocular muscles intact.  HEENT: Head atraumatic, normocephalic. Oropharynx and nasopharynx clear.  NECK:  Supple, no jugular venous distention. No thyroid enlargement, no tenderness.  LUNGS: Normal breath sounds bilaterally, no wheezing, rales,rhonchi or crepitation. No use of accessory muscles of respiration.  CARDIOVASCULAR: S1, S2 normal. No murmurs, rubs, or gallops.  ABDOMEN: Soft, nontender, nondistended. Bowel sounds present. No organomegaly or mass.  EXTREMITIES: No pedal edema, cyanosis, or clubbing.  NEUROLOGIC: agitated. Able to move all extremities in bed without any deficits. Confused, so not following commands. PSYCHIATRIC: The patient is alert and has hallucinations SKIN: No obvious rash, lesion, or ulcer.    LABORATORY PANEL:   CBC  Recent Labs Lab 01/26/16 0543  WBC 6.8  HGB 12.3*  HCT 35.3*  PLT 89*   ------------------------------------------------------------------------------------------------------------------  Chemistries    Recent Labs Lab 01/25/16 0759 01/26/16 0543  NA 132* 140  K 3.0* 3.7  CL 96* 108  CO2 24 25  GLUCOSE 94 74  BUN 8 7  CREATININE 0.75 0.66  CALCIUM 8.5* 8.9  AST 99*  --   ALT 89*  --   ALKPHOS 69  --   BILITOT 1.0  --    ------------------------------------------------------------------------------------------------------------------  Cardiac Enzymes No results for input(s): TROPONINI in the last 168 hours. ------------------------------------------------------------------------------------------------------------------  RADIOLOGY:  No results found.  EKG:   Orders placed or performed during the hospital encounter of 01/25/16  . EKG 12-Lead  . EKG 12-Lead    ASSESSMENT AND PLAN:   36 year old male with past medical history significant for bipolar disorder with anxiety, mood swings and alcohol abuse presents to hospital secondary to alcohol withdrawals.  #1 alcohol withdrawal-with delirium tremens. -Continue CIWA protocol. Also on oral Librium. -Continue Haldol as needed. Has a sitter for safety. -Psychiatry consulted  #2 acute alcoholic pancreatitis-lipase is almost close to normal. -No abdominal pain or nausea vomiting. Then alert, can be fed  #3 hypokalemia and hyponatremia-replaced appropriately.  #4 thrombocytopenia-chronic likely from alcoholic liver disease.  #5 DVT prophylaxis- TEDs and SCDs   All the records are reviewed and case discussed with Care Management/Social Workerr. Management plans discussed with the patient, family and they are in agreement.  CODE STATUS: Full Code  TOTAL TIME TAKING CARE OF THIS PATIENT: 38 minutes.   POSSIBLE D/C IN 2 DAYS, DEPENDING ON CLINICAL CONDITION.   Sundeep Destin M.D on 01/26/2016 at 1:10 PM  Between 7am to 6pm - Pager - [phone]  After 6pm go to [email or web address] - password [email or web address]  [facility]  Office  [phone]  CC: Primary care physician; No PCP Per Patient

## 2016-01-26 NOTE — Progress Notes (Signed)
Patient was offered urinal but threw it against the wall and urinated on the bed.

## 2016-01-26 NOTE — Progress Notes (Signed)
Unable to obtain ekg at this time.  Patient is too restless and uncooperative

## 2016-01-27 DIAGNOSIS — F10231 Alcohol dependence with withdrawal delirium: Principal | ICD-10-CM

## 2016-01-27 LAB — BASIC METABOLIC PANEL
Anion gap: 5 (ref 5–15)
CHLORIDE: 108 mmol/L (ref 101–111)
CO2: 22 mmol/L (ref 22–32)
CREATININE: 0.47 mg/dL — AB (ref 0.61–1.24)
Calcium: 8.8 mg/dL — ABNORMAL LOW (ref 8.9–10.3)
GFR calc Af Amer: >60 mL/min (ref >60)
GLUCOSE: 100 mg/dL — AB (ref 65–99)
POTASSIUM: 3.6 mmol/L (ref 3.5–5.1)
SODIUM: 135 mmol/L (ref 135–145)

## 2016-01-27 MED ORDER — CHLORDIAZEPOXIDE HCL 25 MG PO CAPS
25.0000 mg | ORAL_CAPSULE | Freq: Three times a day (TID) | ORAL | Status: AC
  Administered 2016-01-27 – 2016-01-28 (×3): 25 mg via ORAL
  Filled 2016-01-27 (×3): qty 1

## 2016-01-27 NOTE — Progress Notes (Signed)
Clinical Child psychotherapistocial Worker (CSW) attempted to meet with patient to provide substance abuse resources however patient was asleep and his mother Ander SladeJoy was at bedside. Sitter was also at bedside. CSW will meet with patient another time.   Baker Hughes IncorporatedBailey Mette [place], LCSW [phone]

## 2016-01-27 NOTE — Consult Note (Signed)
[place] [facility] Consult   Reason for Consult:  Consult for 36 year old man with a history of alcohol dependence and recurrent episodes of delirium tremens Referring Physician:  Sudini Patient Identification: Jerry Dunn MRN:  [id number] Principal Diagnosis: Alcohol withdrawal [facility]) Diagnosis:   Patient Active Problem List   Diagnosis Date Noted  . Major depressive disorder, recurrent episode, moderate ([place]) [F33.1] 01/24/2016  . Acute pancreatitis [K85.90] 01/22/2016  . Alcohol dependence with uncomplicated withdrawal ([place]) [F10.230]   . Alcohol dependence ([place]) [F10.20] 05/28/2015  . Alcoholic hepatitis [J69.67] 05/27/2015  . Substance induced mood disorder ([place]) [F19.94] 05/27/2015  . Bipolar 1 disorder ([place]) [F31.9] 05/27/2015  . Delirium tremens ([place]) [F10.231] 04/03/2015  . Chronic pancreatitis ([place]) [K86.1] 04/03/2015  . Seizure ([place]) [R56.9] 04/02/2015  . Alcoholic pancreatitis [E93.81] 07/31/2014  . Rectal bleeding [K62.5] 07/31/2014  . Alcohol withdrawal ([place]) [F10.239] 07/31/2014  . Hyponatremia [E87.1] 07/31/2014  . Encephalopathy acute [G93.40] 07/31/2014  . Alcohol abuse [F10.10]     Total Time spent with patient: 1 hour  Subjective:   Jerry Dunn is a 36 y.o. male patient admitted with "I'm not doing so good".  HPI:  Patient interviewed. Spoke also with his mother by telephone. Chart reviewed. Patient also known to me from previous encounters. 36 year old man with a history of alcohol abuse. He came into the hospital in the mid part of last week for alcohol detox. After about 2 days in the hospital he eloped. Patient appears to have gotten ahold of some alcohol during the time he was away from the hospital. Apparently he telephoned his mother eventually and told her that he had been discharged. Family picked him up and brought him back to the hospital. Upon returning his blood alcohol level was 144. On interview today the patient says that he is  feeling tired and worn out. He says his mood is feeling bad and depressed and negative. He denies having any wish to die or suicidal thoughts. Denies homicidal ideation. He denies being aware of any hallucinations visual or tactile or auditory today. Patient denies having had any alcohol since last week and claims to believe that he has been in the hospital continuously since the 28th.  Social history: Mother reports that the patient is essentially homeless at this point. He has worn out his welcome with a lot of his family members. He had been working at an [employer] run by his family but has not been able to keep up with work recently.  Medical history: Patient has had multiple consequences of alcohol abuse including pancreatitis and hepatic impairment area  Substance abuse history: Several years of alcohol abuse that have accelerated in the last couple years. Several presentations to the hospital intoxicated needing alcohol withdrawal. Has had full blown delirium tremens at times. Patient has been referred to rehabilitation programs and has either failed to complete the program or has relapsed very quickly after leaving the program. He denies currently that he is abusing any other drugs. Does not appear to be involved in going to any outpatient treatment.  Past Psychiatric History: Patient has been diagnosed with depression in the past although I would note that I don't think any of our assessments of imminent taken place outside of a period of acute intoxication or alcohol withdrawal. Antidepressants have been prescribed with little clear benefit or change to his long-term plan. Patient has gotten himself into very dangerous situations while intoxicated including ending up causing himself some harm.  Risk to Self: Suicidal  Ideation: No Suicidal Intent: No Is patient at risk for suicide?: No Suicidal Plan?: No Access to Means: No What has been your use of drugs/alcohol within the last 12  months?: Use of alcohol daily How many times?: 0 Other Self Harm Risks: denied /  Alcohol Use Triggers for Past Attempts: None known Intentional Self Injurious Behavior: None Risk to Others: Homicidal Ideation: No Thoughts of Harm to Others: No Current Homicidal Intent: No Current Homicidal Plan: No Access to Homicidal Means: No Identified Victim: None identified History of harm to others?: No Assessment of Violence: None Noted Does patient have access to weapons?: Yes (Comment) (Knives and guns - per patient report) Criminal Charges Pending?: No Does patient have a court date: No Prior Inpatient Therapy: Prior Inpatient Therapy: No (Patient unable to answer) Prior Therapy Dates: n/a Prior Therapy Facilty/Provider(s): n/a Reason for Treatment: n/a Prior Outpatient Therapy: Prior Outpatient Therapy: Yes Prior Therapy Dates: Current Prior Therapy Facilty/Provider(s): [facility] Reason for Treatment: Depression Does patient have an ACCT team?: No Does patient have Intensive In-House Services?  : No Does patient have [facility] services? : No Does patient have P4CC services?: No  Past Medical History:  Past Medical History:  Diagnosis Date  . Alcohol abuse   . Anxiety   . Bipolar affective ([place])   . DTs (delirium tremens) ([place])   . Pancreatitis     Past Surgical History:  Procedure Laterality Date  . none     Family History:  Family History  Problem Relation Age of Onset  . Diabetes Mother   . Hypertension Father    Family Psychiatric  History: Denies being aware of any family history Social History:  History  Alcohol Use  . 42.0 oz/week  . 52 Shots of liquor per week    Comment: 10 beers daily     History  Drug Use No    Social History   Social History  . Marital status: Single    Spouse name: N/A  . Number of children: N/A  . Years of education: N/A   Occupational History  . unemployed    Social History Main Topics  . Smoking status: Current Every Day Smoker     Packs/day: 1.00    Years: 16.00    Types: Cigarettes  . Smokeless tobacco: Never Used  . Alcohol use 42.0 oz/week    70 Shots of liquor per week     Comment: 10 beers daily  . Drug use: No  . Sexual activity: Not Currently   Other Topics Concern  . None   Social History Narrative   Lives with parents at home   Additional Social History:    Allergies:   Allergies  Allergen Reactions  . Bee Venom Anaphylaxis and Swelling  . Vicodin [Hydrocodone-Acetaminophen] Itching    Labs:  Results for orders placed or performed during the hospital encounter of 01/25/16 (from the past 48 hour(s))  Glucose, capillary     Status: Abnormal   Collection Time: 01/25/16  6:48 PM  Result Value Ref Range   Glucose-Capillary 147 (H) 65 - 99 mg/dL  Basic metabolic panel     Status: None   Collection Time: 01/26/16  5:43 AM  Result Value Ref Range   Sodium 140 135 - 145 mmol/L   Potassium 3.7 3.5 - 5.1 mmol/L   Chloride 108 101 - 111 mmol/L   CO2 25 22 - 32 mmol/L   Glucose, Bld 74 65 - 99 mg/dL   BUN 7 6 -  20 mg/dL   Creatinine, Ser 0.66 0.61 - 1.24 mg/dL   Calcium 8.9 8.9 - 10.3 mg/dL   GFR calc non Af Amer >60 >60 mL/min   GFR calc Af Amer >60 >60 mL/min    Comment: (NOTE) The eGFR has been calculated using the CKD EPI equation. This calculation has not been validated in all clinical situations. eGFR's persistently <60 mL/min signify possible Chronic Kidney Disease.    Anion gap 7 5 - 15  CBC     Status: Abnormal   Collection Time: 01/26/16  5:43 AM  Result Value Ref Range   WBC 6.8 3.8 - 10.6 K/uL   RBC 3.58 (L) 4.40 - 5.90 MIL/uL   Hemoglobin 12.3 (L) 13.0 - 18.0 g/dL   HCT 35.3 (L) 40.0 - 52.0 %   MCV 98.6 80.0 - 100.0 fL   MCH 34.2 (H) 26.0 - 34.0 pg   MCHC 34.7 32.0 - 36.0 g/dL   RDW 13.9 11.5 - 14.5 %   Platelets 89 (L) 150 - 440 K/uL  Lipase, blood     Status: Abnormal   Collection Time: 01/26/16  5:43 AM  Result Value Ref Range   Lipase 61 (H) 11 - 51 U/L   Basic metabolic panel     Status: Abnormal   Collection Time: 01/27/16  4:18 AM  Result Value Ref Range   Sodium 135 135 - 145 mmol/L   Potassium 3.6 3.5 - 5.1 mmol/L   Chloride 108 101 - 111 mmol/L   CO2 22 22 - 32 mmol/L   Glucose, Bld 100 (H) 65 - 99 mg/dL   BUN <5 (L) 6 - 20 mg/dL   Creatinine, Ser 0.47 (L) 0.61 - 1.24 mg/dL   Calcium 8.8 (L) 8.9 - 10.3 mg/dL   GFR calc non Af Amer >60 >60 mL/min   GFR calc Af Amer >60 >60 mL/min    Comment: (NOTE) The eGFR has been calculated using the CKD EPI equation. This calculation has not been validated in all clinical situations. eGFR's persistently <60 mL/min signify possible Chronic Kidney Disease.    Anion gap 5 5 - 15    Current Facility-Administered Medications  Medication Dose Route Frequency Provider Last Rate Last Dose  . 0.9 % NaCl with KCl 20 mEq/ L  infusion   Intravenous Continuous Hillary Bow, MD 125 mL/hr at 01/27/16 0225    . acetaminophen (TYLENOL) tablet 650 mg  650 mg Oral Q6H PRN Hillary Bow, MD       Or  . acetaminophen (TYLENOL) suppository 650 mg  650 mg Rectal Q6H PRN Srikar Sudini, MD      . albuterol (PROVENTIL) (2.5 MG/3ML) 0.083% nebulizer solution 2.5 mg  2.5 mg Nebulization Q2H PRN Srikar Sudini, MD      . amitriptyline (ELAVIL) tablet 25 mg  25 mg Oral Daily Hillary Bow, MD   25 mg at 01/27/16 0909  . bisacodyl (DULCOLAX) suppository 10 mg  10 mg Rectal Daily PRN Srikar Sudini, MD      . chlordiazePOXIDE (LIBRIUM) capsule 25 mg  25 mg Oral TID Hillary Bow, MD      . folic acid (FOLVITE) tablet 1 mg  1 mg Oral Daily Srikar Sudini, MD   1 mg at 01/27/16 0909  . haloperidol lactate (HALDOL) injection 2 mg  2 mg Intravenous Q6H PRN Gladstone Lighter, MD   2 mg at 01/26/16 1025  . LORazepam (ATIVAN) tablet 1 mg  1 mg Oral Q4H PRN Gladstone Lighter, MD  Or  . LORazepam (ATIVAN) injection 1 mg  1 mg Intravenous Q4H PRN Gladstone Lighter, MD      . LORazepam (ATIVAN) injection 2 mg  2 mg  Intravenous Once Delman Kitten, MD      . LORazepam (ATIVAN) tablet 0-4 mg  0-4 mg Oral Q12H Lavonia Drafts, MD      . multivitamin with minerals tablet 1 tablet  1 tablet Oral Daily Lavonia Drafts, MD   1 tablet at 01/27/16 [phone]  . ondansetron (ZOFRAN) tablet 4 mg  4 mg Oral Q6H PRN Hillary Bow, MD       Or  . ondansetron (ZOFRAN) injection 4 mg  4 mg Intravenous Q6H PRN Srikar Sudini, MD      . polyethylene glycol (MIRALAX / GLYCOLAX) packet 17 g  17 g Oral Daily PRN Srikar Sudini, MD      . sodium chloride flush (NS) 0.9 % injection 3 mL  3 mL Intravenous Q12H Srikar Sudini, MD      . thiamine (VITAMIN B-1) tablet 100 mg  100 mg Oral Daily Hillary Bow, MD   100 mg at 01/27/16 0909    Musculoskeletal: Strength & Muscle Tone: decreased Gait & Station: unable to stand Patient leans: N/A  Psychiatric Specialty Exam: Physical Exam  Nursing note and vitals reviewed. Constitutional: He appears well-developed and well-nourished.  HENT:  Head: Normocephalic and atraumatic.  Eyes: Conjunctivae are normal. Pupils are equal, round, and reactive to light.  Neck: Normal range of motion.  Cardiovascular: Regular rhythm and normal heart sounds.   Respiratory: Effort normal. No respiratory distress.  GI: Soft.  Musculoskeletal: Normal range of motion.  Neurological: He is alert.  Skin: Skin is warm and dry.  Psychiatric: His affect is blunt. His speech is delayed. He is slowed and withdrawn. He expresses impulsivity. He expresses no suicidal ideation. He exhibits abnormal recent memory.    Review of Systems  Constitutional: Positive for malaise/fatigue.  HENT: Negative.   Eyes: Negative.   Respiratory: Negative.   Cardiovascular: Negative.   Gastrointestinal: Negative.   Musculoskeletal: Negative.   Skin: Negative.   Neurological: Positive for dizziness and weakness.  Psychiatric/Behavioral: Positive for depression, memory loss and substance abuse. Negative for hallucinations and suicidal  ideas. The patient is nervous/anxious and has insomnia.     Blood pressure 122/82, pulse 71, temperature 98.2 F (36.8 C), temperature source Axillary, resp. rate 18, height '5\' 7"'$  (1.702 m), weight 63 kg (138 lb 14.4 oz), SpO2 98 %.Body mass index is 21.75 kg/m.  General Appearance: Casual  Eye Contact:  None  Speech:  Slow  Volume:  Decreased  Mood:  Depressed  Affect:  Blunt  Thought Process:  Goal Directed  Orientation:  Other:  He knows he is in the hospital but was confused as to the date and time  Thought Content:  Tangential  Suicidal Thoughts:  No  Homicidal Thoughts:  No  Memory:  Immediate;   Fair Recent;   Poor Remote;   Fair  Judgement:  Impaired  Insight:  Fair  Psychomotor Activity:  Decreased  Concentration:  Concentration: Poor  Recall:  [employer] of Knowledge:  Fair  Language:  Fair  Akathisia:  No  Handed:  Right  AIMS (if indicated):     Assets:  Communication Skills  ADL's:  Impaired  Cognition:  Impaired,  Mild  Sleep:        Treatment Plan Summary: Daily contact with patient to assess and evaluate symptoms and progress in  treatment, Medication management and Plan 36 year old man with alcohol abuse and a history of recurrent delirium tremens. Today he is denying any active hallucinations but still is sedated and withdrawn. Having gone out and gotten some alcohol on the 31st he is still currently in the window for an episode of DTs. Patient should be maintained on detox protocol and watch closely for any sign of delirium or agitation. Once he is completely detoxed we may be able to better assess what his mental state is at baseline. I don't think there is any indication to start any other psychiatric medicine at this point. I see that he is under involuntary commitment which can be continued for the time being.  Disposition: Patient does not meet criteria for psychiatric inpatient admission. Supportive therapy provided about ongoing stressors.  Alethia Berthold, MD 01/27/2016 12:35 PM

## 2016-01-27 NOTE — Progress Notes (Signed)
The Nurse asked CH to visited the Pt, but the Pt was a sleep a the time of this visit. CH to visit Pt at another appropriate time.    01/27/16 1600  Clinical Encounter Type  Visited With Patient  Visit Type Initial;Spiritual support  Referral From Nurse  Consult/Referral To Chaplain  Spiritual Encounters  Spiritual Needs Prayer;Other (Comment)

## 2016-01-27 NOTE — Progress Notes (Signed)
[employer] [facility] [place] [facility]   PATIENT NAME: Jerry Dunn    MR#:  [id number]  DATE OF BIRTH:  18-Feb-1980  SUBJECTIVE:  CHIEF COMPLAINT:   Chief Complaint  Patient presents with  . Hallucinations   More awake and alert  REVIEW OF SYSTEMS:  Review of Systems  Unable to perform ROS: Mental acuity   DRUG ALLERGIES:   Allergies  Allergen Reactions  . Bee Venom Anaphylaxis and Swelling  . Vicodin [Hydrocodone-Acetaminophen] Itching   VITALS:  Blood pressure 131/82, pulse (!) 59, temperature 97.6 F (36.4 C), temperature source Oral, resp. rate 18, height 5\' 7"  (1.702 m), weight 63 kg (138 lb 14.4 oz), SpO2 99 %.  PHYSICAL EXAMINATION:  Physical Exam  GENERAL:  36 y.o.-year-old patient lying in the bed and calm EYES: Pupils equal, round, reactive to light and accommodation. No scleral icterus. Extraocular muscles intact.  HEENT: Head atraumatic, normocephalic. Oropharynx and nasopharynx clear.  NECK:  Supple, no jugular venous distention. No thyroid enlargement, no tenderness.  LUNGS: Normal breath sounds bilaterally, no wheezing, rales,rhonchi or crepitation. No use of accessory muscles of respiration.  CARDIOVASCULAR: S1, S2 normal. No murmurs, rubs, or gallops.  ABDOMEN: Soft, nontender, nondistended. Bowel sounds present. No organomegaly or mass.  EXTREMITIES: No pedal edema, cyanosis, or clubbing.  NEUROLOGIC:  Able to move all extremities in bed  PSYCHIATRIC: The patient is alert and has hallucinations SKIN: No obvious rash, lesion, or ulcer.    LABORATORY PANEL:   CBC  Recent Labs Lab 01/26/16 0543  WBC 6.8  HGB 12.3*  HCT 35.3*  PLT 89*   ------------------------------------------------------------------------------------------------------------------  Chemistries   Recent Labs Lab 01/25/16 0759  01/27/16 0418  NA 132*  < > 135  K 3.0*  < > 3.6  CL 96*  < > 108  CO2 24  < > 22  GLUCOSE 94  < > 100*  BUN 8  < >  <5*  CREATININE 0.75  < > 0.47*  CALCIUM 8.5*  < > 8.8*  AST 99*  --   --   ALT 89*  --   --   ALKPHOS 69  --   --   BILITOT 1.0  --   --   < > = values in this interval not displayed. ------------------------------------------------------------------------------------------------------------------  Cardiac Enzymes No results for input(s): TROPONINI in the last 168 hours. ------------------------------------------------------------------------------------------------------------------  RADIOLOGY:  No results found.  EKG:   Orders placed or performed during the hospital encounter of 01/25/16  . EKG 12-Lead  . EKG 12-Lead    ASSESSMENT AND PLAN:   36 year old male with past medical history significant for bipolar disorder with anxiety, mood swings and alcohol abuse presents to hospital secondary to alcohol withdrawals.  #1 alcohol withdrawal-with delirium tremens. -Continue CIWA protocol. Also on oral Librium. -Continue Haldol as needed. Has a sitter for safety. -Psychiatry consulted and appreciate input  #2 acute alcoholic pancreatitis-lipase is almost close to normal. -No abdominal pain or nausea vomiting.   #3 hypokalemia and hyponatremia-replaced appropriately.  #4 thrombocytopenia-chronic likely from alcoholic liver disease.  #5 DVT prophylaxis- TEDs and SCDs  All the records are reviewed and case discussed with Care Management/Social Worker. Management plans discussed with the patient, family and they are in agreement.  CODE STATUS: Full Code  TOTAL TIME TAKING CARE OF THIS PATIENT: 30 minutes.   POSSIBLE D/C IN 2 DAYS, DEPENDING ON CLINICAL CONDITION.  Milagros Loll R M.D on 01/27/2016 at 3:16 PM  Between 7am to  6pm - Pager - [phone]  After 6pm go to [email or web address] - Social research officer, governmentpassword EPAS [email or web address]  [facility] [place] [facility]  Office  [phone]  CC: Primary care physician; No PCP Per Patient

## 2016-01-28 NOTE — Discharge Instructions (Signed)
Resume diet and activity as before ° °QUIT ALCOHOL °

## 2016-01-28 NOTE — Progress Notes (Signed)
Pt being discharged home today. PIV removed. IVC was discontinued, cleared by MD Clapacs. Discharge instructions reviewed with patient including educated on need to stop using alcohol. All questions were answered. He was given information on outpatient treatment centers and resources to follow up with. No new prescriptions were needed at discharge. He is leaving with all his belongings which were returned from the ED. Will be transported home via family.

## 2016-01-28 NOTE — Consult Note (Signed)
[facility] Consult   Reason for Consult:  Consult for 36 year old man with a history of alcohol dependence and recurrent episodes of delirium tremens Referring Physician:  Sudini Patient Identification: Jerry Dunn MRN:  [id number] Principal Diagnosis: Alcohol withdrawal [facility]) Diagnosis:   Patient Active Problem List   Diagnosis Date Noted  . Major depressive disorder, recurrent episode, moderate ([place]) [F33.1] 01/24/2016  . Acute pancreatitis [K85.90] 01/22/2016  . Alcohol dependence with uncomplicated withdrawal ([place]) [F10.230]   . Alcohol dependence ([place]) [F10.20] 05/28/2015  . Alcoholic hepatitis [W09.81] 05/27/2015  . Substance induced mood disorder ([place]) [F19.94] 05/27/2015  . Bipolar 1 disorder ([place]) [F31.9] 05/27/2015  . Delirium tremens ([place]) [F10.231] 04/03/2015  . Chronic pancreatitis ([place]) [K86.1] 04/03/2015  . Seizure ([place]) [R56.9] 04/02/2015  . Alcoholic pancreatitis [X91.47] 07/31/2014  . Rectal bleeding [K62.5] 07/31/2014  . Alcohol withdrawal ([place]) [F10.239] 07/31/2014  . Hyponatremia [E87.1] 07/31/2014  . Encephalopathy acute [G93.40] 07/31/2014  . Alcohol abuse [F10.10]     Total Time spent with patient: 20 minutes  Subjective:   Jerry Dunn is a 36 y.o. male patient admitted with "I'm not doing so good".  Follow-up consult for 36 year old man with alcohol dependence. Patient today is preparing for discharge. And I came to see him he was alert and oriented. Affect is still blunted and slow. He still says he is feeling flattened down but denies any suicidal thoughts. He says that he would like to stop drinking but is very superficial and flippant in his discussion of it. Does not appear to currently be hallucinating. Not delirious.  HPI:  Patient interviewed. Spoke also with his mother by telephone. Chart reviewed. Patient also known to me from previous encounters. 36 year old man with a history of alcohol abuse. He came into the  hospital in the mid part of last week for alcohol detox. After about 2 days in the hospital he eloped. Patient appears to have gotten ahold of some alcohol during the time he was away from the hospital. Apparently he telephoned his mother eventually and told her that he had been discharged. Family picked him up and brought him back to the hospital. Upon returning his blood alcohol level was 144. On interview today the patient says that he is feeling tired and worn out. He says his mood is feeling bad and depressed and negative. He denies having any wish to die or suicidal thoughts. Denies homicidal ideation. He denies being aware of any hallucinations visual or tactile or auditory today. Patient denies having had any alcohol since last week and claims to believe that he has been in the hospital continuously since the 28th.  Social history: Mother reports that the patient is essentially homeless at this point. He has worn out his welcome with a lot of his family members. He had been working at an [employer] run by his family but has not been able to keep up with work recently.  Medical history: Patient has had multiple consequences of alcohol abuse including pancreatitis and hepatic impairment area  Substance abuse history: Several years of alcohol abuse that have accelerated in the last couple years. Several presentations to the hospital intoxicated needing alcohol withdrawal. Has had full blown delirium tremens at times. Patient has been referred to rehabilitation programs and has either failed to complete the program or has relapsed very quickly after leaving the program. He denies currently that he is abusing any other drugs. Does not appear to be involved in going to any outpatient treatment.  Past Psychiatric History: Patient has been diagnosed with depression in the past although I would note that I don't think any of our assessments of imminent taken place outside of a period of acute intoxication  or alcohol withdrawal. Antidepressants have been prescribed with little clear benefit or change to his long-term plan. Patient has gotten himself into very dangerous situations while intoxicated including ending up causing himself some harm.  Risk to Self: Suicidal Ideation: No Suicidal Intent: No Is patient at risk for suicide?: No Suicidal Plan?: No Access to Means: No What has been your use of drugs/alcohol within the last 12 months?: Use of alcohol daily How many times?: 0 Other Self Harm Risks: denied /  Alcohol Use Triggers for Past Attempts: None known Intentional Self Injurious Behavior: None Risk to Others: Homicidal Ideation: No Thoughts of Harm to Others: No Current Homicidal Intent: No Current Homicidal Plan: No Access to Homicidal Means: No Identified Victim: None identified History of harm to others?: No Assessment of Violence: None Noted Does patient have access to weapons?: Yes (Comment) (Knives and guns - per patient report) Criminal Charges Pending?: No Does patient have a court date: No Prior Inpatient Therapy: Prior Inpatient Therapy: No (Patient unable to answer) Prior Therapy Dates: n/a Prior Therapy Facilty/Provider(s): n/a Reason for Treatment: n/a Prior Outpatient Therapy: Prior Outpatient Therapy: Yes Prior Therapy Dates: Current Prior Therapy Facilty/Provider(s): [facility] Reason for Treatment: Depression Does patient have an ACCT team?: No Does patient have Intensive In-House Services?  : No Does patient have [facility] services? : No Does patient have P4CC services?: No  Past Medical History:  Past Medical History:  Diagnosis Date  . Alcohol abuse   . Anxiety   . Bipolar affective ([place])   . DTs (delirium tremens) ([place])   . Pancreatitis     Past Surgical History:  Procedure Laterality Date  . none     Family History:  Family History  Problem Relation Age of Onset  . Diabetes Mother   . Hypertension Father    Family Psychiatric  History: Denies  being aware of any family history Social History:  History  Alcohol Use  . 42.0 oz/week  . 11 Shots of liquor per week    Comment: 10 beers daily     History  Drug Use No    Social History   Social History  . Marital status: Single    Spouse name: N/A  . Number of children: N/A  . Years of education: N/A   Occupational History  . unemployed    Social History Main Topics  . Smoking status: Current Every Day Smoker    Packs/day: 1.00    Years: 16.00    Types: Cigarettes  . Smokeless tobacco: Never Used  . Alcohol use 42.0 oz/week    70 Shots of liquor per week     Comment: 10 beers daily  . Drug use: No  . Sexual activity: Not Currently   Other Topics Concern  . None   Social History Narrative   Lives with parents at home   Additional Social History:    Allergies:   Allergies  Allergen Reactions  . Bee Venom Anaphylaxis and Swelling  . Vicodin [Hydrocodone-Acetaminophen] Itching    Labs:  Results for orders placed or performed during the hospital encounter of 01/25/16 (from the past 48 hour(s))  Basic metabolic panel     Status: Abnormal   Collection Time: 01/27/16  4:18 AM  Result Value Ref Range   Sodium 135  135 - 145 mmol/L   Potassium 3.6 3.5 - 5.1 mmol/L   Chloride 108 101 - 111 mmol/L   CO2 22 22 - 32 mmol/L   Glucose, Bld 100 (H) 65 - 99 mg/dL   BUN <5 (L) 6 - 20 mg/dL   Creatinine, Ser 0.47 (L) 0.61 - 1.24 mg/dL   Calcium 8.8 (L) 8.9 - 10.3 mg/dL   GFR calc non Af Amer >60 >60 mL/min   GFR calc Af Amer >60 >60 mL/min    Comment: (NOTE) The eGFR has been calculated using the CKD EPI equation. This calculation has not been validated in all clinical situations. eGFR's persistently <60 mL/min signify possible Chronic Kidney Disease.    Anion gap 5 5 - 15    Current Facility-Administered Medications  Medication Dose Route Frequency Provider Last Rate Last Dose  . 0.9 % NaCl with KCl 20 mEq/ L  infusion   Intravenous Continuous Hillary Bow, MD 125 mL/hr at 01/28/16 0730    . acetaminophen (TYLENOL) tablet 650 mg  650 mg Oral Q6H PRN Hillary Bow, MD       Or  . acetaminophen (TYLENOL) suppository 650 mg  650 mg Rectal Q6H PRN Srikar Sudini, MD      . albuterol (PROVENTIL) (2.5 MG/3ML) 0.083% nebulizer solution 2.5 mg  2.5 mg Nebulization Q2H PRN Srikar Sudini, MD      . amitriptyline (ELAVIL) tablet 25 mg  25 mg Oral Daily Hillary Bow, MD   25 mg at 01/28/16 0937  . bisacodyl (DULCOLAX) suppository 10 mg  10 mg Rectal Daily PRN Hillary Bow, MD      . folic acid (FOLVITE) tablet 1 mg  1 mg Oral Daily Srikar Sudini, MD   1 mg at 01/28/16 0937  . haloperidol lactate (HALDOL) injection 2 mg  2 mg Intravenous Q6H PRN Gladstone Lighter, MD   2 mg at 01/26/16 1025  . LORazepam (ATIVAN) tablet 1 mg  1 mg Oral Q4H PRN Gladstone Lighter, MD       Or  . LORazepam (ATIVAN) injection 1 mg  1 mg Intravenous Q4H PRN Gladstone Lighter, MD      . LORazepam (ATIVAN) injection 2 mg  2 mg Intravenous Once Delman Kitten, MD      . LORazepam (ATIVAN) tablet 0-4 mg  0-4 mg Oral Q12H Lavonia Drafts, MD   4 mg at 01/28/16 1247  . multivitamin with minerals tablet 1 tablet  1 tablet Oral Daily Lavonia Drafts, MD   1 tablet at 01/28/16 [phone]  . ondansetron (ZOFRAN) tablet 4 mg  4 mg Oral Q6H PRN Hillary Bow, MD       Or  . ondansetron (ZOFRAN) injection 4 mg  4 mg Intravenous Q6H PRN Srikar Sudini, MD      . polyethylene glycol (MIRALAX / GLYCOLAX) packet 17 g  17 g Oral Daily PRN Srikar Sudini, MD      . sodium chloride flush (NS) 0.9 % injection 3 mL  3 mL Intravenous Q12H Srikar Sudini, MD      . thiamine (VITAMIN B-1) tablet 100 mg  100 mg Oral Daily Hillary Bow, MD   100 mg at 01/28/16 5974    Musculoskeletal: Strength & Muscle Tone: decreased Gait & Station: unable to stand Patient leans: N/A  Psychiatric Specialty Exam: Physical Exam  Nursing note and vitals reviewed. Constitutional: He appears well-developed and well-nourished.   HENT:  Head: Normocephalic and atraumatic.  Eyes: Conjunctivae are normal. Pupils are equal, round, and  reactive to light.  Neck: Normal range of motion.  Cardiovascular: Regular rhythm and normal heart sounds.   Respiratory: Effort normal. No respiratory distress.  GI: Soft.  Musculoskeletal: Normal range of motion.  Neurological: He is alert.  Skin: Skin is warm and dry.  Psychiatric: His affect is blunt. His speech is delayed. He is slowed and withdrawn. He expresses impulsivity. He expresses no suicidal ideation. He exhibits abnormal recent memory.    Review of Systems  Constitutional: Positive for malaise/fatigue.  HENT: Negative.   Eyes: Negative.   Respiratory: Negative.   Cardiovascular: Negative.   Gastrointestinal: Negative.   Musculoskeletal: Negative.   Skin: Negative.   Neurological: Positive for dizziness and weakness.  Psychiatric/Behavioral: Positive for depression, memory loss and substance abuse. Negative for hallucinations and suicidal ideas. The patient is nervous/anxious and has insomnia.     Blood pressure (!) 147/93, pulse 61, temperature 97.5 F (36.4 C), temperature source Oral, resp. rate 18, height _0  (1.702 m), weight 62.1 kg (136 lb 12.8 oz), SpO2 99 %.Body mass index is 21.43 kg/m.  General Appearance: Casual  Eye Contact:  None  Speech:  Slow  Volume:  Decreased  Mood:  Depressed  Affect:  Blunt  Thought Process:  Goal Directed  Orientation:  Other:  He knows he is in the hospital but was confused as to the date and time  Thought Content:  Tangential  Suicidal Thoughts:  No  Homicidal Thoughts:  No  Memory:  Immediate;   Fair Recent;   Poor Remote;   Fair  Judgement:  Impaired  Insight:  Fair  Psychomotor Activity:  Decreased  Concentration:  Concentration: Poor  Recall:  [employer] of Knowledge:  Fair  Language:  Fair  Akathisia:  No  Handed:  Right  AIMS (if indicated):     Assets:  Communication Skills  ADL's:  Impaired   Cognition:  Impaired,  Mild  Sleep:        Treatment Plan Summary: Daily contact with patient to assess and evaluate symptoms and progress in treatment, Medication management and Plan Counseled patient about his alcohol abuse and how it is going to lead to his further disability and death one way or another. Counseled patient to get involved daily with alcoholics anonymous groups and to go to [place] and see a counselor regularly and to stop drinking entirely. Patient expresses an understanding of this. No grounds for commitment any further. Discontinued IVC. Patient is stating that he plans to go home to stay with his family. I don't think there is any indication to add any extra medication at this point as antidepressants have not been helpful in the past and are not likely to be helpful in C is consistently making an effort to stop drinking.  Disposition: Patient does not meet criteria for psychiatric inpatient admission. Supportive therapy provided about ongoing stressors.  Alethia Berthold, MD 01/28/2016 3:04 PM

## 2016-01-28 NOTE — Progress Notes (Signed)
Clinical Child psychotherapistocial Worker (CSW) attempted to meet with patient to provide substance abuse resources. Patient reported that he did not want to speak to CSW and asked CSW to leave. CSW left substance abuse resources at bedside. Patient reported that he did not want to resources. CSW left resources in the room. Please reconsult if future social work needs arise. CSW signing off.   Baker Hughes IncorporatedBailey Cassidee Deats, LCSW [phone]

## 2016-01-30 NOTE — Discharge Summary (Signed)
Jerry ElseWilliam Morgenstern, is a 36 y.o. male  DOB [birth year]  MRN [id number].  Admission date:  01/22/2016  Admitting Physician  Shaune PollackQing Chen, MD  Discharge Date:  01/25/2016   Primary MD  No PCP Per Patient  Recommendations for primary care physician for things to follow:   Signed out AMA. On 12/31 /17 .  nurse found patient missing from the room.   Admission Diagnosis  Alcohol abuse [F10.10] Alcohol-induced acute pancreatitis, unspecified complication status [K85.20]   Discharge Diagnosis  Alcohol abuse [F10.10] Alcohol-induced acute pancreatitis, unspecified complication status [K85.20]    Principal Problem:   Major depressive disorder, recurrent episode, moderate (HCC) Active Problems:   Substance induced mood disorder (HCC)   Alcohol dependence with uncomplicated withdrawal (H[facility])   Acute pancreatitis      Past Medical History:  Diagnosis Date  . Alcohol abuse   . Anxiety   . Bipolar affective (HCC)   . DTs (delirium tremens) (HCC)   . Pancreatitis     Past Surgical History:  Procedure Laterality Date  . none         History of present illness and  Hospital Course:     Kindly see H&P for history of present illness and admission details, please review complete Labs, Consult reports and Test reports for all details in brief  HPI  from the history and physical done on the day of admission 36 year old male patient with history of alcohol abuse, pancreatitis admitted 12/ 28/2017 because of abdominal pain, rated Lipan up to 915 nausea, vomiting, alcoholic pancreatitis.   Hospital Course   Acute  abdominal pain , and lipase up to 915  on December 28. secondary to alcoholic pancreatitis. Admitted to medical service, started on IV fluids, IV pain control, Zofran, , IV PPIs. Lipase him down with conservative  treatment, lipase 167 on December 30. Abdominal pain improved. Nausea vomiting improved. Started on full liquid diet.  #2 alcohol abuse: Patient also had alcoholic hepatitis with elevated AST, ALT up to AST of 505 ALT 315. Patient LFTs  Improved  on December 30. Patient had some withdrawal symptoms during this hospitalization requiring Ativan. And patient advised to quit alcohol, provided medication about a programs. Seen by psychiatry DR.Pucilowska for major depression, started on Elavil for depression. Pt. Became missing from room. On December 31 at 5:30 AM. Code walker called. Called emergency contact and discovered pt. Was at his mothers home. Spoke with pt. And mother. Suggested pt. Come back to hospital. Spoke with dr. Sheryle Hailiamond and if he returns to emergency room he will be involuntary committed.      Discharge Condition: unknown   Follow UP      Discharge Instructions  and  Discharge Medications      Allergies as of 01/25/2016      Reactions   Bee Venom Anaphylaxis, Swelling   Vicodin [hydrocodone-acetaminophen] Itching      Medication List    ASK your doctor about these medications   citalopram 20 MG tablet Commonly known as:  CELEXA Take 1 tablet (20 mg total) by mouth daily. For depression   hydrOXYzine 25 MG tablet Commonly known as:  ATARAX/VISTARIL Take 1 tablet (25 mg) four times daily as needed: For anxiety   ibuprofen 200 MG tablet Commonly known as:  ADVIL,MOTRIN Take 600 mg by mouth every 6 (six) hours as needed.   multivitamin with minerals tablet Take 1 tablet by mouth daily. For low Vitamin         Diet and  Activity recommendation: See Discharge Instructions above   Consults obtained - Psychiatriy   Major procedures and Radiology Reports - PLEASE review detailed and final reports for all details, in brief -      No results found.  Micro Results    No results found for this or any previous visit (from the past 240  hour(s)).     Today   Subjective:   Jerry Dunn saw the patient on December 30 morning. Please review my note.,  Objective:   Blood pressure 121/89, pulse 83, temperature 98.7 F (37.1 C), temperature source Oral, resp. rate 18, height 5\' 7"  (1.702 m), weight 65.8 kg (145 lb 1.6 oz), SpO2 100 %.  No intake or output data in the 24 hours ending 01/30/16 1338  Exam  Physical  exam from December 30 at 10:30 AM.  Awake Alert, Oriented x 3, No new F.N deficits, Normal affect [place].AT,PERRAL Supple Neck,No JVD, No cervical lymphadenopathy appriciated.  Symmetrical Chest wall movement, Good air movement bilaterally, CTAB RRR,No Gallops,Rubs or new Murmurs, No Parasternal Heave +ve B.Sounds, Abd Soft, Non tender, No organomegaly appriciated, No rebound -guarding or rigidity. No Cyanosis, Clubbing or edema, No new Rash or bruise  Data Review   CBC w Diff:  Lab Results  Component Value Date   WBC 6.8 01/26/2016   HGB 12.3 (L) 01/26/2016   HCT 35.3 (L) 01/26/2016   PLT 89 (L) 01/26/2016   LYMPHOPCT 41 08/14/2014   MONOPCT 6 08/14/2014   EOSPCT 1 08/14/2014   BASOPCT 2 (H) 08/14/2014    CMP:  Lab Results  Component Value Date   NA 135 01/27/2016   K 3.6 01/27/2016   CL 108 01/27/2016   CO2 22 01/27/2016   BUN <5 (L) 01/27/2016   CREATININE 0.47 (L) 01/27/2016   CREATININE 0.95 08/14/2014   PROT 8.6 (H) 01/25/2016   ALBUMIN 4.3 01/25/2016   BILITOT 1.0 01/25/2016   ALKPHOS 69 01/25/2016   AST 99 (H) 01/25/2016   ALT 89 (H) 01/25/2016  .   Total Time in preparing paper work, data evaluation and todays exam - 35 minutes  Jerry Dunn M.D on 01/25/2016 at 1:38 PM    Note: This dictation was prepared with Dragon dictation along with smaller phrase technology. Any transcriptional errors that result from this process are unintentional.

## 2016-02-03 NOTE — Discharge Summary (Signed)
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 Alcohol abuse   . Anxiety   . Bipolar affective (HCC)   . DTs (delirium tremens) (HCC)   . Pancreatitis      ADMITTING HISTORY  Jerry Dunn  is a 36 y.o. male with a known history of Anxiety, alcohol abuse who was recently admitted to the hospital for acute pancreatitis and alcohol withdrawal had eloped from the hospital yesterday. He was found to be acting bizarre by family and EMS called. Patient has been in the emergency room with involuntary commitment. He has been noticed to have delirium tremens with significant hallucinations needing high doses of IV Ativan and is being admitted to the hospital.  He is confused at this time. Poor historian. Does not complain of any abdominal pain. Tells me he just got out of the hospital after having multiple dental surgeries.   HOSPITAL COURSE:   36 year old male with past medical history significant for bipolar disorder with anxiety, mood swings and alcohol abuse presents to hospital secondary to alcohol withdrawals.  #1 alcohol withdrawal-with delirium tremens. - Placed on Librium and CIWA protocol. Patient improved well. By the day of discharge he is not needing any benzos. Alert and oriented 3. IVC discontinued by psychiatry Dr. Toni Amendlapacs. -Use Haldol as needed. Has a  sitter for safety. Patient did not need any sitter or Haldol during his last 24 hours stay in the hospital. -Psychiatry consulted and Discussed with Dr. Toni Amendlapacs.  #2 acute alcoholic pancreatitis-lipase is almost close to normal. -No abdominal pain or nausea vomiting.   #3 hypokalemia and hyponatremia-replaced appropriately.  #4 thrombocytopenia-chronic likely from alcoholic liver disease. Stable. No bleeding.  #5 DVT prophylaxis- TEDs and SCDs  Stable for discharge home. Counseled to quit alcohol.  CONSULTS OBTAINED:  Treatment Team:  Audery AmelJohn T Clapacs, MD  DRUG ALLERGIES:   Allergies  Allergen Reactions  . Bee Venom Anaphylaxis and Swelling  . Vicodin [Hydrocodone-Acetaminophen] Itching    DISCHARGE MEDICATIONS:   Discharge Medication List as of 01/28/2016  2:30 PM    CONTINUE these medications which have NOT CHANGED   Details  Multiple Vitamins-Minerals (MULTIVITAMIN WITH MINERALS) tablet Take 1 tablet by mouth daily. For low Vitamin, Starting 05/30/2015, Until Discontinued, No Print    amitriptyline (ELAVIL) 25 MG tablet Take 25 mg by mouth daily., Historical Med    citalopram (CELEXA) 20 MG tablet Take 1 tablet (20 mg total) by mouth daily. For depression, Starting 05/30/2015, Until Discontinued, Normal    hydrOXYzine (ATARAX/VISTARIL) 25 MG tablet Take 1 tablet (25 mg) four times daily as needed: For anxiety, Normal    ibuprofen (ADVIL,MOTRIN) 200 MG tablet Take 600 mg by mouth every 6 (six) hours as needed., Historical Med      STOP taking these medications     ondansetron (ZOFRAN ODT) 4 MG disintegrating tablet      oxyCODONE-acetaminophen (ROXICET) 5-325 MG tablet  Today   VITAL SIGNS:  Blood pressure (!) 147/93, pulse 61, temperature 97.5 F (36.4 C), temperature source Oral, resp. rate 18, height 5\' 7"  (1.702 m), weight 62.1 kg (136 lb 12.8 oz), SpO2 99 %.  I/O:  No intake or output data in the 24 hours ending 02/03/16 1212  PHYSICAL  EXAMINATION:  Physical Exam  GENERAL:  36 y.o.-year-old patient lying in the bed with no acute distress.  LUNGS: Normal breath sounds bilaterally, no wheezing, rales,rhonchi or crepitation. No use of accessory muscles of respiration.  CARDIOVASCULAR: S1, S2 normal. No murmurs, rubs, or gallops.  ABDOMEN: Soft, non-tender, non-distended. Bowel sounds present. No organomegaly or mass.  NEUROLOGIC: Moves all 4 extremities. PSYCHIATRIC: The patient is alert and oriented x 3.  SKIN: No obvious rash, lesion, or ulcer.   DATA REVIEW:   CBC No results for input(s): WBC, HGB, HCT, PLT in the last 168 hours.  Chemistries  No results for input(s): NA, K, CL, CO2, GLUCOSE, BUN, CREATININE, CALCIUM, MG, AST, ALT, ALKPHOS, BILITOT in the last 168 hours.  Invalid input(s): GFRCGP  Cardiac Enzymes No results for input(s): TROPONINI in the last 168 hours.  Microbiology Results  Results for orders placed or performed during the hospital encounter of 04/01/15  MRSA PCR Screening     Status: None   Collection Time: 04/05/15  2:04 PM  Result Value Ref Range Status   MRSA by PCR NEGATIVE NEGATIVE Final    Comment:        The GeneXpert MRSA Assay (FDA approved for NASAL specimens only), is one component of a comprehensive MRSA colonization surveillance program. It is not intended to diagnose MRSA infection nor to guide or monitor treatment for MRSA infections.     RADIOLOGY:  No results found.  Follow up with PCP in 1 week.  Management plans discussed with the patient, family and they are in agreement.  CODE STATUS:  Code Status History    Date Active Date Inactive Code Status Order ID Comments User Context   01/25/2016  7:04 PM 01/28/2016  6:53 PM Full Code [id number]  Milagros Loll, MD ED   01/22/2016 10:07 PM 01/25/2016  6:46 AM Full Code [id number]  Shaune Pollack, MD ED   06/02/2015  4:40 AM 06/02/2015  8:24 PM Full Code [id number]  Rebecka Apley, MD ED   05/27/2015 11:55 PM 05/31/2015 12:30  AM Full Code [id number]  Claiborne Rigg, RN Inpatient   04/02/2015  4:17 AM 04/08/2015  6:16 PM Full Code [id number]  Ihor Austin, MD ED   04/01/2015 11:35 PM 04/02/2015  4:17 AM Full Code [id number]  Rebecka Apley, MD ED   07/31/2014 10:49 AM 08/07/2014  3:19 PM Full Code [id number]  Ron Parker, MD Inpatient      TOTAL TIME TAKING CARE OF THIS PATIENT ON DAY OF DISCHARGE: more than 30 minutes.   Milagros Loll R M.D on 02/03/2016 at 12:12 PM  Between 7am to 6pm - Pager - [phone]  After 6pm go to [email or web address] - password [email or web address] [facility]  [facility]  Office  [phone]  CC: Primary care physician; No PCP Per Patient  Note: This dictation was prepared with Dragon dictation along with smaller phrase technology. Any transcriptional errors that result from this process are unintentional.

## 2016-11-21 IMAGING — CT CT MAXILLOFACIAL W/O CM
1 series · 15 of 30 positions shown, 19 images · non-contrast
Comparison: CT head 09/17/2006

CLINICAL DATA: Multiple seizures today. Struck head. Abrasions to
the face and tongue. Patient recently stopped drinking trying to
detox.

EXAM:
CT HEAD WITHOUT CONTRAST
CT MAXILLOFACIAL WITHOUT CONTRAST
TECHNIQUE: Multidetector CT imaging of the head and maxillofacial structures
were performed using the standard protocol without intravenous
contrast. Multiplanar CT image reconstructions of the maxillofacial
structures were also generated.

[Series 2: head wo · axial · 0.44mm/px · z∈[+419,+563]mm · 15 of 36 slices shown, 19 images]
[im 2/36  brain]
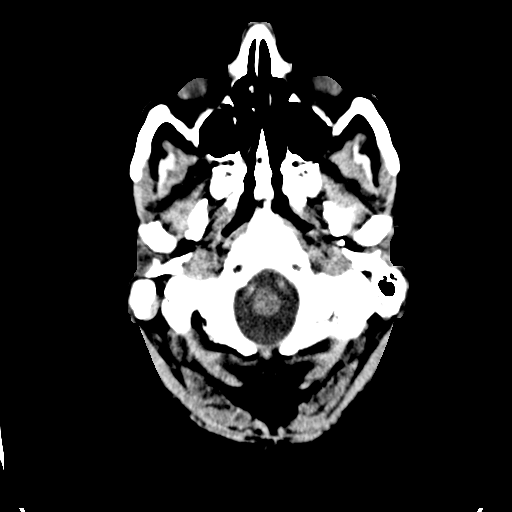
[im 2/36  bone]
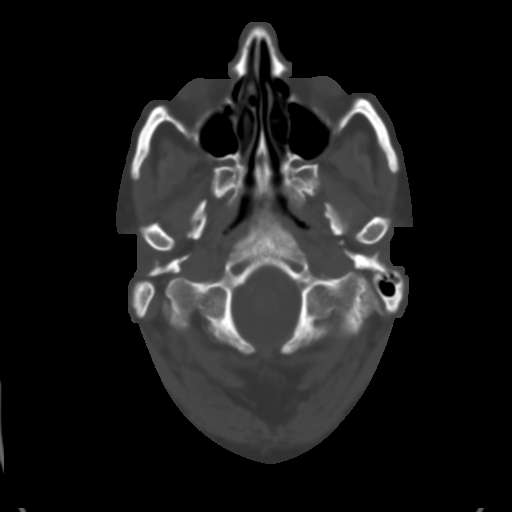
[im 4/36  bone]
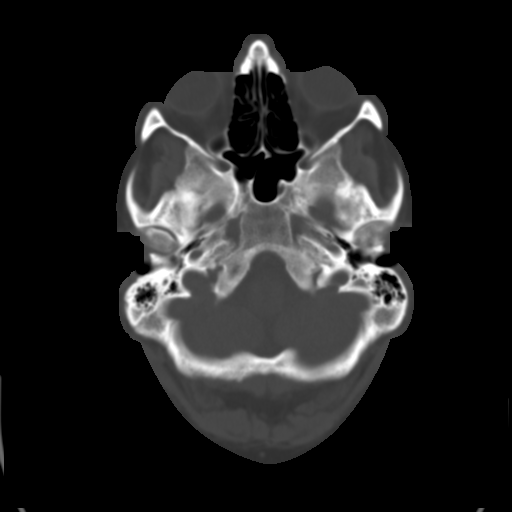
[im 7/36  bone]
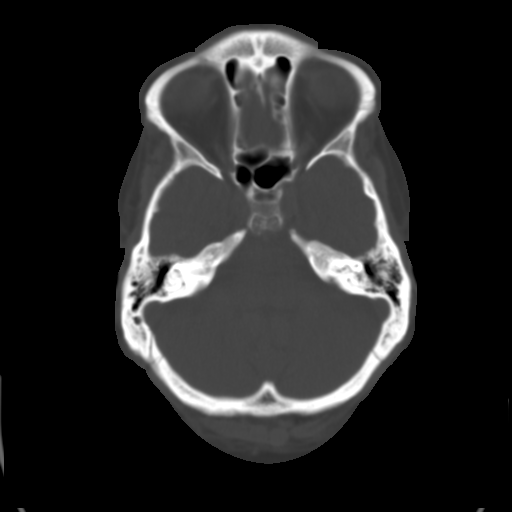
[im 9/36  bone]
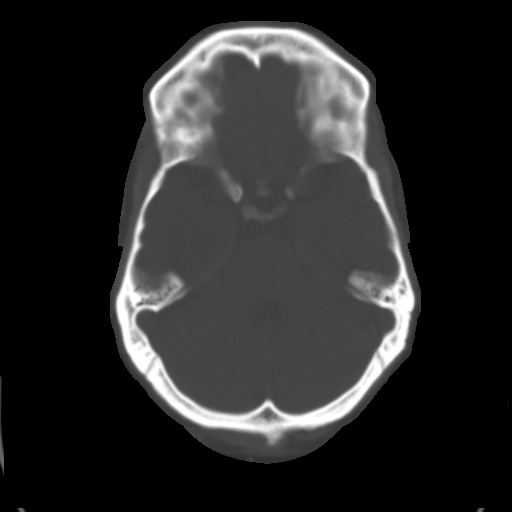
[im 11/36  brain]
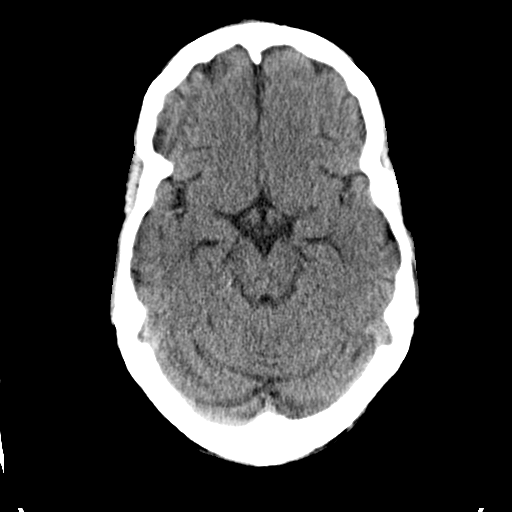
[im 11/36  bone]
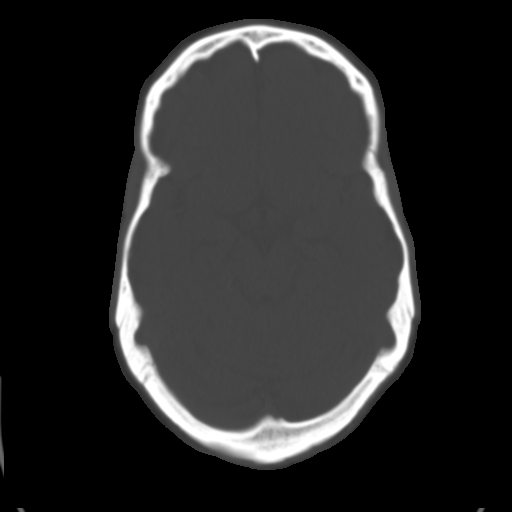
[im 14/36  bone]
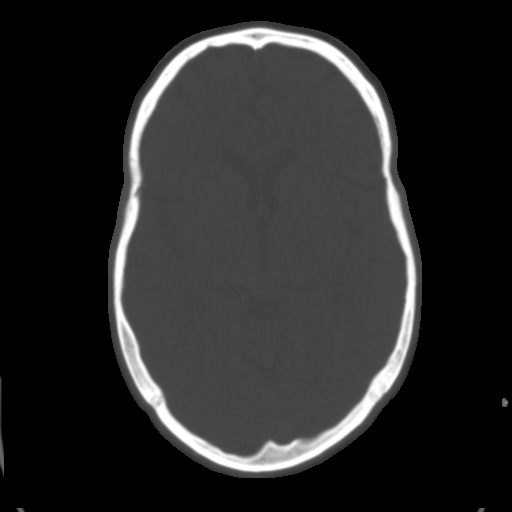
[im 16/36  bone]
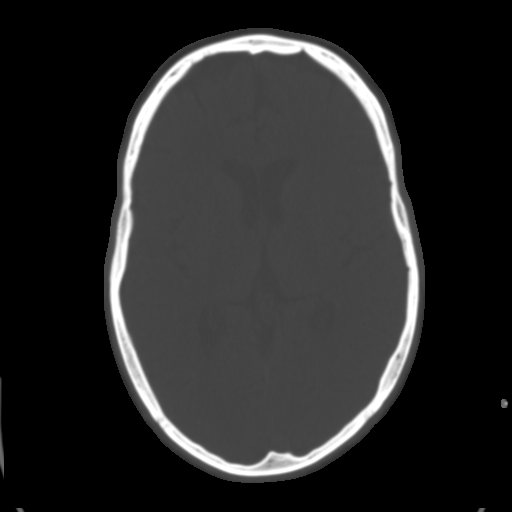
[im 19/36  bone]
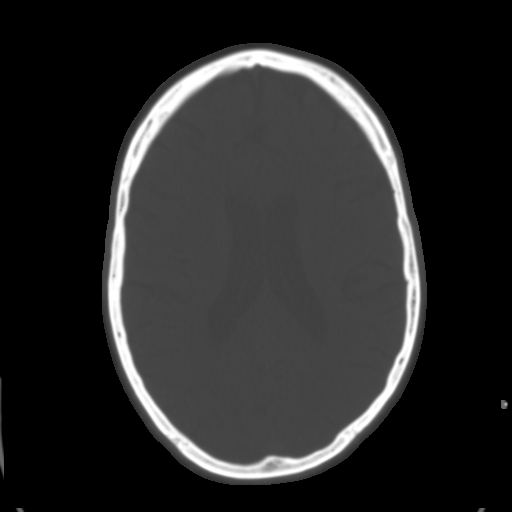
[im 20/36  brain]
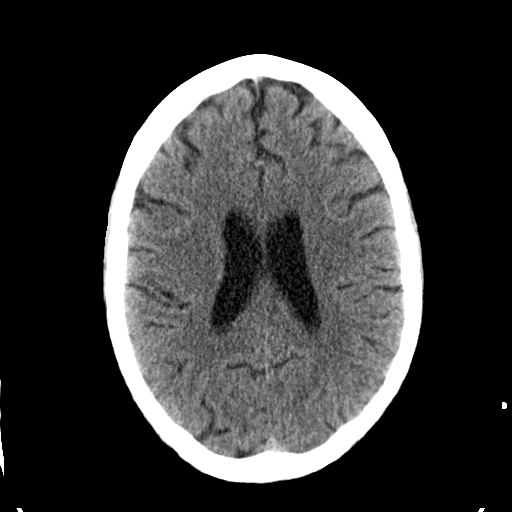
[im 20/36  bone]
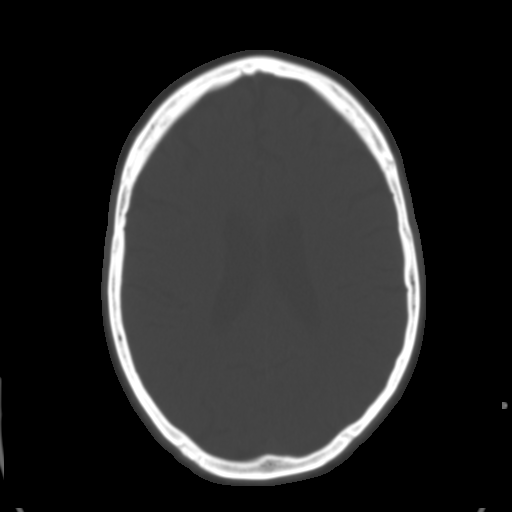
[im 22/36  bone]
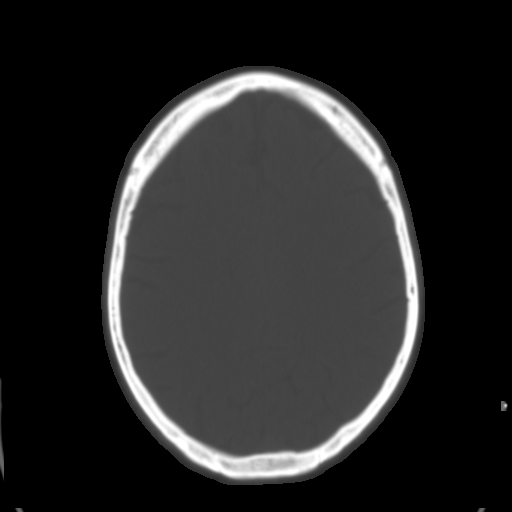
[im 25/36  bone]
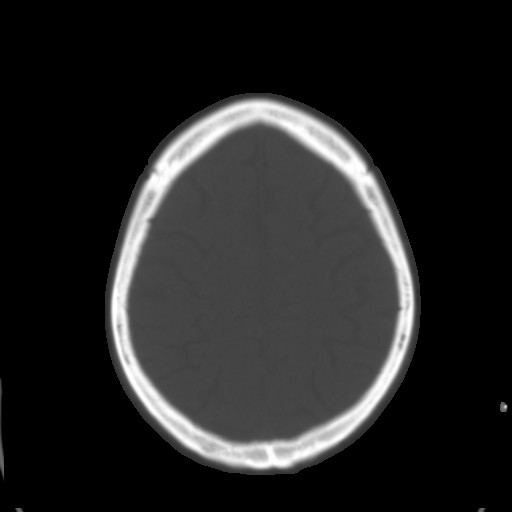
[im 27/36  bone]
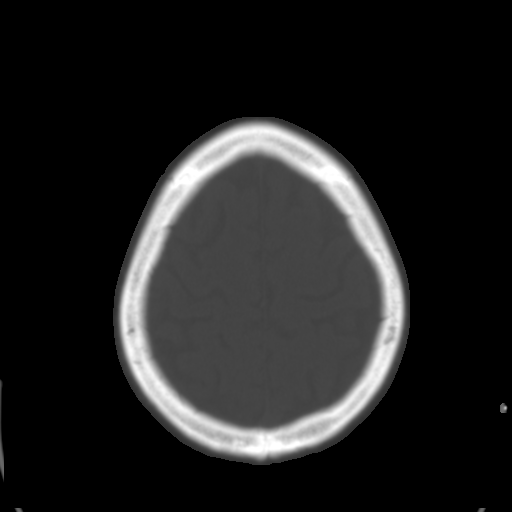
[im 29/36  brain]
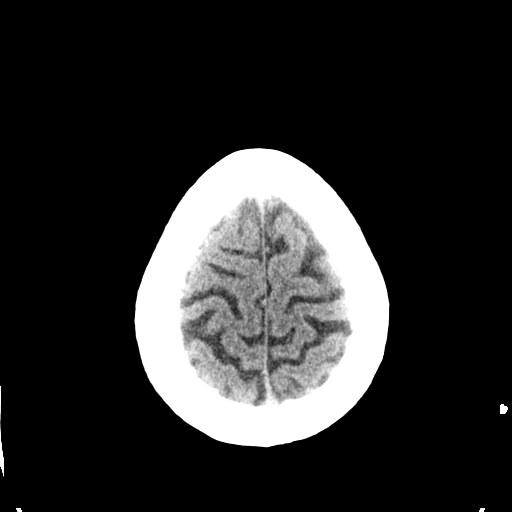
[im 29/36  bone]
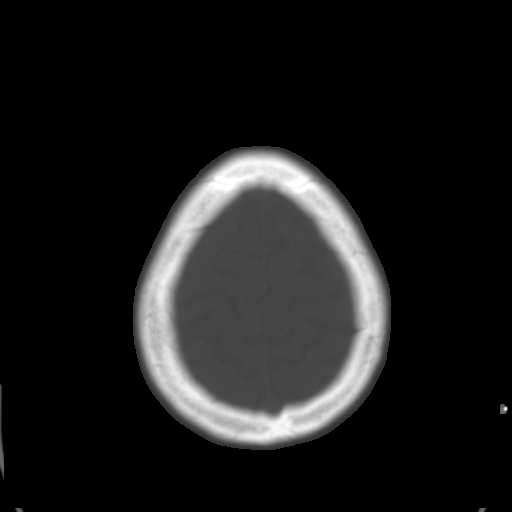
[im 32/36  bone]
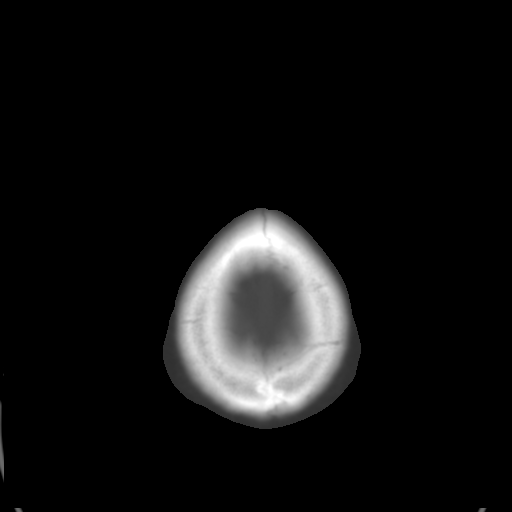
[im 34/36  bone]
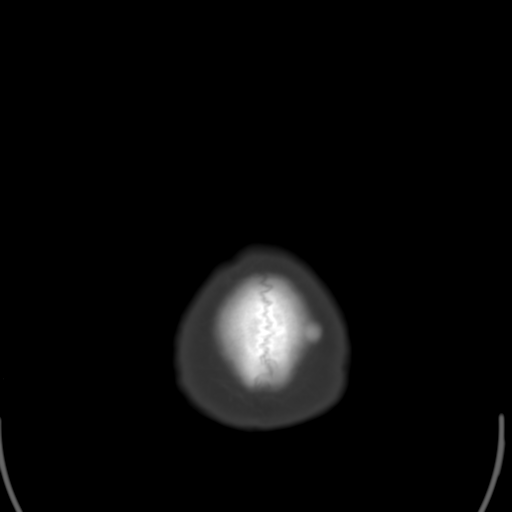

[15 of 30 positions shown; findings below may reference images not displayed]

FINDINGS: CT HEAD FINDINGS

The ventricles and sulci appear symmetrical. No significant
ventricular dilatation. No mass effect or midline shift. No abnormal
extra-axial fluid collections. Gray-white matter junctions are
distinct. Basal cisterns are not effaced. No evidence of acute
intracranial hemorrhage. No depressed skull fractures. Visualized
paranasal sinuses and mastoid air cells are not opacified.

CT MAXILLOFACIAL FINDINGS

The globes and extraocular muscles appear intact and symmetrical.
Mucosal thickening in the floor of the right maxillary antrum.
Paranasal sinuses are otherwise clear. No acute air-fluid levels.
The frontal bones, orbital rims, nasal bones, maxillary antral
walls, zygomatic arches, pterygoid plates, temporomandibular joints,
and mandibles appear intact. No acute displaced fractures are
identified. Previous tooth extractions. Circumscribed cystic lesion
in the subcutaneous fat inferior to the left mandible measuring
cm maximal diameter. This likely represents a sebaceous cyst.
IMPRESSION: No acute intracranial abnormalities.

No acute displaced orbital or facial fractures identified.

## 2018-12-26 DEATH — deceased
